# Patient Record
Sex: Male | Born: 1960 | Race: White | Hispanic: No | Marital: Married | Smoking: Never smoker
Health system: Southern US, Community
[De-identification: ages and names within clinical notes are randomized; demographics above are authoritative.]

## PROBLEM LIST (undated history)

## (undated) DIAGNOSIS — A630 Anogenital (venereal) warts: Secondary | ICD-10-CM

## (undated) DIAGNOSIS — G4733 Obstructive sleep apnea (adult) (pediatric): Secondary | ICD-10-CM

## (undated) DIAGNOSIS — R519 Headache, unspecified: Secondary | ICD-10-CM

## (undated) DIAGNOSIS — B019 Varicella without complication: Secondary | ICD-10-CM

## (undated) DIAGNOSIS — T7840XA Allergy, unspecified, initial encounter: Secondary | ICD-10-CM

## (undated) DIAGNOSIS — E291 Testicular hypofunction: Secondary | ICD-10-CM

## (undated) DIAGNOSIS — D126 Benign neoplasm of colon, unspecified: Secondary | ICD-10-CM

## (undated) DIAGNOSIS — K921 Melena: Secondary | ICD-10-CM

## (undated) DIAGNOSIS — J45909 Unspecified asthma, uncomplicated: Secondary | ICD-10-CM

## (undated) DIAGNOSIS — G4700 Insomnia, unspecified: Secondary | ICD-10-CM

## (undated) DIAGNOSIS — K635 Polyp of colon: Secondary | ICD-10-CM

## (undated) DIAGNOSIS — Z9989 Dependence on other enabling machines and devices: Secondary | ICD-10-CM

## (undated) DIAGNOSIS — I2699 Other pulmonary embolism without acute cor pulmonale: Secondary | ICD-10-CM

## (undated) DIAGNOSIS — E785 Hyperlipidemia, unspecified: Secondary | ICD-10-CM

## (undated) DIAGNOSIS — E119 Type 2 diabetes mellitus without complications: Secondary | ICD-10-CM

## (undated) DIAGNOSIS — K5790 Diverticulosis of intestine, part unspecified, without perforation or abscess without bleeding: Secondary | ICD-10-CM

## (undated) DIAGNOSIS — K649 Unspecified hemorrhoids: Secondary | ICD-10-CM

## (undated) DIAGNOSIS — K219 Gastro-esophageal reflux disease without esophagitis: Secondary | ICD-10-CM

## (undated) DIAGNOSIS — I10 Essential (primary) hypertension: Secondary | ICD-10-CM

## (undated) DIAGNOSIS — R51 Headache: Secondary | ICD-10-CM

## (undated) DIAGNOSIS — E079 Disorder of thyroid, unspecified: Secondary | ICD-10-CM

## (undated) HISTORY — DX: Varicella without complication: B01.9

## (undated) HISTORY — DX: Dependence on other enabling machines and devices: Z99.89

## (undated) HISTORY — DX: Allergy, unspecified, initial encounter: T78.40XA

## (undated) HISTORY — DX: Disorder of thyroid, unspecified: E07.9

## (undated) HISTORY — DX: Polyp of colon: K63.5

## (undated) HISTORY — DX: Gastro-esophageal reflux disease without esophagitis: K21.9

## (undated) HISTORY — PX: ANKLE SURGERY: SHX546

## (undated) HISTORY — DX: Other pulmonary embolism without acute cor pulmonale: I26.99

## (undated) HISTORY — DX: Benign neoplasm of colon, unspecified: D12.6

## (undated) HISTORY — DX: Type 2 diabetes mellitus without complications: E11.9

## (undated) HISTORY — PX: ESOPHAGOGASTRODUODENOSCOPY: SHX1529

## (undated) HISTORY — DX: Hyperlipidemia, unspecified: E78.5

## (undated) HISTORY — DX: Obstructive sleep apnea (adult) (pediatric): G47.33

## (undated) HISTORY — DX: Melena: K92.1

## (undated) HISTORY — DX: Insomnia, unspecified: G47.00

## (undated) HISTORY — DX: Diverticulosis of intestine, part unspecified, without perforation or abscess without bleeding: K57.90

## (undated) HISTORY — DX: Testicular hypofunction: E29.1

## (undated) HISTORY — DX: Anogenital (venereal) warts: A63.0

## (undated) HISTORY — DX: Headache, unspecified: R51.9

## (undated) HISTORY — DX: Headache: R51

## (undated) HISTORY — DX: Unspecified hemorrhoids: K64.9

---

## 2000-03-12 ENCOUNTER — Ambulatory Visit (HOSPITAL_BASED_OUTPATIENT_CLINIC_OR_DEPARTMENT_OTHER): Admission: RE | Admit: 2000-03-12 | Discharge: 2000-03-12 | Payer: Self-pay | Admitting: Orthopaedic Surgery

## 2004-03-14 ENCOUNTER — Ambulatory Visit (HOSPITAL_COMMUNITY): Admission: RE | Admit: 2004-03-14 | Discharge: 2004-03-14 | Payer: Self-pay | Admitting: Gastroenterology

## 2012-08-21 ENCOUNTER — Encounter (HOSPITAL_BASED_OUTPATIENT_CLINIC_OR_DEPARTMENT_OTHER): Payer: Self-pay | Admitting: Emergency Medicine

## 2012-08-21 ENCOUNTER — Emergency Department (HOSPITAL_BASED_OUTPATIENT_CLINIC_OR_DEPARTMENT_OTHER)
Admission: EM | Admit: 2012-08-21 | Discharge: 2012-08-22 | Disposition: A | Discharge status: Home / Self Care | Payer: BC Managed Care – PPO | Attending: Emergency Medicine | Admitting: Emergency Medicine

## 2012-08-21 DIAGNOSIS — K5732 Diverticulitis of large intestine without perforation or abscess without bleeding: Secondary | ICD-10-CM | POA: Insufficient documentation

## 2012-08-21 DIAGNOSIS — K5792 Diverticulitis of intestine, part unspecified, without perforation or abscess without bleeding: Secondary | ICD-10-CM

## 2012-08-21 DIAGNOSIS — I10 Essential (primary) hypertension: Secondary | ICD-10-CM | POA: Insufficient documentation

## 2012-08-21 DIAGNOSIS — Z79899 Other long term (current) drug therapy: Secondary | ICD-10-CM | POA: Insufficient documentation

## 2012-08-21 HISTORY — DX: Essential (primary) hypertension: I10

## 2012-08-21 MED ORDER — SODIUM CHLORIDE 0.9 % IV SOLN
INTRAVENOUS | Status: DC
  Administered 2012-08-22: via INTRAVENOUS

## 2012-08-21 NOTE — ED Notes (Signed)
Pt c/o LLQ abd pain x 5-6 hours with nausea. Denies vomiting and diarrhea today. Pt states he did have diarrhea the two previous days.

## 2012-08-21 NOTE — ED Notes (Signed)
Pt provided urinal to obtain urine specimen.  Pt requesting water but informed patient needed to wait until testing completed and ok'd by MD.

## 2012-08-21 NOTE — ED Provider Notes (Addendum)
History     CSN: [id number]  Arrival date & time 08/21/12  2341   First MD Initiated Contact with Patient 08/21/12 2353      Chief Complaint  Patient presents with  . Abdominal Pain    (Consider location/radiation/quality/duration/timing/severity/associated sxs/prior treatment) HPI This is a 52 year old male with a 5 or 6 hour history of left lower quadrant pain. He describes the pain is sharp and of fairly rapid onset. It is worse with movement or palpation. There is no associated chills but no fever. He denies dysuria or hematuria. He has some nausea but no vomiting. He did have diarrhea yesterday. The pain is about a 5/10 at this time.  Past Medical History  Diagnosis Date  . Hypertension     Past Surgical History  Procedure Date  . Ankle surgery     History reviewed. No pertinent family history.  History  Substance Use Topics  . Smoking status: Never Smoker   . Smokeless tobacco: Not on file  . Alcohol Use: Yes      Review of Systems  All other systems reviewed and are negative.    Allergies  Review of patient's allergies indicates no known allergies.  Home Medications   Current Outpatient Rx  Name  Route  Sig  Dispense  Refill  . BISMUTH SUBSALICYLATE 262 MG/15ML PO SUSP   Oral   Take 15 mLs by mouth as needed.         Marland Kitchen DM-GUAIFENESIN ER 30-600 MG PO TB12   Oral   Take 1 tablet by mouth as needed.         . IBUPROFEN 200 MG PO TABS   Oral   Take 200 mg by mouth once.         Marland Kitchen LISINOPRIL-HYDROCHLOROTHIAZIDE 20-12.5 MG PO TABS   Oral   Take 1 tablet by mouth daily.           BP 144/95  Pulse 90  Temp 98.5 F (36.9 C) (Oral)  Resp 18  Ht 6\' 1"  (1.854 m)  Wt 320 lb (145.151 kg)  BMI 42.22 kg/m2  SpO2 95%  Physical Exam General: Well-developed, well-nourished male in no acute distress; appearance consistent with age of record HENT: normocephalic, atraumatic Eyes: pupils equal round and reactive to light; extraocular muscles  intact Neck: supple Heart: regular rate and rhythm Lungs: clear to auscultation bilaterally Abdomen: soft; obese; left lower quadrant tenderness; bowel sounds present Extremities: No deformity; full range of motion Neurologic: Awake, alert and oriented; motor function intact in all extremities and symmetric; no facial droop Skin: Warm and dry Psychiatric: Normal mood and affect    ED Course  Procedures (including critical care time)    MDM   Nursing notes and vitals signs, including pulse oximetry, reviewed.  Summary of this visit's results, reviewed by myself:  Labs:  Results for orders placed during the hospital encounter of 08/21/12 (from the past 24 hour(s))  URINALYSIS, ROUTINE W REFLEX MICROSCOPIC     Status: Abnormal   Collection Time   08/22/12 12:02 AM      Component Value Range   Color, Urine YELLOW  YELLOW   APPearance CLEAR  CLEAR   Specific Gravity, Urine 1.029  1.005 - 1.030   pH 5.5  5.0 - 8.0   Glucose, UA NEGATIVE  NEGATIVE mg/dL   Hgb urine dipstick TRACE (*) NEGATIVE   Bilirubin Urine NEGATIVE  NEGATIVE   Ketones, ur NEGATIVE  NEGATIVE mg/dL   Protein, ur NEGATIVE  NEGATIVE mg/dL   Urobilinogen, UA 0.2  0.0 - 1.0 mg/dL   Nitrite NEGATIVE  NEGATIVE   Leukocytes, UA NEGATIVE  NEGATIVE  CBC WITH DIFFERENTIAL     Status: Abnormal   Collection Time   08/22/12 12:02 AM      Component Value Range   WBC 15.2 (*) 4.0 - 10.5 K/uL   RBC 5.07  4.22 - 5.81 MIL/uL   Hemoglobin 14.8  13.0 - 17.0 g/dL   HCT 16.1  09.6 - 04.5 %   MCV 87.4  78.0 - 100.0 fL   MCH 29.2  26.0 - 34.0 pg   MCHC 33.4  30.0 - 36.0 g/dL   RDW 40.9  81.1 - 91.4 %   Platelets 282  150 - 400 K/uL   Neutrophils Relative 75  43 - 77 %   Neutro Abs 11.4 (*) 1.7 - 7.7 K/uL   Lymphocytes Relative 13  12 - 46 %   Lymphs Abs 2.0  0.7 - 4.0 K/uL   Monocytes Relative 10  3 - 12 %   Monocytes Absolute 1.5 (*) 0.1 - 1.0 K/uL   Eosinophils Relative 2  0 - 5 %   Eosinophils Absolute 0.3  0.0 - 0.7  K/uL   Basophils Relative 0  0 - 1 %   Basophils Absolute 0.0  0.0 - 0.1 K/uL  BASIC METABOLIC PANEL     Status: Abnormal   Collection Time   08/22/12 12:02 AM      Component Value Range   Sodium 139  135 - 145 mEq/L   Potassium 4.3  3.5 - 5.1 mEq/L   Chloride 101  96 - 112 mEq/L   CO2 29  19 - 32 mEq/L   Glucose, Bld 144 (*) 70 - 99 mg/dL   BUN 24 (*) 6 - 23 mg/dL   Creatinine, Ser 7.82 (*) 0.50 - 1.35 mg/dL   Calcium 9.4  8.4 - 95.6 mg/dL   GFR calc non Af Amer 53 (*) >90 mL/min   GFR calc Af Amer 61 (*) >90 mL/min  URINE MICROSCOPIC-ADD ON     Status: Normal   Collection Time   08/22/12 12:02 AM      Component Value Range   Squamous Epithelial / LPF RARE  RARE   WBC, UA 3-6  <3 WBC/hpf   RBC / HPF 0-2  <3 RBC/hpf   Bacteria, UA RARE  RARE    Imaging Studies: Ct Abdomen Pelvis W Contrast  08/22/2012  *RADIOLOGY REPORT*  Clinical Data: Left lower quadrant pain.  CT ABDOMEN AND PELVIS WITH CONTRAST  Technique:  Multidetector CT imaging of the abdomen and pelvis was performed following the standard protocol during bolus administration of intravenous contrast.  Contrast: OMNIPAQUE IOHEXOL 300 MG/ML  SOLN, 50mL OMNIPAQUE IOHEXOL 300 MG/ML  SOLN  Comparison: None.  Findings: Linear opacity in the lower lobes, favor scarring or atelectasis. Coronary artery calcification.  Low attenuation of the liver suggests fatty infiltration. Unremarkable spleen, pancreas, biliary system, adrenal glands, kidneys.  No hydronephrosis or hydroureter.  Pericolonic fat stranding involving the descending/sigmoid colon junction.  Underlying diverticulosis.  Normal appendix.  Small bowel loops are normal course and caliber.  There is stranding of the small bowel mesentery.  No associated lymphadenopathy.  There is scattered atherosclerotic calcification of the aorta and its branches. No aneurysmal dilatation.  Partially decompressed bladder.  Bilateral fat containing inguinal hernias.  Multilevel degenerative  changes of the imaged spine. No acute or aggressive appearing  osseous lesion.  IMPRESSION: Descending/sigmoid colon junction diverticulitis.  No complicating features such as abscess or perforation. Recommend age appropriate colonoscopy follow-up if not yet performed to exclude an underlying lesion.  Stranding of the small bowel mesentery fat.  This is nonspecific, though often reflects sequelae of enteritis or sclerosing mesenteritis.  Lymphoma is also on the differential however this typically would be associated with lymphadenopathy.  Recommend comparison with prior imaging if available and attention with 12- month follow-up.  Hepatic steatosis.   Original Report Authenticated By: Jearld Lesch, M.D.    1:54 AM Advised of CT findings and need for followup. He is seen at the [facility] clinic in [place].          Hanley Seamen, MD 08/22/12 0150  Hanley Seamen, MD 08/22/12 1610

## 2012-08-21 NOTE — ED Notes (Signed)
MD at bedside. 

## 2012-08-22 ENCOUNTER — Emergency Department (HOSPITAL_BASED_OUTPATIENT_CLINIC_OR_DEPARTMENT_OTHER): Payer: BC Managed Care – PPO

## 2012-08-22 ENCOUNTER — Encounter (HOSPITAL_BASED_OUTPATIENT_CLINIC_OR_DEPARTMENT_OTHER): Payer: Self-pay | Admitting: Emergency Medicine

## 2012-08-22 LAB — CBC WITH DIFFERENTIAL/PLATELET
Basophils Absolute: 0 10*3/uL (ref 0.0–0.1)
Basophils Relative: 0 % (ref 0–1)
Eosinophils Absolute: 0.3 10*3/uL (ref 0.0–0.7)
Eosinophils Relative: 2 % (ref 0–5)
HCT: 44.3 % (ref 39.0–52.0)
Hemoglobin: 14.8 g/dL (ref 13.0–17.0)
Lymphocytes Relative: 13 % (ref 12–46)
Lymphs Abs: 2 10*3/uL (ref 0.7–4.0)
MCH: 29.2 pg (ref 26.0–34.0)
MCHC: 33.4 g/dL (ref 30.0–36.0)
MCV: 87.4 fL (ref 78.0–100.0)
Monocytes Absolute: 1.5 10*3/uL — ABNORMAL HIGH (ref 0.1–1.0)
Monocytes Relative: 10 % (ref 3–12)
Neutro Abs: 11.4 10*3/uL — ABNORMAL HIGH (ref 1.7–7.7)
Neutrophils Relative %: 75 % (ref 43–77)
Platelets: 282 10*3/uL (ref 150–400)
RBC: 5.07 MIL/uL (ref 4.22–5.81)
RDW: 12.9 % (ref 11.5–15.5)
WBC: 15.2 10*3/uL — ABNORMAL HIGH (ref 4.0–10.5)

## 2012-08-22 LAB — URINALYSIS, ROUTINE W REFLEX MICROSCOPIC
Bilirubin Urine: NEGATIVE
Glucose, UA: NEGATIVE mg/dL
Ketones, ur: NEGATIVE mg/dL
Leukocytes, UA: NEGATIVE
Nitrite: NEGATIVE
Protein, ur: NEGATIVE mg/dL
Specific Gravity, Urine: 1.029 (ref 1.005–1.030)
Urobilinogen, UA: 0.2 mg/dL (ref 0.0–1.0)
pH: 5.5 (ref 5.0–8.0)

## 2012-08-22 LAB — BASIC METABOLIC PANEL
BUN: 24 mg/dL — ABNORMAL HIGH (ref 6–23)
CO2: 29 mEq/L (ref 19–32)
Calcium: 9.4 mg/dL (ref 8.4–10.5)
Chloride: 101 mEq/L (ref 96–112)
Creatinine, Ser: 1.5 mg/dL — ABNORMAL HIGH (ref 0.50–1.35)
GFR calc Af Amer: 61 mL/min — ABNORMAL LOW (ref >90)
GFR calc non Af Amer: 53 mL/min — ABNORMAL LOW (ref >90)
Glucose, Bld: 144 mg/dL — ABNORMAL HIGH (ref 70–99)
Potassium: 4.3 mEq/L (ref 3.5–5.1)
Sodium: 139 mEq/L (ref 135–145)

## 2012-08-22 MED ORDER — IOHEXOL 300 MG/ML  SOLN
50.0000 mL | Freq: Once | INTRAMUSCULAR | Status: AC | PRN
  Administered 2012-08-22: 50 mL via ORAL

## 2012-08-22 MED ORDER — CIPROFLOXACIN HCL 500 MG PO TABS: 500.0000 mg | ORAL_TABLET | Freq: Two times a day (BID) | ORAL | Status: DC

## 2012-08-22 MED ORDER — METRONIDAZOLE IN NACL 5-0.79 MG/ML-% IV SOLN
500.0000 mg | Freq: Once | INTRAVENOUS | Status: AC
  Administered 2012-08-22: 500 mg via INTRAVENOUS
  Filled 2012-08-22: qty 100

## 2012-08-22 MED ORDER — HYDROCODONE-ACETAMINOPHEN 5-325 MG PO TABS: 1.0000 | ORAL_TABLET | Freq: Four times a day (QID) | ORAL | Status: DC | PRN

## 2012-08-22 MED ORDER — IOHEXOL 300 MG/ML  SOLN
100.0000 mL | Freq: Once | INTRAMUSCULAR | Status: AC | PRN
  Administered 2012-08-22: 100 mL via INTRAVENOUS

## 2012-08-22 MED ORDER — CIPROFLOXACIN HCL 500 MG PO TABS
500.0000 mg | ORAL_TABLET | Freq: Once | ORAL | Status: AC
  Administered 2012-08-22: 500 mg via ORAL
  Filled 2012-08-22: qty 1

## 2012-08-22 MED ORDER — METRONIDAZOLE 500 MG PO TABS: 500.0000 mg | ORAL_TABLET | Freq: Three times a day (TID) | ORAL | Status: DC

## 2012-08-22 NOTE — ED Notes (Signed)
Patient transported to CT 

## 2012-08-22 NOTE — ED Notes (Signed)
Patient returned from CT via stretcher.

## 2014-07-23 HISTORY — PX: COLONOSCOPY: SHX174

## 2014-11-19 ENCOUNTER — Other Ambulatory Visit: Payer: Self-pay | Admitting: Gastroenterology

## 2014-11-23 LAB — HM COLONOSCOPY

## 2015-10-15 ENCOUNTER — Emergency Department (HOSPITAL_BASED_OUTPATIENT_CLINIC_OR_DEPARTMENT_OTHER)
Admission: EM | Admit: 2015-10-15 | Discharge: 2015-10-16 | Disposition: A | Discharge status: Home / Self Care | Payer: Federal, State, Local not specified - PPO | Attending: Emergency Medicine | Admitting: Emergency Medicine

## 2015-10-15 ENCOUNTER — Emergency Department (HOSPITAL_BASED_OUTPATIENT_CLINIC_OR_DEPARTMENT_OTHER): Payer: Federal, State, Local not specified - PPO

## 2015-10-15 ENCOUNTER — Encounter (HOSPITAL_BASED_OUTPATIENT_CLINIC_OR_DEPARTMENT_OTHER): Payer: Self-pay | Admitting: Emergency Medicine

## 2015-10-15 DIAGNOSIS — S99912A Unspecified injury of left ankle, initial encounter: Secondary | ICD-10-CM | POA: Insufficient documentation

## 2015-10-15 DIAGNOSIS — Y9289 Other specified places as the place of occurrence of the external cause: Secondary | ICD-10-CM | POA: Insufficient documentation

## 2015-10-15 DIAGNOSIS — Y998 Other external cause status: Secondary | ICD-10-CM | POA: Insufficient documentation

## 2015-10-15 DIAGNOSIS — J45909 Unspecified asthma, uncomplicated: Secondary | ICD-10-CM | POA: Insufficient documentation

## 2015-10-15 DIAGNOSIS — W1839XA Other fall on same level, initial encounter: Secondary | ICD-10-CM | POA: Diagnosis not present

## 2015-10-15 DIAGNOSIS — Y9389 Activity, other specified: Secondary | ICD-10-CM | POA: Insufficient documentation

## 2015-10-15 DIAGNOSIS — I10 Essential (primary) hypertension: Secondary | ICD-10-CM | POA: Insufficient documentation

## 2015-10-15 HISTORY — DX: Unspecified asthma, uncomplicated: J45.909

## 2015-10-15 NOTE — ED Notes (Addendum)
Patient reports that he fell and hurt his left ankle. Swelling is noted.

## 2015-10-16 NOTE — ED Notes (Signed)
Patient left because of wait

## 2015-11-10 DIAGNOSIS — J4520 Mild intermittent asthma, uncomplicated: Secondary | ICD-10-CM | POA: Diagnosis not present

## 2015-11-10 DIAGNOSIS — E7800 Pure hypercholesterolemia, unspecified: Secondary | ICD-10-CM | POA: Diagnosis not present

## 2015-11-10 DIAGNOSIS — I10 Essential (primary) hypertension: Secondary | ICD-10-CM | POA: Diagnosis not present

## 2015-11-10 DIAGNOSIS — J301 Allergic rhinitis due to pollen: Secondary | ICD-10-CM | POA: Diagnosis not present

## 2015-11-14 DIAGNOSIS — R6882 Decreased libido: Secondary | ICD-10-CM | POA: Diagnosis not present

## 2015-11-14 DIAGNOSIS — R5383 Other fatigue: Secondary | ICD-10-CM | POA: Diagnosis not present

## 2015-11-14 DIAGNOSIS — E7800 Pure hypercholesterolemia, unspecified: Secondary | ICD-10-CM | POA: Diagnosis not present

## 2015-11-14 DIAGNOSIS — I10 Essential (primary) hypertension: Secondary | ICD-10-CM | POA: Diagnosis not present

## 2015-11-17 DIAGNOSIS — R946 Abnormal results of thyroid function studies: Secondary | ICD-10-CM | POA: Diagnosis not present

## 2015-11-17 DIAGNOSIS — E291 Testicular hypofunction: Secondary | ICD-10-CM | POA: Diagnosis not present

## 2015-11-17 LAB — LAB REPORT - SCANNED: Testosterone: 193.8

## 2015-12-15 DIAGNOSIS — H6982 Other specified disorders of Eustachian tube, left ear: Secondary | ICD-10-CM | POA: Diagnosis not present

## 2016-01-04 DIAGNOSIS — B356 Tinea cruris: Secondary | ICD-10-CM | POA: Diagnosis not present

## 2016-01-04 DIAGNOSIS — E038 Other specified hypothyroidism: Secondary | ICD-10-CM | POA: Diagnosis not present

## 2016-01-04 DIAGNOSIS — E291 Testicular hypofunction: Secondary | ICD-10-CM | POA: Diagnosis not present

## 2016-03-07 DIAGNOSIS — K080 Exfoliation of teeth due to systemic causes: Secondary | ICD-10-CM | POA: Diagnosis not present

## 2016-03-07 DIAGNOSIS — H2513 Age-related nuclear cataract, bilateral: Secondary | ICD-10-CM | POA: Diagnosis not present

## 2016-03-07 DIAGNOSIS — H43811 Vitreous degeneration, right eye: Secondary | ICD-10-CM | POA: Diagnosis not present

## 2016-05-17 DIAGNOSIS — Z23 Encounter for immunization: Secondary | ICD-10-CM | POA: Diagnosis not present

## 2016-08-21 DIAGNOSIS — I10 Essential (primary) hypertension: Secondary | ICD-10-CM | POA: Diagnosis not present

## 2016-08-21 DIAGNOSIS — E7800 Pure hypercholesterolemia, unspecified: Secondary | ICD-10-CM | POA: Diagnosis not present

## 2016-08-21 DIAGNOSIS — E038 Other specified hypothyroidism: Secondary | ICD-10-CM | POA: Diagnosis not present

## 2016-08-22 DIAGNOSIS — I10 Essential (primary) hypertension: Secondary | ICD-10-CM | POA: Diagnosis not present

## 2016-08-22 DIAGNOSIS — R7303 Prediabetes: Secondary | ICD-10-CM | POA: Diagnosis not present

## 2016-08-22 DIAGNOSIS — E038 Other specified hypothyroidism: Secondary | ICD-10-CM | POA: Diagnosis not present

## 2016-08-22 DIAGNOSIS — E7800 Pure hypercholesterolemia, unspecified: Secondary | ICD-10-CM | POA: Diagnosis not present

## 2016-08-22 LAB — BASIC METABOLIC PANEL
BUN: 23 — AB (ref 4–21)
Creatinine: 1 (ref 0.6–1.3)
Potassium: 4.5 (ref 3.4–5.3)
SODIUM: 139 (ref 137–147)

## 2016-08-22 LAB — TSH: TSH: 4.32 (ref <=5.90)

## 2016-09-18 DIAGNOSIS — Z9989 Dependence on other enabling machines and devices: Secondary | ICD-10-CM

## 2016-09-18 DIAGNOSIS — G4733 Obstructive sleep apnea (adult) (pediatric): Secondary | ICD-10-CM

## 2016-09-18 HISTORY — DX: Dependence on other enabling machines and devices: Z99.89

## 2016-09-18 HISTORY — DX: Obstructive sleep apnea (adult) (pediatric): G47.33

## 2016-09-20 DIAGNOSIS — G4733 Obstructive sleep apnea (adult) (pediatric): Secondary | ICD-10-CM | POA: Diagnosis not present

## 2016-10-02 DIAGNOSIS — G4733 Obstructive sleep apnea (adult) (pediatric): Secondary | ICD-10-CM | POA: Diagnosis not present

## 2016-10-04 DIAGNOSIS — K080 Exfoliation of teeth due to systemic causes: Secondary | ICD-10-CM | POA: Diagnosis not present

## 2016-10-08 DIAGNOSIS — E7800 Pure hypercholesterolemia, unspecified: Secondary | ICD-10-CM | POA: Diagnosis not present

## 2016-10-08 DIAGNOSIS — E119 Type 2 diabetes mellitus without complications: Secondary | ICD-10-CM | POA: Diagnosis not present

## 2016-10-18 DIAGNOSIS — H9209 Otalgia, unspecified ear: Secondary | ICD-10-CM | POA: Diagnosis not present

## 2016-11-02 DIAGNOSIS — G4733 Obstructive sleep apnea (adult) (pediatric): Secondary | ICD-10-CM | POA: Diagnosis not present

## 2016-11-09 DIAGNOSIS — G4733 Obstructive sleep apnea (adult) (pediatric): Secondary | ICD-10-CM | POA: Diagnosis not present

## 2016-12-02 DIAGNOSIS — G4733 Obstructive sleep apnea (adult) (pediatric): Secondary | ICD-10-CM | POA: Diagnosis not present

## 2016-12-31 DIAGNOSIS — G4733 Obstructive sleep apnea (adult) (pediatric): Secondary | ICD-10-CM | POA: Diagnosis not present

## 2017-01-02 DIAGNOSIS — G4733 Obstructive sleep apnea (adult) (pediatric): Secondary | ICD-10-CM | POA: Diagnosis not present

## 2017-01-20 DIAGNOSIS — I2699 Other pulmonary embolism without acute cor pulmonale: Secondary | ICD-10-CM

## 2017-01-20 DIAGNOSIS — E119 Type 2 diabetes mellitus without complications: Secondary | ICD-10-CM

## 2017-01-20 HISTORY — DX: Other pulmonary embolism without acute cor pulmonale: I26.99

## 2017-01-20 HISTORY — DX: Type 2 diabetes mellitus without complications: E11.9

## 2017-01-31 ENCOUNTER — Encounter (HOSPITAL_BASED_OUTPATIENT_CLINIC_OR_DEPARTMENT_OTHER): Payer: Self-pay | Admitting: Emergency Medicine

## 2017-01-31 ENCOUNTER — Inpatient Hospital Stay (HOSPITAL_BASED_OUTPATIENT_CLINIC_OR_DEPARTMENT_OTHER)
Admission: EM | Admit: 2017-01-31 | Discharge: 2017-02-03 | DRG: 175 | Disposition: A | Discharge status: Home / Self Care | Payer: Federal, State, Local not specified - PPO | Attending: Internal Medicine | Admitting: Internal Medicine

## 2017-01-31 ENCOUNTER — Emergency Department (HOSPITAL_BASED_OUTPATIENT_CLINIC_OR_DEPARTMENT_OTHER): Payer: Federal, State, Local not specified - PPO

## 2017-01-31 DIAGNOSIS — N183 Chronic kidney disease, stage 3 unspecified: Secondary | ICD-10-CM | POA: Diagnosis present

## 2017-01-31 DIAGNOSIS — E1165 Type 2 diabetes mellitus with hyperglycemia: Secondary | ICD-10-CM | POA: Diagnosis not present

## 2017-01-31 DIAGNOSIS — Z9989 Dependence on other enabling machines and devices: Secondary | ICD-10-CM | POA: Diagnosis not present

## 2017-01-31 DIAGNOSIS — E669 Obesity, unspecified: Secondary | ICD-10-CM | POA: Diagnosis not present

## 2017-01-31 DIAGNOSIS — I2729 Other secondary pulmonary hypertension: Secondary | ICD-10-CM | POA: Diagnosis present

## 2017-01-31 DIAGNOSIS — J9601 Acute respiratory failure with hypoxia: Secondary | ICD-10-CM | POA: Diagnosis not present

## 2017-01-31 DIAGNOSIS — G4733 Obstructive sleep apnea (adult) (pediatric): Secondary | ICD-10-CM | POA: Diagnosis not present

## 2017-01-31 DIAGNOSIS — E1169 Type 2 diabetes mellitus with other specified complication: Secondary | ICD-10-CM

## 2017-01-31 DIAGNOSIS — E1122 Type 2 diabetes mellitus with diabetic chronic kidney disease: Secondary | ICD-10-CM | POA: Diagnosis not present

## 2017-01-31 DIAGNOSIS — I129 Hypertensive chronic kidney disease with stage 1 through stage 4 chronic kidney disease, or unspecified chronic kidney disease: Secondary | ICD-10-CM | POA: Diagnosis not present

## 2017-01-31 DIAGNOSIS — I10 Essential (primary) hypertension: Secondary | ICD-10-CM | POA: Diagnosis present

## 2017-01-31 DIAGNOSIS — Z7951 Long term (current) use of inhaled steroids: Secondary | ICD-10-CM | POA: Diagnosis not present

## 2017-01-31 DIAGNOSIS — R197 Diarrhea, unspecified: Secondary | ICD-10-CM | POA: Diagnosis not present

## 2017-01-31 DIAGNOSIS — J45909 Unspecified asthma, uncomplicated: Secondary | ICD-10-CM | POA: Diagnosis not present

## 2017-01-31 DIAGNOSIS — Z6841 Body Mass Index (BMI) 40.0 and over, adult: Secondary | ICD-10-CM | POA: Diagnosis not present

## 2017-01-31 DIAGNOSIS — E119 Type 2 diabetes mellitus without complications: Secondary | ICD-10-CM

## 2017-01-31 DIAGNOSIS — R0902 Hypoxemia: Secondary | ICD-10-CM | POA: Diagnosis not present

## 2017-01-31 DIAGNOSIS — J9811 Atelectasis: Secondary | ICD-10-CM | POA: Diagnosis not present

## 2017-01-31 DIAGNOSIS — I2699 Other pulmonary embolism without acute cor pulmonale: Secondary | ICD-10-CM | POA: Diagnosis not present

## 2017-01-31 DIAGNOSIS — I272 Pulmonary hypertension, unspecified: Secondary | ICD-10-CM

## 2017-01-31 DIAGNOSIS — I361 Nonrheumatic tricuspid (valve) insufficiency: Secondary | ICD-10-CM | POA: Diagnosis not present

## 2017-01-31 DIAGNOSIS — Z7982 Long term (current) use of aspirin: Secondary | ICD-10-CM | POA: Diagnosis not present

## 2017-01-31 LAB — CBC WITH DIFFERENTIAL/PLATELET
Basophils Absolute: 0 10*3/uL (ref 0.0–0.1)
Basophils Relative: 0 %
EOS ABS: 0.1 10*3/uL (ref 0.0–0.7)
Eosinophils Relative: 1 %
HCT: 43.8 % (ref 39.0–52.0)
HEMOGLOBIN: 14.9 g/dL (ref 13.0–17.0)
LYMPHS PCT: 16 %
Lymphs Abs: 2 10*3/uL (ref 0.7–4.0)
MCH: 29.2 pg (ref 26.0–34.0)
MCHC: 34 g/dL (ref 30.0–36.0)
MCV: 85.9 fL (ref 78.0–100.0)
MONOS PCT: 7 %
Monocytes Absolute: 0.9 10*3/uL (ref 0.1–1.0)
NEUTROS PCT: 76 %
Neutro Abs: 9.7 10*3/uL — ABNORMAL HIGH (ref 1.7–7.7)
Platelets: 225 10*3/uL (ref 150–400)
RBC: 5.1 MIL/uL (ref 4.22–5.81)
RDW: 13.8 % (ref 11.5–15.5)
WBC: 12.8 10*3/uL — AB (ref 4.0–10.5)

## 2017-01-31 LAB — COMPREHENSIVE METABOLIC PANEL
ALK PHOS: 80 U/L (ref 38–126)
ALT: 40 U/L (ref 17–63)
ANION GAP: 13 (ref 5–15)
AST: 38 U/L (ref 15–41)
Albumin: 3.9 g/dL (ref 3.5–5.0)
BILIRUBIN TOTAL: 0.6 mg/dL (ref 0.3–1.2)
BUN: 19 mg/dL (ref 6–20)
CALCIUM: 8.9 mg/dL (ref 8.9–10.3)
CO2: 22 mmol/L (ref 22–32)
CREATININE: 1.42 mg/dL — AB (ref 0.61–1.24)
Chloride: 102 mmol/L (ref 101–111)
GFR calc non Af Amer: 54 mL/min — ABNORMAL LOW (ref >60)
Glucose, Bld: 142 mg/dL — ABNORMAL HIGH (ref 65–99)
Potassium: 3.8 mmol/L (ref 3.5–5.1)
SODIUM: 137 mmol/L (ref 135–145)
TOTAL PROTEIN: 7.9 g/dL (ref 6.5–8.1)

## 2017-01-31 LAB — TROPONIN I

## 2017-01-31 LAB — BRAIN NATRIURETIC PEPTIDE: B NATRIURETIC PEPTIDE 5: 46.3 pg/mL (ref 0.0–100.0)

## 2017-01-31 LAB — D-DIMER, QUANTITATIVE (NOT AT ARMC): D DIMER QUANT: 7.24 ug{FEU}/mL — AB (ref 0.00–0.50)

## 2017-01-31 MED ORDER — SODIUM CHLORIDE 0.9 % IV BOLUS (SEPSIS)
1000.0000 mL | Freq: Once | INTRAVENOUS | Status: AC
  Administered 2017-01-31: 1000 mL via INTRAVENOUS

## 2017-01-31 MED ORDER — IOPAMIDOL (ISOVUE-370) INJECTION 76%
150.0000 mL | Freq: Once | INTRAVENOUS | Status: AC | PRN
  Administered 2017-01-31: 150 mL via INTRAVENOUS

## 2017-01-31 MED ORDER — HEPARIN BOLUS VIA INFUSION
6000.0000 [IU] | Freq: Once | INTRAVENOUS | Status: AC
Start: 2017-01-31 — End: 2017-01-31
  Administered 2017-01-31: 6000 [IU] via INTRAVENOUS

## 2017-01-31 MED ORDER — HEPARIN (PORCINE) IN NACL 100-0.45 UNIT/ML-% IJ SOLN
1800.0000 [IU]/h | INTRAMUSCULAR | Status: DC
  Administered 2017-01-31: 1800 [IU]/h via INTRAVENOUS
  Filled 2017-01-31: qty 250

## 2017-01-31 MED ORDER — MORPHINE SULFATE (PF) 4 MG/ML IV SOLN
4.0000 mg | Freq: Once | INTRAVENOUS | Status: DC
  Filled 2017-01-31: qty 1

## 2017-01-31 NOTE — Progress Notes (Signed)
This is a no charge note  Transfer from [facility] per Dr. Oleta Mouse  56 year old male with a past medical history asthma, hypertension, CKD-3, OSA, who presents with right flank pain and shortness of breath.   Pt recently drove to and from [place] for vacation one week ago. Pt reports that his symptoms began while on his trip, worsened significantly tonight. He has intermittent upper back and right flank pain, which is pleuritic, and exacerbated by deep inspiration. Pt's wife also notes right lower leg swelling, but pt is unsure of any leg swelling.  Patient was found to have positive d-dimer 7.24, WBC 12.8, negative troponin, BNP 46.3, stable renal function, temperature normal, tachycardia, tachypnea, oxygen saturation 89% on room air. Chest x-ray showed vascular congestion without infiltration. CT angiogram showed bilateral PE and evidence of right heart strain. Patient is admitted to telemetry bed as inpatient. IV heparin is started.   Please call manager or [facility] at [phone] when pt arrives to floor   Ivor Costa, MD  [facility] Pager [phone]  If 7PM-7AM, please contact night-coverage [email or web address] Password [email or web address] 01/31/2017, 11:01 PM

## 2017-01-31 NOTE — ED Notes (Signed)
ED Provider at bedside. 

## 2017-01-31 NOTE — Progress Notes (Signed)
ANTICOAGULATION CONSULT NOTE - Initial Consult  Pharmacy Consult for heparin  Indication: PE  No Known Allergies  Patient Measurements: Height: 6' (182.9 cm) Weight: (!) 351 lb (159.2 kg) IBW/kg (Calculated) : 77.6 Heparin Dosing Weight: 115kg  Vital Signs: Temp: 98.1 F (36.7 C) (07/12 2032) Temp Source: Oral (07/12 2032) BP: 136/87 (07/12 2211) Pulse Rate: 95 (07/12 2211)  Labs:  Recent Labs  01/31/17 2036  HGB 14.9  HCT 43.8  PLT 225  CREATININE 1.42*  TROPONINI <0.03    Estimated Creatinine Clearance: 91.6 mL/min (A) (by C-G formula based on SCr of 1.42 mg/dL (H)).   Medical History: Past Medical History:  Diagnosis Date  . Asthma   . Hypertension     Assessment: 56 yo male with bilateral PE to begin heparin per pharmacy. No anticoagulants noted PTA. CBC is WNL.    Goal of Therapy:  Heparin level 0.3-0.7 units/ml Monitor platelets by anticoagulation protocol: Yes   Plan:  -Heparin bolus 6000 units IV followed by 1800 units/hr (~16  units/kg/hr) -Heparin level in 6 hours and daily wth CBC daily  Hildred Laser, Pharm D 01/31/2017 10:33 PM

## 2017-01-31 NOTE — ED Provider Notes (Signed)
[place] DEPT [facility] Provider Note   CSN: [id number] Arrival date & time: 01/31/17  2025 By signing my name below, I, Dyke Brackett, attest that this documentation has been prepared under the direction and in the presence of Estelene Carmack, Eugenia Mcalpine, MD . Electronically Signed: Dyke Brackett, Scribe. 01/31/2017. 8:50 PM.   History   Chief Complaint Chief Complaint  Patient presents with  . Shortness of Breath   HPI DOV DILL is a 56 y.o. male with a history of sleep apnea, asthma and HTN who presents to the Emergency Department complaining of progressively worsening, severe shortness of breath which began one week ago and worsened significantly tonight. He reports associated aching, intermittent upper back pain. Pt's wife also notes right lower leg swelling, but pt is unsure of any leg swelling. Pt recently drove to and from [place], and reports that his symptoms began while on his trip.Shortness of breath is exacerbated by exertion; back pain is exacerbated by deep inspiration. Pt expresses that is unable to take deep breaths or perform daily activities, such as washing dishes.  No personal or known family history of PE, DVT or cardiac disease.  Pt denies any syncope, chest pain, cough, fever, or chills.  The history is provided by the patient. No language interpreter was used.    Past Medical History:  Diagnosis Date  . Asthma   . Hypertension     Patient Active Problem List   Diagnosis Date Noted  . PE (pulmonary thromboembolism) ([place]) 01/31/2017  . CKD (chronic kidney disease), stage III 01/31/2017  . Acute respiratory failure with hypoxia ([place]) 01/31/2017  . Hypertension   . Asthma     Past Surgical History:  Procedure Laterality Date  . ANKLE SURGERY         Home Medications    Prior to Admission medications   Medication Sig Start Date End Date Taking? Authorizing Provider  aspirin EC 81 MG tablet Take 81 mg by mouth daily.   Yes [provider]    losartan-hydrochlorothiazide (HYZAAR) 100-25 MG tablet Take 1 tablet by mouth daily.   Yes [provider]  mometasone (ASMANEX) 220 MCG/INH inhaler Inhale 2 puffs into the lungs daily.   Yes [provider]  albuterol (PROVENTIL HFA;VENTOLIN HFA) 108 (90 Base) MCG/ACT inhaler Inhale into the lungs every 6 (six) hours as needed for wheezing or shortness of breath.    [provider]  bismuth subsalicylate (PEPTO BISMOL) 262 MG/15ML suspension Take 15 mLs by mouth as needed.    [provider]  ciprofloxacin (CIPRO) 500 MG tablet Take 1 tablet (500 mg total) by mouth every 12 (twelve) hours. 08/22/12   Molpus, John, MD  dextromethorphan-guaiFENesin [facility] DM) 30-600 MG per 12 hr tablet Take 1 tablet by mouth as needed.    [provider]  fluticasone (FLONASE) 50 MCG/ACT nasal spray Place 1 spray into both nostrils daily.    [provider]  HYDROcodone-acetaminophen (NORCO/VICODIN) 5-325 MG per tablet Take 1-2 tablets by mouth every 6 (six) hours as needed for pain. 08/22/12   Molpus, John, MD  ibuprofen (ADVIL,MOTRIN) 200 MG tablet Take 200 mg by mouth once.    [provider]  lisinopril-hydrochlorothiazide (PRINZIDE,ZESTORETIC) 20-12.5 MG per tablet Take 1 tablet by mouth daily.    [provider]  metroNIDAZOLE (FLAGYL) 500 MG tablet Take 1 tablet (500 mg total) by mouth 3 (three) times daily. 08/22/12   Molpus, Jenny Reichmann, MD    Family History History reviewed. No pertinent family history.  Social History Social History  Substance Use Topics  . Smoking status: Never Smoker  . Smokeless tobacco: Never Used  . Alcohol use Yes     Allergies   Patient has no known allergies.   Review of Systems Review of Systems  Constitutional: Negative for chills and fever.  Respiratory: Positive for shortness of breath. Negative for cough.   Cardiovascular: Positive for leg swelling. Negative for chest pain.  Musculoskeletal:  Positive for back pain.  Neurological: Negative for syncope.  All other systems reviewed and are negative.  Physical Exam Updated Vital Signs BP 136/68 (BP Location: Right Arm)   Pulse 89   Temp 98.1 F (36.7 C) (Oral)   Resp (!) 26   Ht 6' (1.829 m)   Wt (!) 159.2 kg (351 lb)   SpO2 92%   BMI 47.60 kg/m   Physical Exam Physical Exam  Nursing note and vitals reviewed. Constitutional: Well developed, well nourished, non-toxic, and in no acute distress Head: Normocephalic and atraumatic.  Mouth/Throat: Oropharynx is clear and moist.  Neck: Normal range of motion. Neck supple.  Cardiovascular: Near tachycardic rate and regular rhythm.   Pulmonary/Chest: Mild tachypnea and breath sounds normal.  Abdominal: Soft. There is no tenderness. There is no rebound and no guarding.  Musculoskeletal: Normal range of motion. Trace bilateral pedal edema.  Neurological: Alert, no facial droop, fluent speech, moves all extremities symmetrically Skin: Skin is warm and dry.  Psychiatric: Cooperative   ED Treatments / Results  DIAGNOSTIC STUDIES:  Oxygen Saturation is 90% on RA, low by my interpretation.    COORDINATION OF CARE:  8:49 PM Will order DG chest, CMP, CBC, D-dimer, Troponin and brain natriuretic peptide. Discussed treatment plan with pt at bedside and pt agreed to plan.   Labs (all labs ordered are listed, but only abnormal results are displayed) Labs Reviewed  CBC WITH DIFFERENTIAL/PLATELET - Abnormal; Notable for the following:       Result Value   WBC 12.8 (*)    Neutro Abs 9.7 (*)    All other components within normal limits  COMPREHENSIVE METABOLIC PANEL - Abnormal; Notable for the following:    Glucose, Bld 142 (*)    Creatinine, Ser 1.42 (*)    GFR calc non Af Amer 54 (*)    All other components within normal limits  D-DIMER, QUANTITATIVE (NOT AT [facility]) - Abnormal; Notable for the following:    D-Dimer, Quant 7.24 (*)    All other components within normal limits    TROPONIN I  BRAIN NATRIURETIC PEPTIDE  HEPARIN LEVEL (UNFRACTIONATED)  CBC  PROTIME-INR    EKG  EKG Interpretation None       Radiology Dg Chest 2 View  Result Date: 01/31/2017 CLINICAL DATA:  Back and flank pain, shortness of breath. EXAM: CHEST  2 VIEW COMPARISON:  None. FINDINGS: Cardiac silhouette is upper limits of normal in size, mediastinal silhouette is nonsuspicious, mildly calcified aortic knob. Pulmonary vascular congestion, mildly elevated RIGHT hemidiaphragm with bibasilar strandy densities. No pleural effusion or focal consolidation. No pneumothorax. Mild degenerative change of the thoracic spine. IMPRESSION: Borderline cardiomegaly and pulmonary vascular congestion. Bibasilar atelectasis. Electronically Signed   By: Elon Alas M.D.   On: 01/31/2017 21:44   Ct Angio Chest Pe W And/or Wo Contrast  Result Date: 01/31/2017 CLINICAL DATA:  Pleuritic chest pain, RIGHT upper back and flank pain after recent travel. Hypoxia. History of asthma. EXAM: CT ANGIOGRAPHY CHEST WITH CONTRAST TECHNIQUE: Multidetector CT imaging of the chest was  performed using the standard protocol during bolus administration of intravenous contrast. Multiplanar CT image reconstructions and MIPs were obtained to evaluate the vascular anatomy. CONTRAST:  75 cc Isovue 370 COMPARISON:  Chest radiograph January 31, 2017 at 2035 hours FINDINGS: CARDIOVASCULAR: Adequate contrast opacification of the pulmonary artery's. Main pulmonary artery is mildly enlarged at 3.4 cm. Central filling defects LEFT upper lobe segmental to subsegmental branches, lingula segmental branches and LEFT lower lobe lobar artery extending to subsegmental branches with occlusion. Occlusive RIGHT upper lobar pulmonary infarct artery embolism, nonocclusive emboli RIGHT lower lobar artery calf dating to segmental branches. Occlusive RIGHT middle lobar pulmonary embolus. Heart size is upper limits of normal, RIGHT heart strain (RV/LV -= 1.1).  No pericardial effusion. Thoracic aorta is normal course and caliber, trace calcific atherosclerosis. MEDIASTINUM/NODES: No lymphadenopathy by CT size criteria. LUNGS/PLEURA: Tracheobronchial tree is patent, no pneumothorax. Bibasilar atelectasis. No focal consolidation/infarcts. No pleural effusion. UPPER ABDOMEN: Included view of the abdomen is nonacute. Elevated RIGHT hemidiaphragm. Mild hepatomegaly suspected, incompletely assessed. MUSCULOSKELETAL: Visualized soft tissues and included osseous structures are nonacute. Moderate degenerative change of the thoracic spine. Review of the MIP images confirms the above findings. IMPRESSION: 1. Acute bilateral pulmonary emboli, occlusive in RIGHT middle and RIGHT upper lobar artery's. CT evidence of right heart strain (RV/LV Ratio = 1.1) consistent with at least submassive (intermediate risk) PE. The presence of right heart strain has been associated with an increased risk of morbidity and mortality. Please activate Code PE by paging [phone]. 2. Atelectasis without pulmonary infarct. 3. Critical Value/emergent results were called by telephone at the time of interpretation on 01/31/2017 at 10:19 pm to Dr. Brantley Stage , who verbally acknowledged these results. Aortic Atherosclerosis (ICD10-I70.0). Electronically Signed   By: Elon Alas M.D.   On: 01/31/2017 22:24    Procedures Procedures (including critical care time) CRITICAL CARE Performed by: Forde Dandy   Total critical care time: 35 minutes  Critical care time was exclusive of separately billable procedures and treating other patients.  Critical care was necessary to treat or prevent imminent or life-threatening deterioration.  Critical care was time spent personally by me on the following activities: development of treatment plan with patient and/or surrogate as well as nursing, discussions with consultants, evaluation of patient's response to treatment, examination of patient, obtaining  history from patient or surrogate, ordering and performing treatments and interventions, ordering and review of laboratory studies, ordering and review of radiographic studies, pulse oximetry and re-evaluation of patient's condition.  Medications Ordered in ED Medications  morphine 4 MG/ML injection 4 mg (4 mg Intravenous Refused 01/31/17 2130)  heparin ADULT infusion 100 units/mL (25000 units/250mL sodium chloride 0.45%) (1,800 Units/hr Intravenous New Bag/Given 01/31/17 2254)  sodium chloride 0.9 % bolus 1,000 mL (0 mLs Intravenous Stopped 01/31/17 2254)  iopamidol (ISOVUE-370) 76 % injection 150 mL (150 mLs Intravenous Contrast Given 01/31/17 2152)  heparin bolus via infusion 6,000 Units (6,000 Units Intravenous Bolus from Bag 01/31/17 2253)     Initial Impression / Assessment and Plan / ED Course  I have reviewed the triage vital signs and the nursing notes.  Pertinent labs & imaging results that were available during my care of the patient were reviewed by me and considered in my medical decision making (see chart for details).    56 year old male who presents with shortness of breath. Presentation is hypoxic on room air to 89%. Placed on 2 L nasal cannula, with improvement in breathing and oxygen saturation. He has mild tachycardia but  normotensive.  Chest x-ray visualized and shows no evidence of acute cardiopulmonary processes. Concern for PE given her recent traveling, and d-dimer is significantly elevated. CT angiogram of the chest was obtained, visualized, reviewed with radiology. Evidence of bilateral pulmonary emboli, with occlusive thrombus in the right upper and right middle lobes. There is evidence of right heart strain. He does have normal troponin and BNP. EKG does show some anterior T-wave changes.   He is started on heparin drip. Discussed with Dr. Blaine Hamper who will admit to [facility].  Final Clinical Impressions(s) / ED Diagnoses   Final diagnoses:  Bilateral  pulmonary embolism ([place])  Hypoxia    New Prescriptions New Prescriptions   No medications on file      Forde Dandy, MD 01/31/17 2343

## 2017-01-31 NOTE — ED Triage Notes (Signed)
Patient states that he went to [facility] and back, a 12 hour drive. While in [place] the patient started to have some SOB - The patient comes to triage with slightly blue nail beds and RA sats of 87-89 %   Reports that he is having right side upper back to flank pain with deep breath. Patient brought straight to room 6 for evaluation. - 02 at 2 liters started.

## 2017-02-01 ENCOUNTER — Encounter (HOSPITAL_COMMUNITY): Payer: Self-pay | Admitting: Internal Medicine

## 2017-02-01 ENCOUNTER — Inpatient Hospital Stay (HOSPITAL_COMMUNITY): Payer: Federal, State, Local not specified - PPO

## 2017-02-01 DIAGNOSIS — J45909 Unspecified asthma, uncomplicated: Secondary | ICD-10-CM

## 2017-02-01 DIAGNOSIS — N183 Chronic kidney disease, stage 3 (moderate): Secondary | ICD-10-CM

## 2017-02-01 DIAGNOSIS — G4733 Obstructive sleep apnea (adult) (pediatric): Secondary | ICD-10-CM

## 2017-02-01 DIAGNOSIS — J9601 Acute respiratory failure with hypoxia: Secondary | ICD-10-CM

## 2017-02-01 DIAGNOSIS — I10 Essential (primary) hypertension: Secondary | ICD-10-CM

## 2017-02-01 DIAGNOSIS — I361 Nonrheumatic tricuspid (valve) insufficiency: Secondary | ICD-10-CM

## 2017-02-01 DIAGNOSIS — I2699 Other pulmonary embolism without acute cor pulmonale: Principal | ICD-10-CM

## 2017-02-01 DIAGNOSIS — Z9989 Dependence on other enabling machines and devices: Secondary | ICD-10-CM

## 2017-02-01 DIAGNOSIS — R197 Diarrhea, unspecified: Secondary | ICD-10-CM | POA: Diagnosis present

## 2017-02-01 LAB — BASIC METABOLIC PANEL
Anion gap: 9 (ref 5–15)
BUN: 18 mg/dL (ref 6–20)
CHLORIDE: 107 mmol/L (ref 101–111)
CO2: 23 mmol/L (ref 22–32)
CREATININE: 1.07 mg/dL (ref 0.61–1.24)
Calcium: 8.7 mg/dL — ABNORMAL LOW (ref 8.9–10.3)
GFR calc Af Amer: >60 mL/min (ref >60)
GFR calc non Af Amer: >60 mL/min (ref >60)
GLUCOSE: 133 mg/dL — AB (ref 65–99)
POTASSIUM: 3.6 mmol/L (ref 3.5–5.1)
SODIUM: 139 mmol/L (ref 135–145)

## 2017-02-01 LAB — PROTIME-INR
INR: 1.12
PROTHROMBIN TIME: 14.4 s (ref 11.4–15.2)

## 2017-02-01 LAB — TROPONIN I

## 2017-02-01 LAB — HEPARIN LEVEL (UNFRACTIONATED): HEPARIN UNFRACTIONATED: 0.34 [IU]/mL (ref 0.30–0.70)

## 2017-02-01 LAB — ECHOCARDIOGRAM COMPLETE
Height: 73 in
WEIGHTICAEL: 5616 [oz_av]

## 2017-02-01 LAB — ANTITHROMBIN III: ANTITHROMB III FUNC: 86 % (ref 75–120)

## 2017-02-01 LAB — GLUCOSE, CAPILLARY: GLUCOSE-CAPILLARY: 143 mg/dL — AB (ref 65–99)

## 2017-02-01 MED ORDER — ASPIRIN EC 81 MG PO TBEC
81.0000 mg | DELAYED_RELEASE_TABLET | Freq: Every day | ORAL | Status: DC
  Administered 2017-02-01 – 2017-02-03 (×3): 81 mg via ORAL
  Filled 2017-02-01 (×3): qty 1

## 2017-02-01 MED ORDER — BUDESONIDE 0.25 MG/2ML IN SUSP
0.2500 mg | Freq: Two times a day (BID) | RESPIRATORY_TRACT | Status: DC
  Administered 2017-02-01 – 2017-02-03 (×5): 0.25 mg via RESPIRATORY_TRACT
  Filled 2017-02-01 (×5): qty 2

## 2017-02-01 MED ORDER — ACETAMINOPHEN 650 MG RE SUPP: 650.0000 mg | Freq: Four times a day (QID) | RECTAL | Status: DC | PRN

## 2017-02-01 MED ORDER — OXYCODONE-ACETAMINOPHEN 5-325 MG PO TABS
1.0000 | ORAL_TABLET | ORAL | Status: DC | PRN
  Administered 2017-02-01 (×2): 1 via ORAL
  Filled 2017-02-01 (×2): qty 1

## 2017-02-01 MED ORDER — ORAL CARE MOUTH RINSE
15.0000 mL | Freq: Two times a day (BID) | OROMUCOSAL | Status: DC
  Administered 2017-02-02 – 2017-02-03 (×2): 15 mL via OROMUCOSAL

## 2017-02-01 MED ORDER — DM-GUAIFENESIN ER 30-600 MG PO TB12: 1.0000 | ORAL_TABLET | Freq: Two times a day (BID) | ORAL | Status: DC | PRN

## 2017-02-01 MED ORDER — BISMUTH SUBSALICYLATE 262 MG/15ML PO SUSP
15.0000 mL | ORAL | Status: DC | PRN
  Administered 2017-02-01 (×3): 15 mL via ORAL
  Filled 2017-02-01: qty 236

## 2017-02-01 MED ORDER — PERFLUTREN LIPID MICROSPHERE
1.0000 mL | INTRAVENOUS | Status: AC | PRN
  Filled 2017-02-01: qty 10

## 2017-02-01 MED ORDER — RIVAROXABAN 15 MG PO TABS
15.0000 mg | ORAL_TABLET | Freq: Two times a day (BID) | ORAL | Status: DC
  Administered 2017-02-01 – 2017-02-03 (×5): 15 mg via ORAL
  Filled 2017-02-01 (×5): qty 1

## 2017-02-01 MED ORDER — PNEUMOCOCCAL VAC POLYVALENT 25 MCG/0.5ML IJ INJ
0.5000 mL | INJECTION | INTRAMUSCULAR | Status: AC
  Administered 2017-02-02: 0.5 mL via INTRAMUSCULAR
  Filled 2017-02-01: qty 0.5

## 2017-02-01 MED ORDER — FLUTICASONE PROPIONATE 50 MCG/ACT NA SUSP
1.0000 | Freq: Every day | NASAL | Status: DC
  Administered 2017-02-01 – 2017-02-02 (×2): 1 via NASAL
  Filled 2017-02-01: qty 16

## 2017-02-01 MED ORDER — HEPARIN (PORCINE) IN NACL 100-0.45 UNIT/ML-% IJ SOLN
2000.0000 [IU]/h | INTRAMUSCULAR | Status: DC
  Administered 2017-02-01: 2000 [IU]/h via INTRAVENOUS
  Filled 2017-02-01: qty 250

## 2017-02-01 MED ORDER — HYDROCHLOROTHIAZIDE 25 MG PO TABS
25.0000 mg | ORAL_TABLET | Freq: Every day | ORAL | Status: DC
  Administered 2017-02-01 – 2017-02-02 (×2): 25 mg via ORAL
  Filled 2017-02-01 (×2): qty 1

## 2017-02-01 MED ORDER — SODIUM CHLORIDE 0.9 % IV SOLN
INTRAVENOUS | Status: DC
  Administered 2017-02-01: 03:00:00 via INTRAVENOUS

## 2017-02-01 MED ORDER — SODIUM CHLORIDE 0.9% FLUSH
3.0000 mL | Freq: Two times a day (BID) | INTRAVENOUS | Status: DC
  Administered 2017-02-01 – 2017-02-02 (×3): 3 mL via INTRAVENOUS

## 2017-02-01 MED ORDER — HYDRALAZINE HCL 20 MG/ML IJ SOLN: 5.0000 mg | INTRAMUSCULAR | Status: DC | PRN

## 2017-02-01 MED ORDER — VITAMINS A & D EX OINT
TOPICAL_OINTMENT | CUTANEOUS | Status: AC
  Administered 2017-02-01: 02:00:00
  Filled 2017-02-01: qty 5

## 2017-02-01 MED ORDER — ZOLPIDEM TARTRATE 5 MG PO TABS: 5.0000 mg | ORAL_TABLET | Freq: Every evening | ORAL | Status: DC | PRN

## 2017-02-01 MED ORDER — ONDANSETRON HCL 4 MG/2ML IJ SOLN: 4.0000 mg | Freq: Three times a day (TID) | INTRAMUSCULAR | Status: DC | PRN

## 2017-02-01 MED ORDER — ACETAMINOPHEN 325 MG PO TABS
650.0000 mg | ORAL_TABLET | Freq: Four times a day (QID) | ORAL | Status: DC | PRN
  Administered 2017-02-02: 650 mg via ORAL
  Filled 2017-02-01: qty 2

## 2017-02-01 MED ORDER — LOSARTAN POTASSIUM-HCTZ 100-25 MG PO TABS: 1.0000 | ORAL_TABLET | Freq: Every day | ORAL | Status: DC

## 2017-02-01 MED ORDER — LOSARTAN POTASSIUM 50 MG PO TABS
100.0000 mg | ORAL_TABLET | Freq: Every day | ORAL | Status: DC
  Administered 2017-02-01: 100 mg via ORAL
  Filled 2017-02-01: qty 2

## 2017-02-01 MED ORDER — PERFLUTREN LIPID MICROSPHERE
1.0000 mL | INTRAVENOUS | Status: DC | PRN
  Filled 2017-02-01: qty 10

## 2017-02-01 MED ORDER — ALBUTEROL SULFATE (2.5 MG/3ML) 0.083% IN NEBU: 2.5000 mg | INHALATION_SOLUTION | RESPIRATORY_TRACT | Status: DC | PRN

## 2017-02-01 NOTE — Progress Notes (Signed)
  Echocardiogram 2D Echocardiogram with definity has been performed.  Darlina Sicilian M 02/01/2017, 2:18 PM

## 2017-02-01 NOTE — H&P (Signed)
History and Physical    Adrian [name] KNL:[id number] DOB: 12-24-1960 DOA: 01/31/2017  Referring MD/NP/PA:   PCP: Orpah Melter, MD   Patient coming from:  The patient is coming from home.  At baseline, pt is independent for most of ADL.   Chief Complaint: Shortness of breath and  right flank pain  HPI: Adrian [name] is a 56 y.o. male with medical history significant of hypertension, asthma, CKD-3, OSA on CPAP, who presents with shortness of breath in the right flank pain.  Pt recently drove to [place] 19 days ago and drove back from [place] one week ago for vacation.  Pt states that his SOB started soon after he arrived at home. It has worsened significantly tonight. He has intermittent right flank pain, which is sharp, mild, 1 or 2 out 10 in severity. It is pleuritic and exacerbated by deep inspiration. Patient does not have chest pain, fever or chills. No cough. He speaks in full sentence. Pt's wife also notes right lower leg swelling, but pt is unsure of any leg swelling. Patient reports mild diarrhea, which has been going on for almost a week. He used to have 3 bowel movements with loose stool, but today he only had 1 bowel movement. He is taking Pepto-Bismol. Patient doesn't have nausea, abdominal pain or vomiting. No symptoms of UTI or unilateral weakness.  ED Course: pt was found to have oxygen desaturation to 89% on room air, positive d-dimer 7.24, WBC 12.8, negative troponin, BNP 46.3, stable renal function, temperature normal, tachycardia, tachypnea, oxygen saturation 89% on room air. Chest x-ray showed vascular congestion without infiltration. CT angiogram showed bilateral PE and evidence of right heart strain. Patient is admitted to telemetry bed as inpatient. IV heparin is started  Review of Systems:   General: no fevers, chills, no changes in body weight, has fatigue HEENT: no blurry vision, hearing changes or sore throat Respiratory: has dyspnea, no coughing, wheezing CV:  no chest pain, no palpitations GI: no nausea, vomiting, abdominal pain, has diarrhea, no constipation GU: no dysuria, burning on urination, increased urinary frequency, hematuria  Ext: no leg edema Neuro: no unilateral weakness, numbness, or tingling, no vision change or hearing loss Skin: no rash, no skin tear. MSK: has right flank pain Heme: No easy bruising.  Travel history: No recent long distant travel.  Allergy:  Allergies  Allergen Reactions  . Statins     Past Medical History:  Diagnosis Date  . Asthma   . Hypertension     Past Surgical History:  Procedure Laterality Date  . ANKLE SURGERY      Social History:  reports that he has never smoked. He has never used smokeless tobacco. He reports that he drinks alcohol. He reports that he does not use drugs.  Family History:  Family History  Problem Relation Age of Onset  . Asthma Mother   . Allergies Brother      Prior to Admission medications   Medication Sig Start Date End Date Taking? Authorizing Provider  aspirin EC 81 MG tablet Take 81 mg by mouth daily.   Yes [provider]  losartan-hydrochlorothiazide (HYZAAR) 100-25 MG tablet Take 1 tablet by mouth daily.   Yes [provider]  mometasone (ASMANEX) 220 MCG/INH inhaler Inhale 2 puffs into the lungs daily.   Yes [provider]  albuterol (PROVENTIL HFA;VENTOLIN HFA) 108 (90 Base) MCG/ACT inhaler Inhale into the lungs every 6 (six) hours as needed for wheezing or shortness of breath.  [provider]  bismuth subsalicylate (PEPTO BISMOL) 262 MG/15ML suspension Take 15 mLs by mouth as needed.    [provider]  ciprofloxacin (CIPRO) 500 MG tablet Take 1 tablet (500 mg total) by mouth every 12 (twelve) hours. 08/22/12   Molpus, John, MD  dextromethorphan-guaiFENesin [facility] DM) 30-600 MG per 12 hr tablet Take 1 tablet by mouth as needed.    [provider]  fluticasone (FLONASE) 50 MCG/ACT nasal spray Place  1 spray into both nostrils daily.    [provider]  HYDROcodone-acetaminophen (NORCO/VICODIN) 5-325 MG per tablet Take 1-2 tablets by mouth every 6 (six) hours as needed for pain. 08/22/12   Molpus, John, MD  ibuprofen (ADVIL,MOTRIN) 200 MG tablet Take 200 mg by mouth once.    [provider]  lisinopril-hydrochlorothiazide (PRINZIDE,ZESTORETIC) 20-12.5 MG per tablet Take 1 tablet by mouth daily.    [provider]  metroNIDAZOLE (FLAGYL) 500 MG tablet Take 1 tablet (500 mg total) by mouth 3 (three) times daily. 08/22/12   Molpus, Jenny Reichmann, MD    Physical Exam: Vitals:   01/31/17 2300 01/31/17 2331 02/01/17 0118 02/01/17 0122  BP: 138/83 136/68  (!) 155/89  Pulse: 92 89  88  Resp: (!) 26     Temp:    99.7 F (37.6 C)  TempSrc:    Oral  SpO2: 92% 92%  98%  Weight:   (!) 159.2 kg (351 lb)   Height:   6\' 1"  (1.854 m)    General: Not in acute distress HEENT:       Eyes: PERRL, EOMI, no scleral icterus.       ENT: No discharge from the ears and nose, no pharynx injection, no tonsillar enlargement.        Neck: No JVD, no bruit, no mass felt. Heme: No neck lymph node enlargement. Cardiac: S1/S2, RRR, No murmurs, No gallops or rubs. Respiratory:  No rales, wheezing, rhonchi or rubs. GI: Soft, nondistended, nontender, no rebound pain, no organomegaly, BS present. GU: No hematuria Ext: No pitting leg edema bilaterally. 2+DP/PT pulse bilaterally. Musculoskeletal: No joint deformities, No joint redness or warmth, no limitation of ROM in spin. Skin: No rashes.  Neuro: Alert, oriented X3, cranial nerves II-XII grossly intact, moves all extremities normally Psych: Patient is not psychotic, no suicidal or hemocidal ideation.  Labs on Admission: I have personally reviewed following labs and imaging studies  CBC:  Recent Labs Lab 01/31/17 2036  WBC 12.8*  NEUTROABS 9.7*  HGB 14.9  HCT 43.8  MCV 85.9  PLT 974   Basic Metabolic Panel:  Recent Labs Lab  01/31/17 2036  NA 137  K 3.8  CL 102  CO2 22  GLUCOSE 142*  BUN 19  CREATININE 1.42*  CALCIUM 8.9   GFR: Estimated Creatinine Clearance: 92.8 mL/min (A) (by C-G formula based on SCr of 1.42 mg/dL (H)). Liver Function Tests:  Recent Labs Lab 01/31/17 2036  AST 38  ALT 40  ALKPHOS 80  BILITOT 0.6  PROT 7.9  ALBUMIN 3.9   No results for input(s): LIPASE, AMYLASE in the last 168 hours. No results for input(s): AMMONIA in the last 168 hours. Coagulation Profile: No results for input(s): INR, PROTIME in the last 168 hours. Cardiac Enzymes:  Recent Labs Lab 01/31/17 2036  TROPONINI <0.03   BNP (last 3 results) No results for input(s): PROBNP in the last 8760 hours. HbA1C: No results for input(s): HGBA1C in the last 72 hours. CBG: No results for input(s): GLUCAP in  the last 168 hours. Lipid Profile: No results for input(s): CHOL, HDL, LDLCALC, TRIG, CHOLHDL, LDLDIRECT in the last 72 hours. Thyroid Function Tests: No results for input(s): TSH, T4TOTAL, FREET4, T3FREE, THYROIDAB in the last 72 hours. Anemia Panel: No results for input(s): VITAMINB12, FOLATE, FERRITIN, TIBC, IRON, RETICCTPCT in the last 72 hours. Urine analysis:    Component Value Date/Time   COLORURINE YELLOW 08/22/2012 0002   APPEARANCEUR CLEAR 08/22/2012 0002   LABSPEC 1.029 08/22/2012 0002   PHURINE 5.5 08/22/2012 0002   GLUCOSEU NEGATIVE 08/22/2012 0002   HGBUR TRACE (A) 08/22/2012 0002   BILIRUBINUR NEGATIVE 08/22/2012 0002   KETONESUR NEGATIVE 08/22/2012 0002   PROTEINUR NEGATIVE 08/22/2012 0002   UROBILINOGEN 0.2 08/22/2012 0002   NITRITE NEGATIVE 08/22/2012 0002   LEUKOCYTESUR NEGATIVE 08/22/2012 0002   Sepsis Labs: @LABRCNTIP (procalcitonin:4,lacticidven:4) )No results found for this or any previous visit (from the past 240 hour(s)).   Radiological Exams on Admission: Dg Chest 2 View  Result Date: 01/31/2017 CLINICAL DATA:  Back and flank pain, shortness of breath. EXAM: CHEST  2  VIEW COMPARISON:  None. FINDINGS: Cardiac silhouette is upper limits of normal in size, mediastinal silhouette is nonsuspicious, mildly calcified aortic knob. Pulmonary vascular congestion, mildly elevated RIGHT hemidiaphragm with bibasilar strandy densities. No pleural effusion or focal consolidation. No pneumothorax. Mild degenerative change of the thoracic spine. IMPRESSION: Borderline cardiomegaly and pulmonary vascular congestion. Bibasilar atelectasis. Electronically Signed   By: Elon Alas M.D.   On: 01/31/2017 21:44   Ct Angio Chest Pe W And/or Wo Contrast  Result Date: 01/31/2017 CLINICAL DATA:  Pleuritic chest pain, RIGHT upper back and flank pain after recent travel. Hypoxia. History of asthma. EXAM: CT ANGIOGRAPHY CHEST WITH CONTRAST TECHNIQUE: Multidetector CT imaging of the chest was performed using the standard protocol during bolus administration of intravenous contrast. Multiplanar CT image reconstructions and MIPs were obtained to evaluate the vascular anatomy. CONTRAST:  75 cc Isovue 370 COMPARISON:  Chest radiograph January 31, 2017 at 2035 hours FINDINGS: CARDIOVASCULAR: Adequate contrast opacification of the pulmonary artery's. Main pulmonary artery is mildly enlarged at 3.4 cm. Central filling defects LEFT upper lobe segmental to subsegmental branches, lingula segmental branches and LEFT lower lobe lobar artery extending to subsegmental branches with occlusion. Occlusive RIGHT upper lobar pulmonary infarct artery embolism, nonocclusive emboli RIGHT lower lobar artery calf dating to segmental branches. Occlusive RIGHT middle lobar pulmonary embolus. Heart size is upper limits of normal, RIGHT heart strain (RV/LV -= 1.1). No pericardial effusion. Thoracic aorta is normal course and caliber, trace calcific atherosclerosis. MEDIASTINUM/NODES: No lymphadenopathy by CT size criteria. LUNGS/PLEURA: Tracheobronchial tree is patent, no pneumothorax. Bibasilar atelectasis. No focal  consolidation/infarcts. No pleural effusion. UPPER ABDOMEN: Included view of the abdomen is nonacute. Elevated RIGHT hemidiaphragm. Mild hepatomegaly suspected, incompletely assessed. MUSCULOSKELETAL: Visualized soft tissues and included osseous structures are nonacute. Moderate degenerative change of the thoracic spine. Review of the MIP images confirms the above findings. IMPRESSION: 1. Acute bilateral pulmonary emboli, occlusive in RIGHT middle and RIGHT upper lobar artery's. CT evidence of right heart strain (RV/LV Ratio = 1.1) consistent with at least submassive (intermediate risk) PE. The presence of right heart strain has been associated with an increased risk of morbidity and mortality. Please activate Code PE by paging [phone]. 2. Atelectasis without pulmonary infarct. 3. Critical Value/emergent results were called by telephone at the time of interpretation on 01/31/2017 at 10:19 pm to Dr. Brantley Stage , who verbally acknowledged these results. Aortic Atherosclerosis (ICD10-I70.0). Electronically Signed   By:  Elon Alas M.D.   On: 01/31/2017 22:24     EKG: Independently reviewed.  Sinus rhythm, QTC 476, LAD, T-wave inversion in V1-V5, poor R-wave progression.   Assessment/Plan Principal Problem:   PE (pulmonary thromboembolism) (HCC) Active Problems:   Hypertension   Asthma   CKD (chronic kidney disease), stage III   Acute respiratory failure with hypoxia (HCC)   OSA on CPAP   Diarrhea    Acute respiratory failure with hypoxia due to PE (pulmonary thromboembolism) ([place]): CT angiogram showed bilateral PE and evidence of right heart strain. Currently hemodynamically stable.  -admit to tele bed as inpt -heparin drip initiated -2D echocardiogram ordered -LE dopplers ordered to evaluate for DVT -trop x 3 -Hypercoag panel -pain control: When necessary Percocet  HTN: -Continue Hyzaar -IV hydralazine when necessary  Asthma: stable. No wheezing -Pulmicort nebulizer -When  necessary albuterol nebulizer  CKD (chronic kidney disease), stage III: stable. He has baseline creatinine 1.5 on 08/22/12, his creatinine is 1.42 on admission. -Follow-up renal function by BMP  OSA -on CPAP  Diarrhea: Has been improving. Had one bowel movement today. No nausea, or vomiting or abdominal pain. -Continue Pepto-Bismol -If symptoms worsening, wi;; check C. difficile PCR -IVF: NS 75 cc/h   DVT ppx: on IV Heparin    Code Status: Full code Family Communication: yes, patient's wife and daughter  at bed side Disposition Plan:  Anticipate discharge back to previous home environment Consults called:  none Admission status:  Inpatient/tele     Date of Service 02/01/2017    Ivor Costa [facility] Pager [phone]  If 7PM-7AM, please contact night-coverage [email or web address] Password [email or web address] 02/01/2017, 1:41 AM

## 2017-02-01 NOTE — Progress Notes (Signed)
ANTICOAGULATION CONSULT NOTE -   Pharmacy Consult for rivaroxaban Indication: PE  Allergies  Allergen Reactions  . Lisinopril-Hydrochlorothiazide Cough  . Statins Other (See Comments)    Causes pain/ foot trouble    Patient Measurements: Height: 6\' 1"  (185.4 cm) Weight: (!) 351 lb (159.2 kg) IBW/kg (Calculated) : 79.9 Heparin Dosing Weight: 115kg  Vital Signs: Temp: 98.5 F (36.9 C) (07/13 0608) Temp Source: Oral (07/13 0608) BP: 132/53 (07/13 0608) Pulse Rate: 75 (07/13 0713)  Labs:  Recent Labs  01/31/17 2036 02/01/17 0159 02/01/17 0435  HGB 14.9  --   --   HCT 43.8  --   --   PLT 225  --   --   LABPROT  --   --  14.4  INR  --   --  1.12  HEPARINUNFRC  --   --  0.34  CREATININE 1.42*  --  1.07  TROPONINI <0.03 <0.03 <0.03    Estimated Creatinine Clearance: 123.1 mL/min (by C-G formula based on SCr of 1.07 mg/dL).   Medical History: Past Medical History:  Diagnosis Date  . Asthma   . Hypertension     Assessment: 56 yo male with bilateral PE to begin heparin per pharmacy. No anticoagulants noted PTA. CBC is WNL.  Today, 7/13 0435 HL=0.34 at low end of therapeutic >drawn at 5.5 hrs vs 6 hours, no bleeding or infusion issues per RN.  7/13: Pt is being transitioned from IV heparin to Rivaroxaban  Goal of Therapy:  Heparin level 0.3-0.7 units/ml Monitor platelets by anticoagulation protocol: Yes   Plan:   Rivaroxaban 15 mg PO BID for 21 days, then rivaroxaban 20 mg PO daily   Monitor for signs and symptoms of bleeding  Patient education and information pack provided   Royetta Asal, PharmD, BCPS Pager [phone] 02/01/2017 10:21 AM

## 2017-02-01 NOTE — Progress Notes (Signed)
Xarelto and Eliquis for 30 days co pay is $50.00. MD and pt made aware.

## 2017-02-01 NOTE — Progress Notes (Addendum)
PROGRESS NOTE    Adrian [name]  BZJ:[id number] DOB: Nov 27, 1960 DOA: 01/31/2017 PCP: Orpah Melter, MD  Brief Narrative:Adrian [name] is a 56 y.o. male with medical history significant of hypertension, asthma, CKD-3, OSA on CPAP, who presented with shortness of breath in the right flank pain. Pt recently drove to [place] 19 days ago and drove back from [place] one week ago for vacation.  Pt states that his SOB started soon after he arrived at home In ED, ED Course: pt was found to be hypoxic, positive d-dimer 7.24, CT angiogram showed bilateral PE and evidence of right heart strain  Assessment & Plan:     Acute hypoxic resp failure -due to PE, also has underlying OSA and asthma -hemodynamically stable, stop IV heparin -transition to Xarelto today, discussed DOACs and warfarin with pt today  ACute Bilateral PE -as above -FU ECHO and Dopplers -risk factor: recent travel and sedentary lifestyle    Hypertension -continue HCTZ, hold ARB    Asthma -stable, nebs PRN    CKD (chronic kidney disease), stage 2 -creatinine in normal range now was 1.4 on admission -check bmet in am   OSA on CPAP  -continue CPAP    Hyperglycemia -check Hba1c  DVT prophylaxis:on anticoagulation Code Status: Full code Family Communication: wife at bedside Disposition Plan: Home in 1-2days if stable  Consultants:     Subjective: Breathing better  Objective: Vitals:   02/01/17 0118 02/01/17 0122 02/01/17 0608 02/01/17 0713  BP:  (!) 155/89 (!) 132/53   Pulse:  88 76 75  Resp:   (!) 22 (!) 21  Temp:  99.7 F (37.6 C) 98.5 F (36.9 C)   TempSrc:  Oral Oral   SpO2:  98% 95% 96%  Weight: (!) 159.2 kg (351 lb)     Height: 6\' 1"  (1.854 m)       Intake/Output Summary (Last 24 hours) at 02/01/17 1244 Last data filed at 02/01/17 0600  Gross per 24 hour  Intake           374.92 ml  Output                0 ml  Net           374.92 ml   Filed Weights   01/31/17 2030 02/01/17 0118    Weight: (!) 159.2 kg (351 lb) (!) 159.2 kg (351 lb)    Examination:  General exam: Appears calm and comfortable, no distress, obese Respiratory system: Clear to auscultation. Respiratory effort normal. Cardiovascular system: S1 & S2 heard, RRR. No JVD Gastrointestinal system: Abdomen is nondistended, soft and nontender. normal bowel sounds heard. Central nervous system: Alert and oriented. No focal neurological deficits. Extremities: Symmetric 5 x 5 power. Skin: No rashes, lesions or ulcers Psychiatry: Judgement and insight appear normal. Mood & affect appropriate.     Data Reviewed:   CBC:  Recent Labs Lab 01/31/17 2036  WBC 12.8*  NEUTROABS 9.7*  HGB 14.9  HCT 43.8  MCV 85.9  PLT 017   Basic Metabolic Panel:  Recent Labs Lab 01/31/17 2036 02/01/17 0435  NA 137 139  K 3.8 3.6  CL 102 107  CO2 22 23  GLUCOSE 142* 133*  BUN 19 18  CREATININE 1.42* 1.07  CALCIUM 8.9 8.7*   GFR: Estimated Creatinine Clearance: 123.1 mL/min (by C-G formula based on SCr of 1.07 mg/dL). Liver Function Tests:  Recent Labs Lab 01/31/17 2036  AST 38  ALT 40  ALKPHOS 80  BILITOT  0.6  PROT 7.9  ALBUMIN 3.9   No results for input(s): LIPASE, AMYLASE in the last 168 hours. No results for input(s): AMMONIA in the last 168 hours. Coagulation Profile:  Recent Labs Lab 02/01/17 0435  INR 1.12   Cardiac Enzymes:  Recent Labs Lab 01/31/17 2036 02/01/17 0159 02/01/17 0435  TROPONINI <0.03 <0.03 <0.03   BNP (last 3 results) No results for input(s): PROBNP in the last 8760 hours. HbA1C: No results for input(s): HGBA1C in the last 72 hours. CBG:  Recent Labs Lab 02/01/17 0736  GLUCAP 143*   Lipid Profile: No results for input(s): CHOL, HDL, LDLCALC, TRIG, CHOLHDL, LDLDIRECT in the last 72 hours. Thyroid Function Tests: No results for input(s): TSH, T4TOTAL, FREET4, T3FREE, THYROIDAB in the last 72 hours. Anemia Panel: No results for input(s): VITAMINB12, FOLATE,  FERRITIN, TIBC, IRON, RETICCTPCT in the last 72 hours. Urine analysis:    Component Value Date/Time   COLORURINE YELLOW 08/22/2012 0002   APPEARANCEUR CLEAR 08/22/2012 0002   LABSPEC 1.029 08/22/2012 0002   PHURINE 5.5 08/22/2012 0002   GLUCOSEU NEGATIVE 08/22/2012 0002   HGBUR TRACE (A) 08/22/2012 0002   BILIRUBINUR NEGATIVE 08/22/2012 0002   KETONESUR NEGATIVE 08/22/2012 0002   PROTEINUR NEGATIVE 08/22/2012 0002   UROBILINOGEN 0.2 08/22/2012 0002   NITRITE NEGATIVE 08/22/2012 0002   LEUKOCYTESUR NEGATIVE 08/22/2012 0002   Sepsis Labs: @LABRCNTIP (procalcitonin:4,lacticidven:4)  )No results found for this or any previous visit (from the past 240 hour(s)).       Radiology Studies: Dg Chest 2 View  Result Date: 01/31/2017 CLINICAL DATA:  Back and flank pain, shortness of breath. EXAM: CHEST  2 VIEW COMPARISON:  None. FINDINGS: Cardiac silhouette is upper limits of normal in size, mediastinal silhouette is nonsuspicious, mildly calcified aortic knob. Pulmonary vascular congestion, mildly elevated RIGHT hemidiaphragm with bibasilar strandy densities. No pleural effusion or focal consolidation. No pneumothorax. Mild degenerative change of the thoracic spine. IMPRESSION: Borderline cardiomegaly and pulmonary vascular congestion. Bibasilar atelectasis. Electronically Signed   By: Elon Alas M.D.   On: 01/31/2017 21:44   Ct Angio Chest Pe W And/or Wo Contrast  Result Date: 01/31/2017 CLINICAL DATA:  Pleuritic chest pain, RIGHT upper back and flank pain after recent travel. Hypoxia. History of asthma. EXAM: CT ANGIOGRAPHY CHEST WITH CONTRAST TECHNIQUE: Multidetector CT imaging of the chest was performed using the standard protocol during bolus administration of intravenous contrast. Multiplanar CT image reconstructions and MIPs were obtained to evaluate the vascular anatomy. CONTRAST:  75 cc Isovue 370 COMPARISON:  Chest radiograph January 31, 2017 at 2035 hours FINDINGS: CARDIOVASCULAR:  Adequate contrast opacification of the pulmonary artery's. Main pulmonary artery is mildly enlarged at 3.4 cm. Central filling defects LEFT upper lobe segmental to subsegmental branches, lingula segmental branches and LEFT lower lobe lobar artery extending to subsegmental branches with occlusion. Occlusive RIGHT upper lobar pulmonary infarct artery embolism, nonocclusive emboli RIGHT lower lobar artery calf dating to segmental branches. Occlusive RIGHT middle lobar pulmonary embolus. Heart size is upper limits of normal, RIGHT heart strain (RV/LV -= 1.1). No pericardial effusion. Thoracic aorta is normal course and caliber, trace calcific atherosclerosis. MEDIASTINUM/NODES: No lymphadenopathy by CT size criteria. LUNGS/PLEURA: Tracheobronchial tree is patent, no pneumothorax. Bibasilar atelectasis. No focal consolidation/infarcts. No pleural effusion. UPPER ABDOMEN: Included view of the abdomen is nonacute. Elevated RIGHT hemidiaphragm. Mild hepatomegaly suspected, incompletely assessed. MUSCULOSKELETAL: Visualized soft tissues and included osseous structures are nonacute. Moderate degenerative change of the thoracic spine. Review of the MIP images confirms the above findings.  IMPRESSION: 1. Acute bilateral pulmonary emboli, occlusive in RIGHT middle and RIGHT upper lobar artery's. CT evidence of right heart strain (RV/LV Ratio = 1.1) consistent with at least submassive (intermediate risk) PE. The presence of right heart strain has been associated with an increased risk of morbidity and mortality. Please activate Code PE by paging [phone]. 2. Atelectasis without pulmonary infarct. 3. Critical Value/emergent results were called by telephone at the time of interpretation on 01/31/2017 at 10:19 pm to Dr. Brantley Stage , who verbally acknowledged these results. Aortic Atherosclerosis (ICD10-I70.0). Electronically Signed   By: Elon Alas M.D.   On: 01/31/2017 22:24        Scheduled Meds: . aspirin EC  81 mg  Oral Daily  . budesonide  0.25 mg Nebulization BID  . fluticasone  1 spray Each Nare Daily  . hydrochlorothiazide  25 mg Oral Daily  . mouth rinse  15 mL Mouth Rinse BID  . [START ON 02/02/2017] pneumococcal 23 valent vaccine  0.5 mL Intramuscular Tomorrow-1000  . rivaroxaban  15 mg Oral BID WC  . sodium chloride flush  3 mL Intravenous Q12H   Continuous Infusions: . sodium chloride 10 mL/hr at 02/01/17 1213     LOS: 1 day    Time spent: 47min    Domenic Polite, MD [facility] Pager [phone]  If 7PM-7AM, please contact night-coverage [email or web address] Password [facility] 02/01/2017, 12:44 PM

## 2017-02-01 NOTE — ED Notes (Signed)
Attempted to call report to [place] .... She will call back per Network engineer.

## 2017-02-01 NOTE — Progress Notes (Signed)
ANTICOAGULATION CONSULT NOTE - f/u Consult  Pharmacy Consult for heparin  Indication: PE  Allergies  Allergen Reactions  . Lisinopril-Hydrochlorothiazide Cough  . Statins Other (See Comments)    Causes pain/ foot trouble    Patient Measurements: Height: 6\' 1"  (185.4 cm) Weight: (!) 351 lb (159.2 kg) IBW/kg (Calculated) : 79.9 Heparin Dosing Weight: 115kg  Vital Signs: Temp: 99.7 F (37.6 C) (07/13 0122) Temp Source: Oral (07/13 0122) BP: 155/89 (07/13 0122) Pulse Rate: 88 (07/13 0122)  Labs:  Recent Labs  01/31/17 2036 02/01/17 0159 02/01/17 0435  HGB 14.9  --   --   HCT 43.8  --   --   PLT 225  --   --   LABPROT  --   --  14.4  INR  --   --  1.12  HEPARINUNFRC  --   --  0.34  CREATININE 1.42*  --  1.07  TROPONINI <0.03 <0.03  --     Estimated Creatinine Clearance: 123.1 mL/min (by C-G formula based on SCr of 1.07 mg/dL).   Medical History: Past Medical History:  Diagnosis Date  . Asthma   . Hypertension     Assessment: 56 yo male with bilateral PE to begin heparin per pharmacy. No anticoagulants noted PTA. CBC is WNL.  Today, 7/13 0435 HL=0.34 at low end of therapeutic >drawn at 5.5 hrs vs 6 hours, no bleeding or infusion issues per RN.  Goal of Therapy:  Heparin level 0.3-0.7 units/ml Monitor platelets by anticoagulation protocol: Yes   Plan:  -Increase heparin drip to 2000 units/hr to ensure level does not fall below goal  (pt received large bolus that may be contributing to HL) -Heparin level in 6 hours and daily wth CBC daily  Dorrene German 02/01/2017 5:15 AM

## 2017-02-02 ENCOUNTER — Inpatient Hospital Stay (HOSPITAL_COMMUNITY): Payer: Federal, State, Local not specified - PPO

## 2017-02-02 DIAGNOSIS — E109 Type 1 diabetes mellitus without complications: Secondary | ICD-10-CM | POA: Insufficient documentation

## 2017-02-02 DIAGNOSIS — I272 Pulmonary hypertension, unspecified: Secondary | ICD-10-CM | POA: Diagnosis present

## 2017-02-02 DIAGNOSIS — I2699 Other pulmonary embolism without acute cor pulmonale: Secondary | ICD-10-CM

## 2017-02-02 DIAGNOSIS — E119 Type 2 diabetes mellitus without complications: Secondary | ICD-10-CM

## 2017-02-02 DIAGNOSIS — E1169 Type 2 diabetes mellitus with other specified complication: Secondary | ICD-10-CM

## 2017-02-02 LAB — CBC
HCT: 41.3 % (ref 39.0–52.0)
HEMOGLOBIN: 13.9 g/dL (ref 13.0–17.0)
MCH: 28.5 pg (ref 26.0–34.0)
MCHC: 33.7 g/dL (ref 30.0–36.0)
MCV: 84.8 fL (ref 78.0–100.0)
Platelets: 218 10*3/uL (ref 150–400)
RBC: 4.87 MIL/uL (ref 4.22–5.81)
RDW: 13.6 % (ref 11.5–15.5)
WBC: 9.7 10*3/uL (ref 4.0–10.5)

## 2017-02-02 LAB — BASIC METABOLIC PANEL
Anion gap: 7 (ref 5–15)
BUN: 14 mg/dL (ref 6–20)
CHLORIDE: 104 mmol/L (ref 101–111)
CO2: 25 mmol/L (ref 22–32)
Calcium: 8.8 mg/dL — ABNORMAL LOW (ref 8.9–10.3)
Creatinine, Ser: 0.91 mg/dL (ref 0.61–1.24)
GFR calc Af Amer: >60 mL/min (ref >60)
GFR calc non Af Amer: >60 mL/min (ref >60)
GLUCOSE: 124 mg/dL — AB (ref 65–99)
POTASSIUM: 3.8 mmol/L (ref 3.5–5.1)
Sodium: 136 mmol/L (ref 135–145)

## 2017-02-02 LAB — GLUCOSE, CAPILLARY: Glucose-Capillary: 140 mg/dL — ABNORMAL HIGH (ref 65–99)

## 2017-02-02 LAB — HEMOGLOBIN A1C
Hgb A1c MFr Bld: 6.5 % — ABNORMAL HIGH (ref 4.8–5.6)
Mean Plasma Glucose: 140 mg/dL

## 2017-02-02 LAB — HIV ANTIBODY (ROUTINE TESTING W REFLEX): HIV SCREEN 4TH GENERATION: NONREACTIVE

## 2017-02-02 NOTE — Progress Notes (Signed)
PROGRESS NOTE    Adrian [name]  NGE:[id number] DOB: Jun 12, 1961 DOA: 01/31/2017 PCP: Orpah Melter, MD  Brief Narrative:Adrian [name] is a 56 y.o. male with medical history significant of hypertension, asthma, CKD-3, OSA on CPAP, who presented with shortness of breath in the right flank pain. Pt recently drove to [place] 19 days ago and drove back from [place] one week ago for vacation.  Pt states that his SOB started soon after he arrived at home In ED: pt was found to be hypoxic, positive d-dimer 7.24, CT angiogram showed bilateral PE and evidence of right heart strain  Assessment & Plan:     Acute hypoxic resp failure -due to Acute PE, also has underlying OSA and asthma -hemodynamically stable, stopped IV heparin -transitioned to Xarelto, discussed DOACs and warfarin with pt  -ECHO notes mod to severe Pulm HTN and RV dysfunction, suspect significant component of his Pulm HTN is related to his severe OSA/OHS -needs repeat ECHO in 84months, needs Pulm FU -Ambulate   Acute Bilateral PE -as above -risk factor: recent travel and sedentary lifestyle    Hypertension -continue HCTZ, hold ARB    Asthma -stable, nebs PRN    CKD (chronic kidney disease), stage 2 -creatinine in normal range now was 1.4 on admission -stable now    Severe OSA   -continue CPAP    Hyperglycemia/New DM -Hba1c is 6.5  DVT prophylaxis:on anticoagulation Code Status: Full code Family Communication: wife at bedside Disposition Plan: Home in 1-2days if stable  Consultants:     Subjective: Feels ok, breathing better, no chest pain  Objective: Vitals:   02/01/17 2011 02/01/17 2042 02/01/17 2144 02/02/17 0813  BP:  (!) 148/90    Pulse:  79 81   Resp:  (!) 22 20   Temp:  99.1 F (37.3 C)    TempSrc:  Oral    SpO2: 93% 92% 97% 95%  Weight:      Height:        Intake/Output Summary (Last 24 hours) at 02/02/17 1106 Last data filed at 02/02/17 0800  Gross per 24 hour  Intake               240 ml  Output                0 ml  Net              240 ml   Filed Weights   01/31/17 2030 02/01/17 0118  Weight: (!) 159.2 kg (351 lb) (!) 159.2 kg (351 lb)    Examination:  Gen: Awake, Alert, Oriented X 2, no distress, Obese HEENT: PERRLA, Neck supple, no JVD Lungs: Good air movement bilaterally, decreased bs at bases CVS: RRR,No Gallops,Rubs or new Murmurs Abd: soft, Non tender, non distended, BS present Extremities: No Cyanosis, Clubbing or edema, trace edema Skin: no new rashes   Data Reviewed:   CBC:  Recent Labs Lab 01/31/17 2036 02/02/17 0444  WBC 12.8* 9.7  NEUTROABS 9.7*  --   HGB 14.9 13.9  HCT 43.8 41.3  MCV 85.9 84.8  PLT 225 401   Basic Metabolic Panel:  Recent Labs Lab 01/31/17 2036 02/01/17 0435 02/02/17 0444  NA 137 139 136  K 3.8 3.6 3.8  CL 102 107 104  CO2 22 23 25   GLUCOSE 142* 133* 124*  BUN 19 18 14   CREATININE 1.42* 1.07 0.91  CALCIUM 8.9 8.7* 8.8*   GFR: Estimated Creatinine Clearance: 144.8 mL/min (by C-G formula based on  SCr of 0.91 mg/dL). Liver Function Tests:  Recent Labs Lab 01/31/17 2036  AST 38  ALT 40  ALKPHOS 80  BILITOT 0.6  PROT 7.9  ALBUMIN 3.9   No results for input(s): LIPASE, AMYLASE in the last 168 hours. No results for input(s): AMMONIA in the last 168 hours. Coagulation Profile:  Recent Labs Lab 02/01/17 0435  INR 1.12   Cardiac Enzymes:  Recent Labs Lab 01/31/17 2036 02/01/17 0159 02/01/17 0435  TROPONINI <0.03 <0.03 <0.03   BNP (last 3 results) No results for input(s): PROBNP in the last 8760 hours. HbA1C:  Recent Labs  02/01/17 1029  HGBA1C 6.5*   CBG:  Recent Labs Lab 02/01/17 0736 02/02/17 0753  GLUCAP 143* 140*   Lipid Profile: No results for input(s): CHOL, HDL, LDLCALC, TRIG, CHOLHDL, LDLDIRECT in the last 72 hours. Thyroid Function Tests: No results for input(s): TSH, T4TOTAL, FREET4, T3FREE, THYROIDAB in the last 72 hours. Anemia Panel: No results for  input(s): VITAMINB12, FOLATE, FERRITIN, TIBC, IRON, RETICCTPCT in the last 72 hours. Urine analysis:    Component Value Date/Time   COLORURINE YELLOW 08/22/2012 0002   APPEARANCEUR CLEAR 08/22/2012 0002   LABSPEC 1.029 08/22/2012 0002   PHURINE 5.5 08/22/2012 0002   GLUCOSEU NEGATIVE 08/22/2012 0002   HGBUR TRACE (A) 08/22/2012 0002   BILIRUBINUR NEGATIVE 08/22/2012 0002   KETONESUR NEGATIVE 08/22/2012 0002   PROTEINUR NEGATIVE 08/22/2012 0002   UROBILINOGEN 0.2 08/22/2012 0002   NITRITE NEGATIVE 08/22/2012 0002   LEUKOCYTESUR NEGATIVE 08/22/2012 0002   Sepsis Labs: @LABRCNTIP (procalcitonin:4,lacticidven:4)  )No results found for this or any previous visit (from the past 240 hour(s)).       Radiology Studies: Dg Chest 2 View  Result Date: 01/31/2017 CLINICAL DATA:  Back and flank pain, shortness of breath. EXAM: CHEST  2 VIEW COMPARISON:  None. FINDINGS: Cardiac silhouette is upper limits of normal in size, mediastinal silhouette is nonsuspicious, mildly calcified aortic knob. Pulmonary vascular congestion, mildly elevated RIGHT hemidiaphragm with bibasilar strandy densities. No pleural effusion or focal consolidation. No pneumothorax. Mild degenerative change of the thoracic spine. IMPRESSION: Borderline cardiomegaly and pulmonary vascular congestion. Bibasilar atelectasis. Electronically Signed   By: Elon Alas M.D.   On: 01/31/2017 21:44   Ct Angio Chest Pe W And/or Wo Contrast  Result Date: 01/31/2017 CLINICAL DATA:  Pleuritic chest pain, RIGHT upper back and flank pain after recent travel. Hypoxia. History of asthma. EXAM: CT ANGIOGRAPHY CHEST WITH CONTRAST TECHNIQUE: Multidetector CT imaging of the chest was performed using the standard protocol during bolus administration of intravenous contrast. Multiplanar CT image reconstructions and MIPs were obtained to evaluate the vascular anatomy. CONTRAST:  75 cc Isovue 370 COMPARISON:  Chest radiograph January 31, 2017 at 2035  hours FINDINGS: CARDIOVASCULAR: Adequate contrast opacification of the pulmonary artery's. Main pulmonary artery is mildly enlarged at 3.4 cm. Central filling defects LEFT upper lobe segmental to subsegmental branches, lingula segmental branches and LEFT lower lobe lobar artery extending to subsegmental branches with occlusion. Occlusive RIGHT upper lobar pulmonary infarct artery embolism, nonocclusive emboli RIGHT lower lobar artery calf dating to segmental branches. Occlusive RIGHT middle lobar pulmonary embolus. Heart size is upper limits of normal, RIGHT heart strain (RV/LV -= 1.1). No pericardial effusion. Thoracic aorta is normal course and caliber, trace calcific atherosclerosis. MEDIASTINUM/NODES: No lymphadenopathy by CT size criteria. LUNGS/PLEURA: Tracheobronchial tree is patent, no pneumothorax. Bibasilar atelectasis. No focal consolidation/infarcts. No pleural effusion. UPPER ABDOMEN: Included view of the abdomen is nonacute. Elevated RIGHT hemidiaphragm. Mild hepatomegaly suspected,  incompletely assessed. MUSCULOSKELETAL: Visualized soft tissues and included osseous structures are nonacute. Moderate degenerative change of the thoracic spine. Review of the MIP images confirms the above findings. IMPRESSION: 1. Acute bilateral pulmonary emboli, occlusive in RIGHT middle and RIGHT upper lobar artery's. CT evidence of right heart strain (RV/LV Ratio = 1.1) consistent with at least submassive (intermediate risk) PE. The presence of right heart strain has been associated with an increased risk of morbidity and mortality. Please activate Code PE by paging [phone]. 2. Atelectasis without pulmonary infarct. 3. Critical Value/emergent results were called by telephone at the time of interpretation on 01/31/2017 at 10:19 pm to Dr. Brantley Stage , who verbally acknowledged these results. Aortic Atherosclerosis (ICD10-I70.0). Electronically Signed   By: Elon Alas M.D.   On: 01/31/2017 22:24         Scheduled Meds: . aspirin EC  81 mg Oral Daily  . budesonide  0.25 mg Nebulization BID  . fluticasone  1 spray Each Nare Daily  . hydrochlorothiazide  25 mg Oral Daily  . mouth rinse  15 mL Mouth Rinse BID  . pneumococcal 23 valent vaccine  0.5 mL Intramuscular Tomorrow-1000  . rivaroxaban  15 mg Oral BID WC  . sodium chloride flush  3 mL Intravenous Q12H   Continuous Infusions:    LOS: 2 days    Time spent: 51min    Domenic Polite, MD [facility] Pager [phone]  If 7PM-7AM, please contact night-coverage [email or web address] Password [email or web address] 02/02/2017, 11:06 AM

## 2017-02-02 NOTE — Progress Notes (Signed)
RT NOTE:  Pt has home CPAP @ bedside. Pt manages CPAP. Pt understands to call if RT needed.

## 2017-02-02 NOTE — Progress Notes (Addendum)
**  Preliminary report by tech**  Bilateral lower extremity venous duplex complete. There is evidence of acute deep vein thrombosis involving the posterior tibial veins of the left lower extremity. There is no evidence of superficial vein thrombosis involving the left lower extremity. There is no evidence of deep or superficial vein thrombosis involving the right lower extremity. There is no evidence of a Baker's cyst bilaterally. Results were given to the patient's nurse, Sallie.  02/02/17 10:32 AM Adrian [name] RVT

## 2017-02-03 LAB — CARDIOLIPIN ANTIBODIES, IGG, IGM, IGA: Anticardiolipin IgG: <9 GPL U/mL (ref 0–14)

## 2017-02-03 LAB — CBC
HCT: 41.1 % (ref 39.0–52.0)
HEMOGLOBIN: 13.8 g/dL (ref 13.0–17.0)
MCH: 28.8 pg (ref 26.0–34.0)
MCHC: 33.6 g/dL (ref 30.0–36.0)
MCV: 85.8 fL (ref 78.0–100.0)
Platelets: 250 10*3/uL (ref 150–400)
RBC: 4.79 MIL/uL (ref 4.22–5.81)
RDW: 13.6 % (ref 11.5–15.5)
WBC: 11.8 10*3/uL — AB (ref 4.0–10.5)

## 2017-02-03 LAB — GLUCOSE, CAPILLARY: GLUCOSE-CAPILLARY: 118 mg/dL — AB (ref 65–99)

## 2017-02-03 MED ORDER — HYDROCHLOROTHIAZIDE 25 MG PO TABS: 25.0000 mg | ORAL_TABLET | Freq: Every day | ORAL | 0 refills | Status: DC

## 2017-02-03 MED ORDER — RIVAROXABAN (XARELTO) VTE STARTER PACK (15 & 20 MG): ORAL_TABLET | ORAL | 0 refills | Status: DC

## 2017-02-03 NOTE — Discharge Summary (Signed)
Physician Discharge Summary  Adrian [name]:[id number] DOB: [birth year] DOA: 01/31/2017  PCP: Orpah Melter, MD  Admit date: 01/31/2017 Discharge date: 02/03/2017  Time spent: 35 minutes  Recommendations for Outpatient Follow-up:  1. PCP Dr.Meyers in 1 week, for VTE and New DM 2. [facility] for Secondary Pulm HTN in 1 month, needs repeat ECHO in 2-33months   Discharge Diagnoses:  Principal Problem:   Acute PE (pulmonary thromboembolism) ([facility])   Secondary pulmonary hypertension   Obesity   Hypertension   Asthma   CKD (chronic kidney disease), stage III   Acute respiratory failure with hypoxia ([facility])   OSA on CPAP   Diarrhea   Type 2 diabetes mellitus without complication ([facility])   Pulmonary hypertension ([facility])   Discharge Condition: stable  Diet recommendation:DM/heart healthy  Filed Weights   01/31/17 2030 02/01/17 0118  Weight: (!) 159.2 kg (351 lb) (!) 159.2 kg (351 lb)    History of present illness:  Adrian [name] a 56 y.o.malewith medical history significant of hypertension, asthma, CKD-3, OSA on CPAP, who presented with shortness of breath in the right flank pain. Pt recently drove to [place] 19 days ago and drove back from [place] one week ago for vacation. Pt states that his SOB started soon after he arrived at home In ED:pt was found to be hypoxic, positive d-dimer 7.24, CT angiogram showed bilateral PE and evidence of right heart strain  Hospital Course:    Acute hypoxic resp failure -due to Acute PE, also has underlying OSA and asthma -hemodynamically stable, normotensive without hypoxia or significant dyspnea -started on IV heparin on admission and then transitioned to Xarelto, discussed DOACs and warfarin with pt  -ECHO notes moderate to severe Pulm HTN and RV dysfunction, suspect significant component of his Pulm HTN is related to his severe OSA/OHS and rest to his acute PE which part should get better with current anticoagulation -needs repeat  ECHO in 60months, needs Pulm FU, both these recommendations given to patient and wife -Ambulated in halls without much symptoms -clinically felt to be stable for discharge home on Xarelto at this time   Acute Bilateral PE -as above -risk factor: recent travel and sedentary lifestyle    Hypertension -Bp soft but stable  -resumed HCTZ and stopped ARB    Asthma -stable, nebs PRN    CKD (chronic kidney disease), stage 2 -creatinine in normal range now, was 1.4 on admission -improved and 0.9 at discharge    Severe OSA   -continue CPAP    Hyperglycemia/New DM -Hba1c is 6.5 -diet/weight loss and life style modification recommended -will need Rx down the road, consider Metformin first  Discharge Exam: Vitals:   02/02/17 2043 02/03/17 0553  BP:  122/76  Pulse: 90 77  Resp: 20 20  Temp:  98 F (36.7 C)    General: AAOx3 Cardiovascular: S1S2/RRR Respiratory: CTAB  Discharge Instructions   Discharge Instructions    Diet - low sodium heart healthy    Complete by:  As directed    Diet Carb Modified    Complete by:  As directed    Increase activity slowly    Complete by:  As directed      Discharge Medication List as of 02/03/2017  9:21 AM    START taking these medications   Details  hydrochlorothiazide (HYDRODIURIL) 25 MG tablet Take 1 tablet (25 mg total) by mouth daily., Starting Sun 02/03/2017, Print    Rivaroxaban 15 & 20 MG TBPK Take as directed on package:  Start with one 15mg  tablet by mouth twice a day with food. On Day 22, switch to one 20mg  tablet once a day with food., Print      CONTINUE these medications which have NOT CHANGED   Details  albuterol (PROVENTIL HFA;VENTOLIN HFA) 108 (90 Base) MCG/ACT inhaler Inhale into the lungs every 6 (six) hours as needed for wheezing or shortness of breath., Historical Med    fluticasone (FLONASE) 50 MCG/ACT nasal spray Place 1 spray into both nostrils daily., Historical Med    mometasone (ASMANEX) 220 MCG/INH  inhaler Inhale 2 puffs into the lungs daily., Historical Med    montelukast (SINGULAIR) 10 MG tablet Take 10 mg by mouth at bedtime., Historical Med      STOP taking these medications     aspirin EC 81 MG tablet      bismuth subsalicylate (PEPTO BISMOL) 262 MG/15ML suspension      dextromethorphan-guaiFENesin (MUCINEX DM) 30-600 MG per 12 hr tablet        Allergies  Allergen Reactions  . Lisinopril-Hydrochlorothiazide Cough  . Statins Other (See Comments)    Causes pain/ foot trouble   Follow-up Information    Orpah Melter, MD. Schedule an appointment as soon as possible for a visit in 1 week(s).   Specialty:  Family Medicine Contact information: [address] [place] [address] [phone]        [facility]. Schedule an appointment as soon as possible for a visit in 1 month(s).   Specialty:  Pulmonology Contact information: [place] [phone]           The results of significant diagnostics from this hospitalization (including imaging, microbiology, ancillary and laboratory) are listed below for reference.    Significant Diagnostic Studies: Dg Chest 2 View  Result Date: 01/31/2017 CLINICAL DATA:  Back and flank pain, shortness of breath. EXAM: CHEST  2 VIEW COMPARISON:  None. FINDINGS: Cardiac silhouette is upper limits of normal in size, mediastinal silhouette is nonsuspicious, mildly calcified aortic knob. Pulmonary vascular congestion, mildly elevated RIGHT hemidiaphragm with bibasilar strandy densities. No pleural effusion or focal consolidation. No pneumothorax. Mild degenerative change of the thoracic spine. IMPRESSION: Borderline cardiomegaly and pulmonary vascular congestion. Bibasilar atelectasis. Electronically Signed   By: Elon Alas M.D.   On: 01/31/2017 21:44   Ct Angio Chest Pe W And/or Wo Contrast  Result Date: 01/31/2017 CLINICAL DATA:  Pleuritic chest pain, RIGHT  upper back and flank pain after recent travel. Hypoxia. History of asthma. EXAM: CT ANGIOGRAPHY CHEST WITH CONTRAST TECHNIQUE: Multidetector CT imaging of the chest was performed using the standard protocol during bolus administration of intravenous contrast. Multiplanar CT image reconstructions and MIPs were obtained to evaluate the vascular anatomy. CONTRAST:  75 cc Isovue 370 COMPARISON:  Chest radiograph January 31, 2017 at 2035 hours FINDINGS: CARDIOVASCULAR: Adequate contrast opacification of the pulmonary artery's. Main pulmonary artery is mildly enlarged at 3.4 cm. Central filling defects LEFT upper lobe segmental to subsegmental branches, lingula segmental branches and LEFT lower lobe lobar artery extending to subsegmental branches with occlusion. Occlusive RIGHT upper lobar pulmonary infarct artery embolism, nonocclusive emboli RIGHT lower lobar artery calf dating to segmental branches. Occlusive RIGHT middle lobar pulmonary embolus. Heart size is upper limits of normal, RIGHT heart strain (RV/LV -= 1.1). No pericardial effusion. Thoracic aorta is normal course and caliber, trace calcific atherosclerosis. MEDIASTINUM/NODES: No lymphadenopathy by CT size criteria. LUNGS/PLEURA: Tracheobronchial tree is patent, no pneumothorax. Bibasilar atelectasis.  No focal consolidation/infarcts. No pleural effusion. UPPER ABDOMEN: Included view of the abdomen is nonacute. Elevated RIGHT hemidiaphragm. Mild hepatomegaly suspected, incompletely assessed. MUSCULOSKELETAL: Visualized soft tissues and included osseous structures are nonacute. Moderate degenerative change of the thoracic spine. Review of the MIP images confirms the above findings. IMPRESSION: 1. Acute bilateral pulmonary emboli, occlusive in RIGHT middle and RIGHT upper lobar artery's. CT evidence of right heart strain (RV/LV Ratio = 1.1) consistent with at least submassive (intermediate risk) PE. The presence of right heart strain has been associated with an  increased risk of morbidity and mortality. Please activate Code PE by paging [phone]. 2. Atelectasis without pulmonary infarct. 3. Critical Value/emergent results were called by telephone at the time of interpretation on 01/31/2017 at 10:19 pm to Dr. Brantley Stage , who verbally acknowledged these results. Aortic Atherosclerosis (ICD10-I70.0). Electronically Signed   By: Elon Alas M.D.   On: 01/31/2017 22:24    Microbiology: No results found for this or any previous visit (from the past 240 hour(s)).   Labs: Basic Metabolic Panel:  Recent Labs Lab 01/31/17 2036 02/01/17 0435 02/02/17 0444  NA 137 139 136  K 3.8 3.6 3.8  CL 102 107 104  CO2 22 23 25   GLUCOSE 142* 133* 124*  BUN 19 18 14   CREATININE 1.42* 1.07 0.91  CALCIUM 8.9 8.7* 8.8*   Liver Function Tests:  Recent Labs Lab 01/31/17 2036  AST 38  ALT 40  ALKPHOS 80  BILITOT 0.6  PROT 7.9  ALBUMIN 3.9   No results for input(s): LIPASE, AMYLASE in the last 168 hours. No results for input(s): AMMONIA in the last 168 hours. CBC:  Recent Labs Lab 01/31/17 2036 02/02/17 0444 02/03/17 0428  WBC 12.8* 9.7 11.8*  NEUTROABS 9.7*  --   --   HGB 14.9 13.9 13.8  HCT 43.8 41.3 41.1  MCV 85.9 84.8 85.8  PLT 225 218 250   Cardiac Enzymes:  Recent Labs Lab 01/31/17 2036 02/01/17 0159 02/01/17 0435  TROPONINI <0.03 <0.03 <0.03   BNP: BNP (last 3 results)  Recent Labs  01/31/17 2044  BNP 46.3    ProBNP (last 3 results) No results for input(s): PROBNP in the last 8760 hours.  CBG:  Recent Labs Lab 02/01/17 0736 02/02/17 0753 02/03/17 0748  GLUCAP 143* 140* 118*       SignedDomenic Polite MD.  [facility] 02/03/2017, 1:34 PM

## 2017-02-05 LAB — PROTEIN C, TOTAL: Protein C, Total: 76 % (ref 60–150)

## 2017-02-05 LAB — HOMOCYSTEINE: HOMOCYSTEINE-NORM: 7.8 umol/L (ref 0.0–15.0)

## 2017-02-06 LAB — BETA-2-GLYCOPROTEIN I ABS, IGG/M/A: Beta-2 Glyco I IgG: <9 GPI IgG units (ref 0–20)

## 2017-02-06 LAB — PROTEIN S ACTIVITY: Protein S Activity: 78 % (ref 63–140)

## 2017-02-06 LAB — PROTEIN C ACTIVITY: Protein C Activity: 84 % (ref 73–180)

## 2017-02-06 LAB — PROTEIN S, TOTAL: Protein S Ag, Total: 92 % (ref 60–150)

## 2017-02-08 LAB — PROTHROMBIN GENE MUTATION

## 2017-02-08 LAB — LUPUS ANTICOAGULANT PANEL
DRVVT: 47.5 s — ABNORMAL HIGH (ref 0.0–47.0)
PTT Lupus Anticoagulant: 59 s — ABNORMAL HIGH (ref 0.0–51.9)

## 2017-02-08 LAB — DRVVT MIX: dRVVT Mix: 40.2 s (ref 0.0–47.0)

## 2017-02-08 LAB — FACTOR 5 LEIDEN

## 2017-02-08 LAB — PTT-LA MIX: PTT-LA Mix: 36.7 s (ref 0.0–48.9)

## 2017-02-08 LAB — PTT-LA INCUB MIX: PTT-LA Incub Mix: 50.2 s — ABNORMAL HIGH (ref 0.0–48.9)

## 2017-02-08 LAB — HEXAGONAL PHASE PHOSPHOLIPID: Hexagonal Phase Phospholipid: 11 s (ref 0–11)

## 2017-02-11 DIAGNOSIS — E119 Type 2 diabetes mellitus without complications: Secondary | ICD-10-CM | POA: Diagnosis not present

## 2017-02-11 DIAGNOSIS — I2699 Other pulmonary embolism without acute cor pulmonale: Secondary | ICD-10-CM | POA: Diagnosis not present

## 2017-02-11 DIAGNOSIS — R490 Dysphonia: Secondary | ICD-10-CM | POA: Diagnosis not present

## 2017-02-11 DIAGNOSIS — I2721 Secondary pulmonary arterial hypertension: Secondary | ICD-10-CM | POA: Diagnosis not present

## 2017-03-04 DIAGNOSIS — G4733 Obstructive sleep apnea (adult) (pediatric): Secondary | ICD-10-CM | POA: Diagnosis not present

## 2017-03-13 ENCOUNTER — Ambulatory Visit (INDEPENDENT_AMBULATORY_CARE_PROVIDER_SITE_OTHER): Payer: Federal, State, Local not specified - PPO | Admitting: Internal Medicine

## 2017-03-13 ENCOUNTER — Encounter: Payer: Self-pay | Admitting: Internal Medicine

## 2017-03-13 VITALS — BP 122/82 | HR 69 | Ht 73.0 in | Wt 347.4 lb

## 2017-03-13 DIAGNOSIS — I2699 Other pulmonary embolism without acute cor pulmonale: Secondary | ICD-10-CM | POA: Diagnosis not present

## 2017-03-13 DIAGNOSIS — I272 Pulmonary hypertension, unspecified: Secondary | ICD-10-CM | POA: Diagnosis not present

## 2017-03-13 DIAGNOSIS — J4530 Mild persistent asthma, uncomplicated: Secondary | ICD-10-CM

## 2017-03-13 DIAGNOSIS — G4733 Obstructive sleep apnea (adult) (pediatric): Secondary | ICD-10-CM | POA: Diagnosis not present

## 2017-03-13 NOTE — Progress Notes (Signed)
Subjective:     Patient ID: Adrian [name], male   DOB: Dec 06, 1960,   MRN: [id number]  HPI   56 yowm morbidly  obese complicated by osa/ cpap started April 2018 with tendency to leg cramps and gradual doe and then much worse p arrived back from car trip to [place] with dx of L dvt/ PE:   Admit date: 01/31/2017 Discharge date: 02/03/2017   Discharge Diagnoses:  Principal Problem:   Acute PE (pulmonary thromboembolism) ([place])   Secondary pulmonary hypertension   Obesity   Hypertension   Asthma   CKD (chronic kidney disease), stage III   Acute respiratory failure with hypoxia (HCC)   OSA on CPAP   Diarrhea   Type 2 diabetes mellitus without complication ([place])   Pulmonary hypertension ([place])         Filed Weights   01/31/17 2030 02/01/17 0118  Weight: (!) 159.2 kg (351 lb) (!) 159.2 kg (351 lb)    History of present illness:  Adrian [name] a 56 y.o.malewith medical history significant of hypertension, asthma, CKD-3, OSA on CPAP, who presented with shortness of breath in the right flank pain. Pt recently drove to [facility] 19 days pta and drove back from [place] one week pta for vacation. Pt states that his SOB started soon after he arrived at home In ED:pt was found to be hypoxic, positive d-dimer 7.24, CT angiogram showed bilateral PE and evidence of right heart strain  Hospital Course:  Acute hypoxic resp failure -due to Acute PE, also has underlying OSA and asthma -hemodynamically stable, normotensive without hypoxia or significant dyspnea -started onIV heparin on admission and then transitionedto Xarelto,discussed DOACs and warfarin with pt  -ECHO notes moderate to severe Pulm HTN and RV dysfunction, suspect significant component of his Pulm HTN is related to his severe OSA/OHS and rest to his acute PE which part should get better with current anticoagulation -  needs Pulm FU, both these recommendations given to patient and wife -Ambulated in halls  without much symptoms -clinically felt to be stable for discharge home on Xarelto at this time  Acute Bilateral PE -as above -risk factor: recent travel and sedentary lifestyle - venous dopplers 02/02/17 >  acute deep vein thrombosis involving the posterior tibial veins of the left lower extremity.  Hypertension -Bp soft but stable  -resumed HCTZ and stopped ARB  Asthma -stable, nebs PRN  CKD (chronic kidney disease), stage 2 -creatinine in normal range now, was 1.4 on admission -improved and 0.9 at discharge  Severe OSA  -continue CPAP  Hyperglycemia/New DM -Hba1c is 6.5 -diet/weight loss and life style modification recommended -will need Rx down the road, consider Metformin first     03/13/2017 1st [facility] office visit/ Wert   Chief Complaint  Patient presents with  . Advice Only    Referred by Dr. Elesa Hacker due to hx of pulmonary htn. Pt had a bilateral pulmonary embolism and was in hospital 01/31/17-02/03/17. Pt states that he is now breathing better than he had been.  no symptoms localized to L leg pre or post rx with xarelto but rather noct cramping in both  Breathing better than it was even in the last year pta H/o allergy/asthma since childhood maint on asthmex and singulair but not cough / noct wheeze or need for saba    No obvious day to day or daytime variability or assoc excess/ purulent sputum or mucus plugs or hemoptysis or cp or chest tightness, subjective wheeze or overt sinus  or hb symptoms. No unusual exp hx or h/o childhood pna/ asthma or knowledge of premature birth.  Sleeping ok on cpap  without nocturnal  or early am exacerbation  of respiratory  c/o's or need for noct saba. Also denies any obvious fluctuation of symptoms with weather or environmental changes or other aggravating or alleviating factors except as outlined above   Current Medications, Allergies, Complete Past Medical History, Past Surgical History, Family  History, and Social History were reviewed in [employer] record.  ROS  The following are not active complaints unless bolded sore throat, dysphagia, dental problems, itching, sneezing,  nasal congestion or excess/ purulent secretions, ear ache,   fever, chills, sweats, unintended wt loss, classically pleuritic or exertional cp,  orthopnea pnd or leg swelling, presyncope, palpitations, abdominal pain, anorexia, nausea, vomiting, diarrhea  or change in bowel or bladder habits, change in stools or urine, dysuria,hematuria,  rash, arthralgias, visual complaints, headache, numbness, weakness or ataxia or problems with walking or coordination,  change in mood/affect or memory.         Review of Systems     Objective:   Physical Exam    Pleasant amb obese wm  Wt Readings from Last 3 Encounters:  03/13/17 (!) 347 lb 6.4 oz (157.6 kg)  02/01/17 (!) 351 lb (159.2 kg)  10/15/15 (!) 350 lb (158.8 kg)    Vital signs reviewed  - Note on arrival 02 sats  93 % on RA    HEENT: nl dentition, turbinates bilaterally, and oropharynx. Nl external ear canals without cough reflex   NECK :  without JVD/Nodes/TM/ nl carotid upstrokes bilaterally   LUNGS: no acc muscle use,  Nl contour chest which is clear to A and P bilaterally without cough on insp or exp maneuvers   CV:  RRR  no s3 or murmur or def  increase in P2, and no edema   ABD:  soft and nontender with nl inspiratory excursion in the supine position. No bruits or organomegaly appreciated, bowel sounds nl  MS:  Nl gait/ ext warm without deformities, calf tenderness, cyanosis or clubbing No obvious joint restrictions   SKIN: warm and dry without lesions    NEURO:  alert, approp, nl sensorium with  no motor or cerebellar deficits apparent.       I personally reviewed images and agree with radiology impression as follows:   Chest CT a  01/31/17 1. Acute bilateral pulmonary emboli, occlusive in RIGHT middle and RIGHT  upper lobar artery's. CT evidence of right heart strain (RV/LV Ratio = 1.1) consistent with at least submassive (intermediate risk) PE. The presence of right heart strain has been associated with an increased risk of morbidity and mortality. Please activate Code PE by paging [phone]. 2. Atelectasis without pulmonary infarct.     Assessment:

## 2017-03-13 NOTE — Patient Instructions (Signed)
No change in xarelto or asthma medications except for albuterol as follows    Only use your albuterol as a rescue medication to be used if you can't catch your breath by resting or doing a relaxed purse lip breathing pattern.  - The less you use it, the better it will work when you need it. - Ok to use up to 2 puffs  every 4 hours if you must but call for immediate appointment if use goes up over your usual need - Don't leave home without it !!  (think of it like the spare tire for your car)   Wear the elastic stockings as much as you can   After the first of the year you will need a repeat echo and venous doppler - we will call you to set this up    Please schedule a follow up visit in 5  months but call sooner if needed

## 2017-03-14 DIAGNOSIS — J4530 Mild persistent asthma, uncomplicated: Secondary | ICD-10-CM | POA: Insufficient documentation

## 2017-03-14 NOTE — Assessment & Plan Note (Signed)
Dx 01/31/17 with assoc acute L DVT and neg hypercoagulable w/u  - Echo 02/01/17 Left ventricle: The cavity size was mildly dilated. There was   moderate hypertrophy of the septum with otherwise mild concentric   hypertrophy. Systolic function was normal. Wall motion was   normal; there were no regional wall motion abnormalities. Doppler   parameters are consistent with abnormal left ventricular   relaxation (grade 1 diastolic dysfunction). Doppler parameters   are consistent with indeterminate ventricular filling pressure. - Aortic valve: Transvalvular velocity was within the normal range.   There was no stenosis. There was no regurgitation. - Mitral valve: Transvalvular velocity was within the normal range.   There was no evidence for stenosis. There was no regurgitation. - Right ventricle: The cavity size was severely dilated. Wall   thickness was normal. Systolic function was severely reduced. - Tricuspid valve: There was mild regurgitation. - Pulmonary arteries: Systolic pressure was severely increased. PA   peak pressure: 61 mm Hg (S).   He may well prove to have underlying PH from OSA and that's why his pressure acutely was so high but he didn't require thrombolytics (acute on chronic tolerated better than same amount of increase with acute vs nl baseline PAS)  In any case he's feeling great on xarelto and cpap and no indication to repeat the echo until 6 months out when he returns for venous dopplers as well  Discussed in detail all the  indications, usual  risks and alternatives  relative to the benefits with patient who agrees to proceed with rx  as outlined with options for longterm rx to be re-visited in 5 months after above studies and wt loss (see separate a/p)    Total time devoted to counseling  > 50 % of initial 60 min office visit:  review case with pt/wife discussion of options/alternatives/ personally creating written customized instructions  in presence of pt  then going  over those specific  Instructions directly with the pt including how to use all of the meds but in particular covering each new medication in detail and the difference between the maintenance= "automatic" meds and the prns using an action plan format for the latter (If this problem/symptom => do that organization reading Left to right).  Please see AVS from this visit for a full list of these instructions which I personally wrote for this pt and  are unique to this visit.

## 2017-03-14 NOTE — Assessment & Plan Note (Addendum)
complicated by hbp and dvt/pe 01/2017   Body mass index is 45.83 kg/m.  -  trending down No results found for: TSH   Contributing also to gerd risk/ doe/reviewed the need and the process to achieve and maintain neg calorie balance > defer f/u primary care including intermittently monitoring thyroid status

## 2017-03-14 NOTE — Assessment & Plan Note (Signed)
Well controlled 03/13/2017 on asmanex/ singulair / prn saba   All goals of chronic asthma control met including optimal function and elimination of symptoms with minimal need for rescue therapy.  Contingencies discussed in full including contacting this office immediately if not controlling the symptoms using the rule of two's.     Reviewed:  If your breathing worsens or you need to use your rescue inhaler more than twice weekly or wake up more than twice a month with any respiratory symptoms or require more than two rescue inhalers per year, we need to see you right away because this means we're not controlling the underlying problem (inflammation) adequately.  Rescue inhalers do not control inflammation and overuse can lead to unnecessary and costly consequences.  They can make you feel better temporarily but eventually they will quit working effectively much as sleep aids lead to more insomnia if used regularly.

## 2017-03-14 NOTE — Assessment & Plan Note (Signed)
See comments re PE > recheck echo p 6 m of xarelto since he's doing so well symptomatically  p only one month of rx

## 2017-04-04 DIAGNOSIS — G4733 Obstructive sleep apnea (adult) (pediatric): Secondary | ICD-10-CM | POA: Diagnosis not present

## 2017-04-08 DIAGNOSIS — K080 Exfoliation of teeth due to systemic causes: Secondary | ICD-10-CM | POA: Diagnosis not present

## 2017-04-24 DIAGNOSIS — I10 Essential (primary) hypertension: Secondary | ICD-10-CM | POA: Diagnosis not present

## 2017-04-24 DIAGNOSIS — J4520 Mild intermittent asthma, uncomplicated: Secondary | ICD-10-CM | POA: Diagnosis not present

## 2017-04-24 DIAGNOSIS — E7800 Pure hypercholesterolemia, unspecified: Secondary | ICD-10-CM | POA: Diagnosis not present

## 2017-04-24 DIAGNOSIS — Z23 Encounter for immunization: Secondary | ICD-10-CM | POA: Diagnosis not present

## 2017-04-24 DIAGNOSIS — E119 Type 2 diabetes mellitus without complications: Secondary | ICD-10-CM | POA: Diagnosis not present

## 2017-04-24 LAB — LIPID PANEL
Cholesterol: 217 — AB (ref 0–200)
HDL: 35 (ref 35–70)
LDL Cholesterol: 158
TRIGLYCERIDES: 120 (ref 40–160)

## 2017-04-24 LAB — HEMOGLOBIN A1C: Hemoglobin A1C: 6.2

## 2017-04-24 LAB — BASIC METABOLIC PANEL: GLUCOSE: 89

## 2017-06-17 DIAGNOSIS — G4733 Obstructive sleep apnea (adult) (pediatric): Secondary | ICD-10-CM | POA: Diagnosis not present

## 2017-06-27 ENCOUNTER — Other Ambulatory Visit: Payer: Self-pay | Admitting: Internal Medicine

## 2017-06-27 DIAGNOSIS — I2699 Other pulmonary embolism without acute cor pulmonale: Secondary | ICD-10-CM

## 2017-07-24 ENCOUNTER — Ambulatory Visit (HOSPITAL_COMMUNITY)
Admission: RE | Admit: 2017-07-24 | Discharge: 2017-07-24 | Disposition: A | Discharge status: Home / Self Care | Payer: Federal, State, Local not specified - PPO | Source: Ambulatory Visit | Attending: Internal Medicine | Admitting: Internal Medicine

## 2017-07-24 ENCOUNTER — Ambulatory Visit (HOSPITAL_BASED_OUTPATIENT_CLINIC_OR_DEPARTMENT_OTHER)
Admission: RE | Admit: 2017-07-24 | Discharge: 2017-07-24 | Disposition: A | Discharge status: Home / Self Care | Payer: Federal, State, Local not specified - PPO | Source: Ambulatory Visit | Attending: Internal Medicine | Admitting: Internal Medicine

## 2017-07-24 ENCOUNTER — Encounter (INDEPENDENT_AMBULATORY_CARE_PROVIDER_SITE_OTHER): Payer: Self-pay

## 2017-07-24 DIAGNOSIS — I2699 Other pulmonary embolism without acute cor pulmonale: Secondary | ICD-10-CM | POA: Diagnosis not present

## 2017-07-24 DIAGNOSIS — I10 Essential (primary) hypertension: Secondary | ICD-10-CM | POA: Diagnosis not present

## 2017-07-24 DIAGNOSIS — I517 Cardiomegaly: Secondary | ICD-10-CM | POA: Diagnosis not present

## 2017-07-24 MED ORDER — PERFLUTREN LIPID MICROSPHERE
1.0000 mL | INTRAVENOUS | Status: AC | PRN
  Administered 2017-07-24: 2 mL via INTRAVENOUS
  Filled 2017-07-24: qty 10

## 2017-07-24 NOTE — Progress Notes (Signed)
LE venous duplex prelim: no evidence of DVT. Landry Mellow, RDMS, RVT

## 2017-07-24 NOTE — Progress Notes (Signed)
  Echocardiogram 2D Echocardiogram has been performed.  Merrie Roof F 07/24/2017, 12:00 PM

## 2017-08-13 ENCOUNTER — Encounter: Payer: Self-pay | Admitting: Internal Medicine

## 2017-08-13 ENCOUNTER — Ambulatory Visit: Payer: Federal, State, Local not specified - PPO | Admitting: Internal Medicine

## 2017-08-13 VITALS — BP 124/78 | HR 63 | Ht 73.0 in | Wt 356.4 lb

## 2017-08-13 DIAGNOSIS — I2699 Other pulmonary embolism without acute cor pulmonale: Secondary | ICD-10-CM

## 2017-08-13 MED ORDER — RIVAROXABAN 20 MG PO TABS: 20.0000 mg | ORAL_TABLET | Freq: Every day | ORAL | 5 refills | Status: DC

## 2017-08-13 NOTE — Patient Instructions (Signed)
Continue xarelto 20 mg daily   Weight control is simply a matter of calorie balance which needs to be tilted in your favor by eating less and exercising more.  To get the most out of exercise, you need to be continuously aware that you are short of breath, but never out of breath, for 30 minutes daily. As you improve, it will actually be easier for you to do the same amount of exercise  in  30 minutes so always push to the level where you are short of breath.  If this does not result in gradual weight reduction then I strongly recommend you see a nutritionist with a food diary x 2 weeks so that we can work out a negative calorie balance which is universally effective in steady weight loss programs.  Think of your calorie balance like you do your bank account where in this case you want the balance to go down so you must take in less calories than you burn up.  It's just that simple:  Hard to do, but easy to understand.  Good luck!   Please schedule a follow up visit in 6  months but call sooner if needed

## 2017-08-13 NOTE — Progress Notes (Signed)
Subjective:     Patient ID: Adrian [name], male   DOB: Jun 05, 1961,   MRN: [id number]     Brief patient profile:  55 yowm morbidly  obese complicated by osa/ cpap started April 2018 with tendency to leg cramps and gradual doe and then much worse p arrived back from car trip to [place] with dx of L dvt/ PE 01/2017 with neg hypercoagulable w/u on initial eval    Admit date: 01/31/2017 Discharge date: 02/03/2017   Discharge Diagnoses:  Principal Problem:   Acute PE (pulmonary thromboembolism) ([place])   Secondary pulmonary hypertension   Obesity   Hypertension   Asthma   CKD (chronic kidney disease), stage III   Acute respiratory failure with hypoxia (HCC)   OSA on CPAP   Diarrhea   Type 2 diabetes mellitus without complication ([place])   Pulmonary hypertension ([place])         Filed Weights   01/31/17 2030 02/01/17 0118  Weight: (!) 159.2 kg (351 lb) (!) 159.2 kg (351 lb)    History of present illness:  Adrian [name] a 57 y.o.malewith medical history significant of hypertension, asthma, CKD-3, OSA on CPAP, who presented with shortness of breath in the right flank pain. Pt recently drove to [facility] 19 days pta and drove back from [place] one week pta for vacation. Pt states that his SOB started soon after he arrived at home In ED:pt was found to be hypoxic, positive d-dimer 7.24, CT angiogram showed bilateral PE and evidence of right heart strain  Hospital Course:  Acute hypoxic resp failure -due to Acute PE, also has underlying OSA and asthma -hemodynamically stable, normotensive without hypoxia or significant dyspnea -started onIV heparin on admission and then transitionedto Xarelto,discussed DOACs and warfarin with pt  -ECHO notes moderate to severe Pulm HTN and RV dysfunction, suspect significant component of his Pulm HTN is related to his severe OSA/OHS and rest to his acute PE which part should get better with current anticoagulation -  needs Pulm FU, both  these recommendations given to patient and wife -Ambulated in halls without much symptoms -clinically felt to be stable for discharge home on Xarelto at this time  Acute Bilateral PE -as above -risk factor: recent travel and sedentary lifestyle - venous dopplers 02/02/17 >  acute deep vein thrombosis involving the posterior tibial veins of the left lower extremity.  Hypertension -Bp soft but stable  -resumed HCTZ and stopped ARB  Asthma -stable, nebs PRN  CKD (chronic kidney disease), stage 2 -creatinine in normal range now, was 1.4 on admission -improved and 0.9 at discharge  Severe OSA  -continue CPAP  Hyperglycemia/New DM -Hba1c is 6.5 -diet/weight loss and life style modification recommended -will need Rx down the road, consider Metformin first     03/13/2017 1st [facility] office visit/ Adrian [name]   Chief Complaint  Patient presents with  . Advice Only    Referred by Dr. Elesa Hacker due to hx of pulmonary htn. Pt had a bilateral pulmonary embolism and was in hospital 01/31/17-02/03/17. Pt states that he is now breathing better than he had been.  no symptoms localized to L leg pre or post rx with xarelto but rather noct cramping in both  Breathing better than it was even in the last year pta H/o allergy/asthma since childhood maint on asthmex and singulair but not cough / noct wheeze or need for saba  rec No change in xarelto or asthma medications except for albuterol as follows  Only use your  albuterol as a rescue medication to be used if you can't catch your breath by resting or doing a relaxed purse lip breathing pattern.  - The less you use it, the better it will work when you need it. - Ok to use up to 2 puffs  every 4 hours if you must but call for immediate appointment if use goes up over your usual need - Don't leave home without it !!  (think of it like the spare tire for your car)   Wear the elastic stockings as much as you can   After the  first of the year you will need a repeat echo and venous doppler   Venous dopplers 07/24/17 >  Neg  ECHO 08/12/17> no PH    08/13/2017  f/u ov/Adrian [name] re: s/p PE rx since 01/2017 wotj xarelto Chief Complaint  Patient presents with  . Follow-up    Breathing is doing well. He does c/o some right side low back pain.    positional R sided mid/ lower back pain s pleuritic features or urinary/ gi symptoms Doe = MMRC1 = can walk nl pace, flat grade, can't hurry or go uphills or steps s sob    No obvious day to day or daytime variability or assoc excess/ purulent sputum or mucus plugs or hemoptysis or cp or chest tightness, subjective wheeze or overt sinus or hb symptoms. No unusual exposure hx or h/o childhood pna/ asthma or knowledge of premature birth.  Sleeping ok on cpap without nocturnal  or early am exacerbation  of respiratory  c/o's or need for noct saba. Also denies any obvious fluctuation of symptoms with weather or environmental changes or other aggravating or alleviating factors except as outlined above   Current Allergies, Complete Past Medical History, Past Surgical History, Family History, and Social History were reviewed in [employer] record.  ROS  The following are not active complaints unless bolded Hoarseness, sore throat, dysphagia, dental problems, itching, sneezing,  nasal congestion or discharge of excess mucus or purulent secretions, ear ache,   fever, chills, sweats, unintended wt loss or wt gain, classically pleuritic or exertional cp,  orthopnea pnd or leg swelling, presyncope, palpitations, abdominal pain, anorexia, nausea, vomiting, diarrhea  or change in bowel habits or change in bladder habits, change in stools or change in urine, dysuria, hematuria,  rash, arthralgias, visual complaints, headache, numbness, weakness or ataxia or problems with walking or coordination,  change in mood/affect or memory.        Current Meds  Medication Sig  .  hydrochlorothiazide (HYDRODIURIL) 25 MG tablet Take 1 tablet (25 mg total) by mouth daily.  . montelukast (SINGULAIR) 10 MG tablet Take 10 mg by mouth at bedtime.  . Oxymetazoline HCl (AFRIN 12 HOUR NA) Place into the nose as needed.  . Rivaroxaban 15 & 20 MG TBPK Take as directed on package: Start with one 15mg  tablet by mouth twice a day with food. On Day 22, switch to one 20mg  tablet once a day with food.  Marland Kitchen UNABLE TO FIND Med Name: CPAP                      Objective:   Physical Exam     pleasant amb massively obes  wm nad    08/13/2017           356   03/13/17 (!) 347 lb 6.4 oz (157.6 kg)  02/01/17 (!) 351 lb (159.2 kg)  10/15/15 (!) 350  lb (158.8 kg)     Vital signs reviewed - Note on arrival 02 sats  96% on RA     HEENT: nl dentition, turbinates bilaterally, and oropharynx. Nl external ear canals without cough reflex   NECK :  without JVD/Nodes/TM/ nl carotid upstrokes bilaterally   LUNGS: no acc muscle use,  Nl contour chest which is clear to A and P bilaterally without cough on insp or exp maneuvers   CV:  RRR  no s3 or murmur or increase in P2, and no edema   ABD:  soft and nontender with nl inspiratory excursion in the supine position. No bruits or organomegaly appreciated, bowel sounds nl  MS:  Nl gait/ ext warm without deformities, calf tenderness, cyanosis or clubbing No obvious joint restrictions / minimal tenderness R paralumbar /no rash or sts/ neg slr  SKIN: warm and dry without lesions    NEURO:  alert, approp, nl sensorium with  no motor or cerebellar deficits apparent.           Assessment:

## 2017-08-14 ENCOUNTER — Encounter: Payer: Self-pay | Admitting: Internal Medicine

## 2017-08-14 NOTE — Assessment & Plan Note (Addendum)
Dx 01/31/17 with assoc acute L DVT and neg hypercoagulable w/u  - Echo 02/01/17 Left ventricle: The cavity size was mildly dilated. There was   moderate hypertrophy of the septum with otherwise mild concentric   hypertrophy. Systolic function was normal. Wall motion was   normal; there were no regional wall motion abnormalities. Doppler   parameters are consistent with abnormal left ventricular   relaxation (grade 1 diastolic dysfunction). Doppler parameters   are consistent with indeterminate ventricular filling pressure. - Aortic valve: Transvalvular velocity was within the normal range.   There was no stenosis. There was no regurgitation. - Mitral valve: Transvalvular velocity was within the normal range.   There was no evidence for stenosis. There was no regurgitation. - Right ventricle: The cavity size was severely dilated. Wall   thickness was normal. Systolic function was severely reduced. - Tricuspid valve: There was mild regurgitation. - Pulmonary arteries: Systolic pressure was severely increased. PA   peak pressure: 61 mm Hg (S). Venous dopplers 07/24/17 >  Neg  ECHO 08/12/17> no PH but Pos LAE  Only problem with echo is LAE likely from diastolic dysfunction related to obesity with complete recovery of RV so no indication for v/q or repeat CTa at this point and the main concern is his risk for future dvt/ pe due to obesity - after consideration of risk/ benefits/alternatives (second opinion by heme) pt/wife agree  rec 6 more months xarelto and work on wt  I had an extended discussion with the patient reviewing all relevant studies completed to date and  lasting 15 to 20 minutes of a 25 minute visit    Each maintenance medication was reviewed in detail including most importantly the difference between maintenance and prns and under what circumstances the prns are to be triggered using an action plan format that is not reflected in the computer generated alphabetically organized AVS.     Please see AVS for specific instructions unique to this visit that I personally wrote and verbalized to the the pt in detail and then reviewed with pt  by my nurse highlighting any  changes in therapy recommended at today's visit to their plan of care.

## 2017-08-14 NOTE — Assessment & Plan Note (Addendum)
Body mass index is 47.02 kg/m.  -  trending up  No results found for: TSH   Contributing to osa/diastolic dysfunction /PE risk / persistent mild  doe/reviewed the need and the process to achieve and maintain neg calorie balance > defer f/u primary care including intermittently monitoring thyroid status    see avs for instructions unique to this ov

## 2017-10-08 DIAGNOSIS — K080 Exfoliation of teeth due to systemic causes: Secondary | ICD-10-CM | POA: Diagnosis not present

## 2017-10-17 DIAGNOSIS — H2513 Age-related nuclear cataract, bilateral: Secondary | ICD-10-CM | POA: Diagnosis not present

## 2017-10-17 DIAGNOSIS — E119 Type 2 diabetes mellitus without complications: Secondary | ICD-10-CM | POA: Diagnosis not present

## 2017-10-17 LAB — HM DIABETES EYE EXAM

## 2017-12-20 DIAGNOSIS — T148XXA Other injury of unspecified body region, initial encounter: Secondary | ICD-10-CM | POA: Diagnosis not present

## 2017-12-20 DIAGNOSIS — S81802A Unspecified open wound, left lower leg, initial encounter: Secondary | ICD-10-CM | POA: Diagnosis not present

## 2017-12-26 DIAGNOSIS — G4733 Obstructive sleep apnea (adult) (pediatric): Secondary | ICD-10-CM | POA: Diagnosis not present

## 2017-12-30 DIAGNOSIS — G4733 Obstructive sleep apnea (adult) (pediatric): Secondary | ICD-10-CM | POA: Diagnosis not present

## 2018-01-02 DIAGNOSIS — S81802D Unspecified open wound, left lower leg, subsequent encounter: Secondary | ICD-10-CM | POA: Diagnosis not present

## 2018-01-14 ENCOUNTER — Ambulatory Visit: Payer: Federal, State, Local not specified - PPO | Admitting: Family Medicine

## 2018-02-06 ENCOUNTER — Encounter: Payer: Self-pay | Admitting: Family Medicine

## 2018-02-06 ENCOUNTER — Ambulatory Visit: Payer: Federal, State, Local not specified - PPO | Admitting: Family Medicine

## 2018-02-06 VITALS — BP 132/81 | HR 54 | Temp 98.5°F | Resp 16 | Ht 72.0 in | Wt 350.0 lb

## 2018-02-06 DIAGNOSIS — J4530 Mild persistent asthma, uncomplicated: Secondary | ICD-10-CM | POA: Diagnosis not present

## 2018-02-06 DIAGNOSIS — I2699 Other pulmonary embolism without acute cor pulmonale: Secondary | ICD-10-CM

## 2018-02-06 DIAGNOSIS — Z7689 Persons encountering health services in other specified circumstances: Secondary | ICD-10-CM

## 2018-02-06 DIAGNOSIS — G4733 Obstructive sleep apnea (adult) (pediatric): Secondary | ICD-10-CM

## 2018-02-06 DIAGNOSIS — I10 Essential (primary) hypertension: Secondary | ICD-10-CM

## 2018-02-06 DIAGNOSIS — E119 Type 2 diabetes mellitus without complications: Secondary | ICD-10-CM

## 2018-02-06 DIAGNOSIS — S81802A Unspecified open wound, left lower leg, initial encounter: Secondary | ICD-10-CM

## 2018-02-06 DIAGNOSIS — E559 Vitamin D deficiency, unspecified: Secondary | ICD-10-CM | POA: Insufficient documentation

## 2018-02-06 DIAGNOSIS — E782 Mixed hyperlipidemia: Secondary | ICD-10-CM

## 2018-02-06 DIAGNOSIS — E785 Hyperlipidemia, unspecified: Secondary | ICD-10-CM | POA: Insufficient documentation

## 2018-02-06 DIAGNOSIS — Z9989 Dependence on other enabling machines and devices: Secondary | ICD-10-CM

## 2018-02-06 LAB — CBC WITH DIFFERENTIAL/PLATELET
BASOS ABS: 0.1 10*3/uL (ref 0.0–0.1)
BASOS PCT: 1.1 % (ref 0.0–3.0)
Eosinophils Absolute: 0.2 10*3/uL (ref 0.0–0.7)
Eosinophils Relative: 2.1 % (ref 0.0–5.0)
HCT: 43.2 % (ref 39.0–52.0)
Hemoglobin: 14.4 g/dL (ref 13.0–17.0)
LYMPHS ABS: 1.6 10*3/uL (ref 0.7–4.0)
Lymphocytes Relative: 20.4 % (ref 12.0–46.0)
MCHC: 33.4 g/dL (ref 30.0–36.0)
MCV: 85.7 fl (ref 78.0–100.0)
Monocytes Absolute: 0.7 10*3/uL (ref 0.1–1.0)
Monocytes Relative: 8.4 % (ref 3.0–12.0)
NEUTROS ABS: 5.5 10*3/uL (ref 1.4–7.7)
NEUTROS PCT: 68 % (ref 43.0–77.0)
PLATELETS: 264 10*3/uL (ref 150.0–400.0)
RBC: 5.03 Mil/uL (ref 4.22–5.81)
RDW: 14.5 % (ref 11.5–15.5)
WBC: 8 10*3/uL (ref 4.0–10.5)

## 2018-02-06 LAB — COMPREHENSIVE METABOLIC PANEL
ALBUMIN: 4.1 g/dL (ref 3.5–5.2)
ALT: 34 U/L (ref 0–53)
AST: 22 U/L (ref 0–37)
Alkaline Phosphatase: 71 U/L (ref 39–117)
BUN: 17 mg/dL (ref 6–23)
CALCIUM: 9.7 mg/dL (ref 8.4–10.5)
CHLORIDE: 100 meq/L (ref 96–112)
CO2: 32 mEq/L (ref 19–32)
Creatinine, Ser: 1.05 mg/dL (ref 0.40–1.50)
GFR: 77.56 mL/min (ref >60.00)
Glucose, Bld: 132 mg/dL — ABNORMAL HIGH (ref 70–99)
POTASSIUM: 4.4 meq/L (ref 3.5–5.1)
Sodium: 138 mEq/L (ref 135–145)
Total Bilirubin: 0.4 mg/dL (ref 0.2–1.2)
Total Protein: 7.4 g/dL (ref 6.0–8.3)

## 2018-02-06 LAB — VITAMIN D 25 HYDROXY (VIT D DEFICIENCY, FRACTURES): VITD: 23.74 ng/mL — AB (ref 30.00–100.00)

## 2018-02-06 LAB — LIPID PANEL
CHOLESTEROL: 201 mg/dL — AB (ref 0–200)
HDL: 35.7 mg/dL — ABNORMAL LOW (ref >39.00)
LDL CALC: 140 mg/dL — AB (ref 0–99)
NonHDL: 165.76
TRIGLYCERIDES: 127 mg/dL (ref 0.0–149.0)
Total CHOL/HDL Ratio: 6
VLDL: 25.4 mg/dL (ref 0.0–40.0)

## 2018-02-06 LAB — TSH: TSH: 3.36 u[IU]/mL (ref 0.35–4.50)

## 2018-02-06 LAB — HEMOGLOBIN A1C: Hgb A1c MFr Bld: 6.6 % — ABNORMAL HIGH (ref 4.6–6.5)

## 2018-02-06 MED ORDER — MOMETASONE FUROATE 220 MCG/INH IN AEPB: 2.0000 | INHALATION_SPRAY | Freq: Every day | RESPIRATORY_TRACT | 11 refills | Status: DC

## 2018-02-06 MED ORDER — MONTELUKAST SODIUM 10 MG PO TABS: 10.0000 mg | ORAL_TABLET | Freq: Every day | ORAL | 3 refills | Status: DC

## 2018-02-06 MED ORDER — HYDROCHLOROTHIAZIDE 25 MG PO TABS: 25.0000 mg | ORAL_TABLET | Freq: Every day | ORAL | 1 refills | Status: DC

## 2018-02-06 NOTE — Patient Instructions (Signed)
1. We will collect your labs today and call you with results once we get them. Depending on A1c (diabetes screen) results is when we will need to see you next, we will let you know on the call back.  2. I have refilled your requested meds.  3. Refilled albuterol for you to fill if needed for emergency/shortness of breath.  4. Blood pressure is borderline, we will leave med the same for now and monitor closely--> try low sodium diet and increase exercise > 150 min a week.  5. If asthma/allergies flares, try adding either zyrtec or xyzal at night      Please help [place] help you:  We are honored you have chosen [facility] for your Primary Care home. Below you will find basic instructions that you may need to access in the future. Please help [place] help you by reading the instructions, which cover many of the frequent questions we experience.   Prescription refills and request:  -In order to allow more efficient response time, please call your pharmacy for all refills. They will forward the request electronically to [place]. This allows for the quickest possible response. Request left on a nurse line can take longer to refill, since these are checked as time allows between office patients and other phone calls.  - refill request can take up to 3-5 working days to complete.  - If request is sent electronically and request is appropiate, it is usually completed in 1-2 business days.  - all patients will need to be seen routinely for all chronic medical conditions requiring prescription medications (see follow-up below). If you are overdue for follow up on your condition, you will be asked to make an appointment and we will call in enough medication to cover you until your appointment (up to 30 days).  - all controlled substances will require a face to face visit to request/refill.  - if you desire your prescriptions to go through a new pharmacy, and have an active script at original pharmacy, you will need  to call your pharmacy and have scripts transferred to new pharmacy. This is completed between the pharmacy locations and not by your provider.    Results: If any images or labs were ordered, it can take up to 1 week to get results depending on the test ordered and the lab/facility running and resulting the test. - Normal or stable results, which do not need further discussion, may be released to your mychart immediately with attached note to you. A call may not be generated for normal results. Please make certain to sign up for mychart. If you have questions on how to activate your mychart you can call the front office.  - If your results need further discussion, our office will attempt to contact you via phone, and if unable to reach you after 2 attempts, we will release your abnormal result to your mychart with instructions.  - All results will be automatically released in mychart after 1 week.  - Your provider will provide you with explanation and instruction on all relevant material in your results. Please keep in mind, results and labs may appear confusing or abnormal to the untrained eye, but it does not mean they are actually abnormal for you personally. If you have any questions about your results that are not covered, or you desire more detailed explanation than what was provided, you should make an appointment with your provider to do so.   Our office handles many outgoing  and incoming calls daily. If we have not contacted you within 1 week about your results, please check your mychart to see if there is a message first and if not, then contact our office.  In helping with this matter, you help decrease call volume, and therefore allow [place] to be able to respond to patients needs more efficiently.   Acute office visits (sick visit):  An acute visit is intended for a new problem and are scheduled in shorter time slots to allow schedule openings for patients with new problems. This is the  appropriate visit to discuss a new problem. Problems will not be addressed by phone call or Echart message. Appointment is needed if requesting treatment. In order to provide you with excellent quality medical care with proper time for you to explain your problem, have an exam and receive treatment with instructions, these appointments should be limited to one new problem per visit. If you experience a new problem, in which you desire to be addressed, please make an acute office visit, we save openings on the schedule to accommodate you. Please do not save your new problem for any other type of visit, let us take care of it properly and quickly for you.   Follow up visits:  Depending on your condition(s) your provider will need to see you routinely in order to provide you with quality care and prescribe medication(s). Most chronic conditions (Example: hypertension, Diabetes, depression/anxiety... etc), require visits a couple times a year. Your provider will instruct you on proper follow up for your personal medical conditions and history. Please make certain to make follow up appointments for your condition as instructed. Failing to do so could result in lapse in your medication treatment/refills. If you request a refill, and are overdue to be seen on a condition, we will always provide you with a 30 day script (once) to allow you time to schedule.    Medicare wellness (well visit): - we have a wonderful Nurse Maudie Mercury), that will meet with you and provide you will yearly medicare wellness visits. These visits should occur yearly (can not be scheduled less than 1 calendar year apart) and cover preventive health, immunizations, advance directives and screenings you are entitled to yearly through your medicare benefits. Do not miss out on your entitled benefits, this is when medicare will pay for these benefits to be ordered for you.  These are strongly encouraged by your provider and is the appropriate type of  visit to make certain you are up to date with all preventive health benefits. If you have not had your medicare wellness exam in the last 12 months, please make certain to schedule one by calling the office and schedule your medicare wellness with Maudie Mercury as soon as possible.   Yearly physical (well visit):  - Adults are recommended to be seen yearly for physicals. Check with your insurance and date of your last physical, most insurances require one calendar year between physicals. Physicals include all preventive health topics, screenings, medical exam and labs that are appropriate for gender/age and history. You may have fasting labs needed at this visit. This is a well visit (not a sick visit), new problems should not be covered during this visit (see acute visit).  - Pediatric patients are seen more frequently when they are younger. Your provider will advise you on well child visit timing that is appropriate for your their age. - This is not a medicare wellness visit. Medicare wellness exams do not have an  exam portion to the visit. Some medicare companies allow for a physical, some do not allow a yearly physical. If your medicare allows a yearly physical you can schedule the medicare wellness with our nurse Maudie Mercury and have your physical with your provider after, on the same day. Please check with insurance for your full benefits.   Late Policy/No Shows:  - all new patients should arrive 15-30 minutes earlier than appointment to allow [place] time  to  obtain all personal demographics,  insurance information and for you to complete office paperwork. - All established patients should arrive 10-15 minutes earlier than appointment time to update all information and be checked in .  - In our best efforts to run on time, if you are late for your appointment you will be asked to either reschedule or if able, we will work you back into the schedule. There will be a wait time to work you back in the schedule,  depending  on availability.  - If you are unable to make it to your appointment as scheduled, please call 24 hours ahead of time to allow [place] to fill the time slot with someone else who needs to be seen. If you do not cancel your appointment ahead of time, you may be charged a no show fee.

## 2018-02-06 NOTE — Progress Notes (Signed)
Patient ID: Adrian [name], male  DOB: [birth year], 57 y.o.   MRN: [id number] Patient Care Team    Relationship Specialty Notifications Start End  Ma Hillock, DO PCP - General Family Medicine  02/06/18   Tanda Rockers, MD Consulting Physician Pulmonary Disease  02/07/18   Otelia Sergeant, OD Referring Physician   02/07/18     Chief Complaint  Patient presents with  . Establish Care    Subjective:  Adrian [name] is a 57 y.o.  male present for new patient establishment. All past medical history, surgical history, allergies, family history, immunizations, medications and social history were updated in the electronic medical record today. All recent labs, ED visits and hospitalizations within the last year were reviewed.  Essential hypertension/hyperlipidemia/morbid obesity Pt reports compliance with HCTZ 25 mg daily. Blood pressures ranges at home not routinely checked. Patient denies chest pain, shortness of breath or lower extremity edema. Pt is not taking a daily baby ASA. Pt is not prescribed statin 2/2 intolerance x2. BMP: Collected today. CBC: Collected today Lipid: Collected today TSH: Collected today Diet: low Salt Exercise: Routine exercise RF: Hypertension, hyperlipidemia, obesity, diabetes.   Mild asthma without complication, unspecified whether persistent/ OSA on CPAP Patient reports he has mild persistent asthma.  He uses the Flonase nasal spray as needed, Asmanex 2 puffs daily as needed and Singulair nightly.  Overall he feels his asthma is rather controlled.  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  He sustained a PE after traveling to [place] last summer.  He is on Xarelto.  He reports he meets with his pulmonologist within the next couple weeks and further discussion will be had on length of anticoagulation.  Type 2 diabetes mellitus without complication, without long-term current use of insulin  (HCC) Pt reports he was never told he was a diabetic.  Never been started on medications that he is aware of.  She saw it on his last PCPs check out sheet, had not been told directly.  Last A1c appreciated in the care everywhere tab was 6.5.  Denies numbness, tingling of extremities, hypo/hyperglycemic events or non-healing wounds.  PNA series: Pneumococcal 23 02/02/2017--> offer Prevnar next visit Flu shot: (recommneded yearly) BMP: Collected today Microalbumin: Collect next visit Foot exam: Complete next visit if diabetic Eye exam:10/17/2017, Dr. Oswaldo Conroy,  [facility] A1c: Collected today to confirm diagnosis   Vitamin D deficiency H/o.  Currently not taking the counter supplementation.   Depression screen [facility] 2/9 02/06/2018  Decreased Interest 0  Down, Depressed, Hopeless 0  PHQ - 2 Score 0   No flowsheet data found.    Current Exercise Habits: Structured exercise class, Type of exercise: Other - see comments(swimming), Time (Minutes): 60, Frequency (Times/Week): 3, Weekly Exercise (Minutes/Week): 180, Intensity: Moderate Exercise limited by: cardiac condition(s) Fall Risk  02/06/2018  Falls in the past year? No     Immunization History  Administered Date(s) Administered  . Influenza Whole 04/22/2017  . Influenza,inj,quad, With Preservative 05/20/2015  . Pneumococcal Polysaccharide-23 02/02/2017    No exam data present  Past Medical History:  Diagnosis Date  . Allergy   . Asthma   . Blood in stool   . Chicken pox   . Colon polyps   . Diabetes mellitus without complication ([place])   . Diverticulosis   . Frequent headaches   . Genital warts   . GERD (gastroesophageal reflux disease)   . Hyperlipidemia   .  Hypertension   . Thyroid disease    Allergies  Allergen Reactions  . Lisinopril Cough  . Statins Other (See Comments)    Causes pain/ foot trouble   Past Surgical History:  Procedure Laterality Date  . ANKLE SURGERY     Family History  Problem Relation  Age of Onset  . Asthma Mother   . Arthritis Mother   . Allergies Brother   . Alcohol abuse Maternal Grandmother   . Cancer Maternal Grandmother    Social History   Socioeconomic History  . Marital status: Married    Spouse name: Not on file  . Number of children: Not on file  . Years of education: Not on file  . Highest education level: Not on file  Occupational History  . Not on file  Social Needs  . Financial resource strain: Not on file  . Food insecurity:    Worry: Not on file    Inability: Not on file  . Transportation needs:    Medical: Not on file    Non-medical: Not on file  Tobacco Use  . Smoking status: Never Smoker  . Smokeless tobacco: Never Used  Substance and Sexual Activity  . Alcohol use: Yes    Alcohol/week: 1.8 oz    Types: 3 Glasses of wine per week  . Drug use: No  . Sexual activity: Yes    Partners: Female  Lifestyle  . Physical activity:    Days per week: Not on file    Minutes per session: Not on file  . Stress: Not on file  Relationships  . Social connections:    Talks on phone: Not on file    Gets together: Not on file    Attends religious service: Not on file    Active member of club or organization: Not on file    Attends meetings of clubs or organizations: Not on file    Relationship status: Not on file  . Intimate partner violence:    Fear of current or ex partner: Not on file    Emotionally abused: Not on file    Physically abused: Not on file    Forced sexual activity: Not on file  Other Topics Concern  . Not on file  Social History Narrative   Marital status/children/pets: Married.   Education/employment: Associates degree, employed as a Armed forces operational officer:      -Wears a bicycle helmet riding a bike: No     -smoke alarm in the home:Yes     - wears seatbelt: Yes     - Feels safe in their relationships: Yes   Allergies as of 02/06/2018      Reactions   Lisinopril Cough   Statins Other (See Comments)   Causes  pain/ foot trouble      Medication List        Accurate as of 02/06/18 11:59 PM. Always use your most recent med list.          hydrochlorothiazide 25 MG tablet Commonly known as:  HYDRODIURIL Take 1 tablet (25 mg total) by mouth daily.   KLS ALLER-FLO 50 MCG/ACT nasal spray Generic drug:  fluticasone Place into both nostrils daily.   mometasone 220 MCG/INH inhaler Commonly known as:  ASMANEX Inhale 2 puffs into the lungs daily.   montelukast 10 MG tablet Commonly known as:  SINGULAIR Take 1 tablet (10 mg total) by mouth at bedtime.   rivaroxaban 20 MG Tabs tablet Commonly known as:  XARELTO Take  1 tablet (20 mg total) by mouth daily with supper.   UNABLE TO FIND Med Name: CPAP       All past medical history, surgical history, allergies, family history, immunizations andmedications were updated in the EMR today and reviewed under the history and medication portions of their EMR.    Recent Results (from the past 2160 hour(s))  CBC w/Diff     Status: None   Collection Time: 02/06/18  9:29 AM  Result Value Ref Range   WBC 8.0 4.0 - 10.5 K/uL   RBC 5.03 4.22 - 5.81 Mil/uL   Hemoglobin 14.4 13.0 - 17.0 g/dL   HCT 43.2 39.0 - 52.0 %   MCV 85.7 78.0 - 100.0 fl   MCHC 33.4 30.0 - 36.0 g/dL   RDW 14.5 11.5 - 15.5 %   Platelets 264.0 150.0 - 400.0 K/uL   Neutrophils Relative % 68.0 43.0 - 77.0 %   Lymphocytes Relative 20.4 12.0 - 46.0 %   Monocytes Relative 8.4 3.0 - 12.0 %   Eosinophils Relative 2.1 0.0 - 5.0 %   Basophils Relative 1.1 0.0 - 3.0 %   Neutro Abs 5.5 1.4 - 7.7 K/uL   Lymphs Abs 1.6 0.7 - 4.0 K/uL   Monocytes Absolute 0.7 0.1 - 1.0 K/uL   Eosinophils Absolute 0.2 0.0 - 0.7 K/uL   Basophils Absolute 0.1 0.0 - 0.1 K/uL  TSH     Status: None   Collection Time: 02/06/18  9:29 AM  Result Value Ref Range   TSH 3.36 0.35 - 4.50 uIU/mL  Comp Met (CMET)     Status: Abnormal   Collection Time: 02/06/18  9:29 AM  Result Value Ref Range   Sodium 138 135 - 145  mEq/L   Potassium 4.4 3.5 - 5.1 mEq/L   Chloride 100 96 - 112 mEq/L   CO2 32 19 - 32 mEq/L   Glucose, Bld 132 (H) 70 - 99 mg/dL   BUN 17 6 - 23 mg/dL   Creatinine, Ser 1.05 0.40 - 1.50 mg/dL   Total Bilirubin 0.4 0.2 - 1.2 mg/dL   Alkaline Phosphatase 71 39 - 117 U/L   AST 22 0 - 37 U/L   ALT 34 0 - 53 U/L   Total Protein 7.4 6.0 - 8.3 g/dL   Albumin 4.1 3.5 - 5.2 g/dL   Calcium 9.7 8.4 - 10.5 mg/dL   GFR 77.56 >60.00 mL/min  Lipid panel     Status: Abnormal   Collection Time: 02/06/18  9:29 AM  Result Value Ref Range   Cholesterol 201 (H) 0 - 200 mg/dL    Comment: ATP III Classification       Desirable:  < 200 mg/dL               Borderline High:  200 - 239 mg/dL          High:  > = 240 mg/dL   Triglycerides 127.0 0.0 - 149.0 mg/dL    Comment: Normal:  <150 mg/dLBorderline High:  150 - 199 mg/dL   HDL 35.70 (L) >39.00 mg/dL   VLDL 25.4 0.0 - 40.0 mg/dL   LDL Cholesterol 140 (H) 0 - 99 mg/dL   Total CHOL/HDL Ratio 6     Comment:                Men          Women1/2 Average Risk     3.4  3.3Average Risk          5.0          4.42X Average Risk          9.6          7.13X Average Risk          15.0          11.0                       NonHDL 165.76     Comment: NOTE:  Non-HDL goal should be 30 mg/dL higher than patient's LDL goal (i.e. LDL goal of < 70 mg/dL, would have non-HDL goal of < 100 mg/dL)  HgB A1c     Status: Abnormal   Collection Time: 02/06/18  9:29 AM  Result Value Ref Range   Hgb A1c MFr Bld 6.6 (H) 4.6 - 6.5 %    Comment: Glycemic Control Guidelines for People with Diabetes:Non Diabetic:  <6%Goal of Therapy: <7%Additional Action Suggested:  >8%   Vitamin D (25 hydroxy)     Status: Abnormal   Collection Time: 02/06/18  9:29 AM  Result Value Ref Range   VITD 23.74 (L) 30.00 - 100.00 ng/mL    No results found.   ROS: 14 pt review of systems performed and negative (unless mentioned in an HPI)  Objective: BP 132/81 (BP Location: Left Arm, Patient Position:  Sitting, Cuff Size: Large)   Pulse (!) 54   Temp 98.5 F (36.9 C) (Oral)   Resp 16   Ht 6' (1.829 m)   Wt (!) 350 lb (158.8 kg)   SpO2 95%   BMI 47.47 kg/m  Gen: Afebrile. No acute distress. Nontoxic in appearance, well-developed, well-nourished, obese, pleasant, Caucasian male HENT: AT. [place].MMM, no cough on exam, no hoarseness on exam. Eyes:Pupils Equal Round Reactive to light, Extraocular movements intact,  Conjunctiva without redness, discharge or icterus. Neck/lymp/endocrine: Supple, no lymphadenopathy, no thyromegaly CV: RRR no murmur, no edema, +2/4 P posterior tibialis pulses.  No carotid bruits. No JVD. Chest: CTAB, no wheeze, rhonchi or crackles.  Normal respiratory effort.  Good air movement. Abd: Soft.  Obese. NTND. BS active.  No masses palpated. No hepatosplenomegaly. No rebound tenderness or guarding. Skin: no rashes, purpura or petechiae. Warm and well-perfused.  Left anterior shin with mild swelling, excoriated scab with serosanguineous drainage. Neuro/Msk:  Normal gait. PERLA. EOMi. Alert. Oriented x3.   Psych: Normal affect, dress and demeanor. Normal speech. Normal thought content and judgment.   Assessment/plan: Adrian [name] is a 57 y.o. male present for tablets care Essential hypertension/hyperlipidemia/morbid obesity -Borderline but stable.  Continue HCTZ 25 mg daily.  Advised him to increase his exercise greater than 150 minutes a week.  Follow a diabetic diet/heart healthy, cutting back on sugar, salt and carbohydrate consumption. -Developed a cough with lisinopril, however would benefit from an arb if he is a diabetic and needing further blood pressure management. -If cholesterol elevated consider Zetia or WelChol since he is statin intolerant. - CBC w/Diff - TSH - Comp Met (CMET) - TSH - Lipid panel -Follow-up 3 months with other chronic medical conditions, if blood pressure elevated again will add to his regimen.  PE (pulmonary thromboembolism)  (HCC)/obstructive sleep apnea on CPAP/asthma -Continue Xarelto.  Medication managed by pulmonology, Dr. Melvyn Novas.  Continue routine follow-ups with Dr. Melvyn Novas. -CPAP managed by [place] [facility] disorder clinic -Asthma well-controlled.  Refilled Singulair, Asmanex.  Type 2 diabetes mellitus without complication, without  long-term current use of insulin (HCC) PNA series: Pneumococcal 23 02/02/2017--> offer Prevnar next visit Flu shot: (recommneded yearly) BMP: Collected today Microalbumin: Collect next visit Foot exam: Complete next visit if diabetic Eye exam:10/17/2017, Dr. Barts,  [facility] A1c: Collected today to confirm diagnosis -A1c elevated will start metformin. - HgB A1c -Follow-up 3 months  Vitamin D deficiency - Vitamin D (25 hydroxy)  Wound of left lower extremity, initial encounter Area of injury approximately 3-4 months ago.  His reports sound like he had a hematoma at this location.  Still some residual swelling and open wound.  No signs of infection.  Continue to monitor closely.  If area becomes red, fevered, drainage becomes purulent or comes painful to follow-up immediately.  May need to consider wound care/Unna boot application.  He reports it is improving,  healing very slowly.   Return in about 3 months (around 05/09/2018) for Chronic medical conditions.   Note is dictated utilizing voice recognition software. Although note has been proof read prior to signing, occasional typographical errors still can be missed. If any questions arise, please do not hesitate to call for verification.  Electronically signed by: Renee Kuneff, DO [facility]  

## 2018-02-07 ENCOUNTER — Telehealth: Payer: Self-pay | Admitting: Family Medicine

## 2018-02-07 ENCOUNTER — Encounter: Payer: Self-pay | Admitting: Family Medicine

## 2018-02-07 DIAGNOSIS — S81802A Unspecified open wound, left lower leg, initial encounter: Secondary | ICD-10-CM | POA: Insufficient documentation

## 2018-02-07 MED ORDER — EZETIMIBE 10 MG PO TABS: 10.0000 mg | ORAL_TABLET | Freq: Every day | ORAL | 3 refills | Status: DC

## 2018-02-07 MED ORDER — BLOOD GLUCOSE MONITOR KIT: PACK | 0 refills | Status: DC

## 2018-02-07 MED ORDER — METFORMIN HCL 500 MG PO TABS: 500.0000 mg | ORAL_TABLET | Freq: Two times a day (BID) | ORAL | 1 refills | Status: DC

## 2018-02-07 NOTE — Telephone Encounter (Signed)
Please inform patient the following information: - His kidney function is good. - He is a diabetic, but just barely over the threshold of diabetes.  Increasing exercise greater than 150 minutes and eating a diabetic diet low in carbohydrates and sugar will help control his diabetes.  I would also recommend starting metformin.  I have called this in for him.  This medication does not work like insulin in which she has to worry about having low sugars.  In the beginning some people do have GI discomfort or diarrhea as tapering on dose but this typically resolves within a few weeks if he does experience it. -If he is interested I can also refer him to a nutritionist to discuss a diabetic diet. -We will send in a glucose monitoring kit (plesse send in), I would like him to test his blood sugar to random times throughout the day and record into a notebook, bring this to his next appointment with him. -Vitamin D remains low at 23.  I would recommend he take 1000 units of vitamin D daily, with meals.  This is over-the-counter. -His cholesterol is okay, his bad cholesterol is 140 and is best below 130 and closer to 100.  Have called in a medication called Zetia, this is not a statin but can help lower his cholesterol. -I would advise him to wait at least 2 weeks to start Zetia after metformin just in case he has a side effect is not confusing which new medication is causing. -Follow-up 3 months -Please order glucose monitoring kit and supplies

## 2018-02-07 NOTE — Telephone Encounter (Signed)
Left detailed message with results and instructions on patient voice mail per [facility] sent glucose monitor Rx to patient pharmacy. If patient calls back OK for PEC Triage to review instructions with patient.

## 2018-02-19 ENCOUNTER — Encounter: Payer: Self-pay | Admitting: Family Medicine

## 2018-02-20 ENCOUNTER — Encounter: Payer: Self-pay | Admitting: Family Medicine

## 2018-03-03 ENCOUNTER — Encounter: Payer: Self-pay | Admitting: Internal Medicine

## 2018-03-03 ENCOUNTER — Ambulatory Visit: Payer: Federal, State, Local not specified - PPO | Admitting: Internal Medicine

## 2018-03-03 VITALS — BP 134/80 | HR 60 | Ht 72.0 in | Wt 348.0 lb

## 2018-03-03 DIAGNOSIS — I2699 Other pulmonary embolism without acute cor pulmonale: Secondary | ICD-10-CM

## 2018-03-03 DIAGNOSIS — J4530 Mild persistent asthma, uncomplicated: Secondary | ICD-10-CM

## 2018-03-03 MED ORDER — RIVAROXABAN 10 MG PO TABS: 10.0000 mg | ORAL_TABLET | Freq: Every day | ORAL | 5 refills | Status: DC

## 2018-03-03 MED ORDER — MOMETASONE FUROATE 220 MCG/INH IN AEPB: 2.0000 | INHALATION_SPRAY | Freq: Every day | RESPIRATORY_TRACT | 0 refills | Status: DC

## 2018-03-03 NOTE — Patient Instructions (Signed)
Work on inhaler technique:  relax and gently blow all the way out then take a nice smooth deep breath back in, triggering the inhaler at same time you start breathing in.  Hold for up to 5 seconds if you can. Blow out thru nose. Rinse and gargle with water when done  If cough worse can try asmanex 200 up to 2 puffs every 12 hours   Reduce xarelto to 10 mg daily - hold if active bleeding from any source   Please schedule a follow up visit in 6 months but call sooner if needed

## 2018-03-03 NOTE — Progress Notes (Signed)
Subjective:     Patient ID: Adrian [name], male   DOB: Jun 05, 1961,   MRN: [id number]     Brief patient profile:  57 yowm morbidly  obese complicated by osa/ cpap started April 2018 with tendency to leg cramps and gradual doe and then much worse p arrived back from car trip to [place] with dx of L dvt/ PE 01/2017 with neg hypercoagulable w/u on initial eval    Admit date: 01/31/2017 Discharge date: 02/03/2017   Discharge Diagnoses:  Principal Problem:   Acute PE (pulmonary thromboembolism) ([place])   Secondary pulmonary hypertension   Obesity   Hypertension   Asthma   CKD (chronic kidney disease), stage III   Acute respiratory failure with hypoxia (HCC)   OSA on CPAP   Diarrhea   Type 2 diabetes mellitus without complication ([place])   Pulmonary hypertension ([place])         Filed Weights   01/31/17 2030 02/01/17 0118  Weight: (!) 159.2 kg (351 lb) (!) 159.2 kg (351 lb)    History of present illness:  REASON HELZER a 57 y.o.malewith medical history significant of hypertension, asthma, CKD-3, OSA on CPAP, who presented with shortness of breath in the right flank pain. Pt recently drove to [facility] 19 days pta and drove back from [place] one week pta for vacation. Pt states that his SOB started soon after he arrived at home In ED:pt was found to be hypoxic, positive d-dimer 7.24, CT angiogram showed bilateral PE and evidence of right heart strain  Hospital Course:  Acute hypoxic resp failure -due to Acute PE, also has underlying OSA and asthma -hemodynamically stable, normotensive without hypoxia or significant dyspnea -started onIV heparin on admission and then transitionedto Xarelto,discussed DOACs and warfarin with pt  -ECHO notes moderate to severe Pulm HTN and RV dysfunction, suspect significant component of his Pulm HTN is related to his severe OSA/OHS and rest to his acute PE which part should get better with current anticoagulation -  needs Pulm FU, both  these recommendations given to patient and wife -Ambulated in halls without much symptoms -clinically felt to be stable for discharge home on Xarelto at this time  Acute Bilateral PE -as above -risk factor: recent travel and sedentary lifestyle - venous dopplers 02/02/17 >  acute deep vein thrombosis involving the posterior tibial veins of the left lower extremity.  Hypertension -Bp soft but stable  -resumed HCTZ and stopped ARB  Asthma -stable, nebs PRN  CKD (chronic kidney disease), stage 2 -creatinine in normal range now, was 1.4 on admission -improved and 0.9 at discharge  Severe OSA  -continue CPAP  Hyperglycemia/New DM -Hba1c is 6.5 -diet/weight loss and life style modification recommended -will need Rx down the road, consider Metformin first     03/13/2017 1st [facility] office visit/ Nayelis Bonito   Chief Complaint  Patient presents with  . Advice Only    Referred by Dr. Elesa Hacker due to hx of pulmonary htn. Pt had a bilateral pulmonary embolism and was in hospital 01/31/17-02/03/17. Pt states that he is now breathing better than he had been.  no symptoms localized to L leg pre or post rx with xarelto but rather noct cramping in both  Breathing better than it was even in the last year pta H/o allergy/asthma since childhood maint on asthmex and singulair but not cough / noct wheeze or need for saba  rec No change in xarelto or asthma medications except for albuterol as follows  Only use your  albuterol as a rescue medication to be used if you can't catch your breath by resting or doing a relaxed purse lip breathing pattern.  - The less you use it, the better it will work when you need it. - Ok to use up to 2 puffs  every 4 hours if you must but call for immediate appointment if use goes up over your usual need - Don't leave home without it !!  (think of it like the spare tire for your car)   Wear the elastic stockings as much as you can   After the  first of the year you will need a repeat echo and venous doppler  Venous dopplers 07/24/17 >  Neg  ECHO 08/12/17> no PH    08/13/2017  f/u ov/Cadee Agro re: s/p PE rx since 01/2017  xarelto Chief Complaint  Patient presents with  . Follow-up    Breathing is doing well. He does c/o some right side low back pain.    positional R sided mid/ lower back pain s pleuritic features or urinary/ gi symptoms Doe = MMRC1 = can walk nl pace, flat grade, can't hurry or go uphills or steps s sob   rec Continue xarelto 20 mg daily  Weight control is simply a matter of calorie balance which needs to be tilted in your favor by eating less and exercising more.  To get the most out of exercise, you need to be continuously aware that you are short of breath, but never out of breath, for 30 minutes daily.    03/03/2018  f/u ov/Ivelisse Culverhouse re:  Remote pe  maint on xarelto 20 mg /day based on MO risk Chief Complaint  Patient presents with  . Follow-up    Breathing is doing well and no co's.    Dyspnea:  Best it's been in 10 years - still sob x 2 flights Cough: some in am/ always relates to sensation nasal drainage despite singulair /flonase and asmanex 200 2 each am Sleeping: on cpap/ flat SABA use: no 02: no    No obvious day to day or daytime variability or assoc excess/ purulent sputum or mucus plugs or hemoptysis or cp or chest tightness, subjective wheeze or overt sinus or hb symptoms.   Sleeping as above without nocturnal  or early am exacerbation  of respiratory  c/o's or need for noct saba. Also denies any obvious fluctuation of symptoms with weather or environmental changes or other aggravating or alleviating factors except as outlined above   No unusual exposure hx or h/o childhood pna/ asthma or knowledge of premature birth.  Current Allergies, Complete Past Medical History, Past Surgical History, Family History, and Social History were reviewed in [employer] record.  ROS  The  following are not active complaints unless bolded Hoarseness, sore throat, dysphagia, dental problems, itching, sneezing,  nasal congestion or discharge of excess mucus or purulent secretions, ear ache,   fever, chills, sweats, unintended wt loss or wt gain, classically pleuritic or exertional cp,  orthopnea pnd or arm/hand swelling  or leg swelling, presyncope, palpitations, abdominal pain, anorexia, nausea, vomiting, diarrhea  or change in bowel habits or change in bladder habits, change in stools or change in urine, dysuria, hematuria,  rash, arthralgias, visual complaints, headache, numbness, weakness or ataxia or problems with walking or coordination,  change in mood or  memory.        Current Meds  Medication Sig  . blood glucose meter kit and supplies KIT Dispense based  on patient and insurance preference. Use up to 2 times daily as directed.  . ezetimibe (ZETIA) 10 MG tablet Take 1 tablet (10 mg total) by mouth daily.  . fluticasone (KLS ALLER-FLO) 50 MCG/ACT nasal spray Place into both nostrils daily.  . hydrochlorothiazide (HYDRODIURIL) 25 MG tablet Take 1 tablet (25 mg total) by mouth daily.  . metFORMIN (GLUCOPHAGE) 500 MG tablet Take 1 tablet (500 mg total) by mouth 2 (two) times daily with a meal.  . montelukast (SINGULAIR) 10 MG tablet Take 1 tablet (10 mg total) by mouth at bedtime.  Marland Kitchen UNABLE TO FIND Med Name: CPAP  . asmanex 200  Inhale 2 puffs into the lungs daily.  . _0  rivaroxaban (XARELTO) 20 MG TABS tablet Take 1 tablet (20 mg total) by mouth daily with supper.                          Objective:   Physical Exam    Pleasant amb obese wm    03/03/2018          348   08/13/2017           356   03/13/17 (!) 347 lb 6.4 oz (157.6 kg)  02/01/17 (!) 351 lb (159.2 kg)  10/15/15 (!) 350 lb (158.8 kg)     Vital signs reviewed - Note on arrival 02 sats 94 % on RA     HEENT: nl dentition, turbinates bilaterally, and oropharynx. Nl external ear canals without cough  reflex   NECK :  without JVD/Nodes/TM/ nl carotid upstrokes bilaterally   LUNGS: no acc muscle use,  Nl contour chest which is clear to A and P bilaterally without cough on insp or exp maneuvers   CV:  RRR  no s3 or murmur or increase in P2, and no edema   ABD:  Quite obese nontender with nl inspiratory excursion in the supine position. No bruits or organomegaly appreciated, bowel sounds nl  MS:  Nl gait/ ext warm without deformities, calf tenderness, cyanosis or clubbing No obvious joint restrictions   SKIN: warm and dry without lesions    NEURO:  alert, approp, nl sensorium with  no motor or cerebellar deficits apparent.         .         Assessment:

## 2018-03-07 ENCOUNTER — Telehealth: Payer: Self-pay | Admitting: *Deleted

## 2018-03-07 ENCOUNTER — Encounter: Payer: Self-pay | Admitting: Internal Medicine

## 2018-03-07 DIAGNOSIS — E119 Type 2 diabetes mellitus without complications: Secondary | ICD-10-CM

## 2018-03-07 NOTE — Assessment & Plan Note (Signed)
Dx 01/31/17 with assoc acute L DVT and neg hypercoagulable w/u  - Echo 02/01/17 Left ventricle: The cavity size was mildly dilated. There was   moderate hypertrophy of the septum with otherwise mild concentric   hypertrophy. Systolic function was normal. Wall motion was   normal; there were no regional wall motion abnormalities. Doppler   parameters are consistent with abnormal left ventricular   relaxation (grade 1 diastolic dysfunction). Doppler parameters   are consistent with indeterminate ventricular filling pressure. - Aortic valve: Transvalvular velocity was within the normal range.   There was no stenosis. There was no regurgitation. - Mitral valve: Transvalvular velocity was within the normal range.   There was no evidence for stenosis. There was no regurgitation. - Right ventricle: The cavity size was severely dilated. Wall   thickness was normal. Systolic function was severely reduced. - Tricuspid valve: There was mild regurgitation. - Pulmonary arteries: Systolic pressure was severely increased. PA   peak pressure: 61 mm Hg (S). Venous dopplers 07/24/17 >  Neg  ECHO 08/12/17> no PH but Pos LAE  - 03/03/2018 rec change xarelto to 10 mg daily as maint duet to MO/ h/o semiprovoked PE    Discussed in detail all the  indications, usual  risks and alternatives  relative to the benefits with patient who agrees to proceed with rx as outlined indefinitely or until gets BMI < 30

## 2018-03-07 NOTE — Assessment & Plan Note (Signed)
03/03/2018  After extensive coaching inhaler device,  effectiveness =    75% from a baseline of 50%   rec try asmanex up to 200 2bid and if still bothered by cough return for further w/u to include allergy profile/ sinus ct and trial of 1st gen H1 blockers per guidelines     I had an extended discussion with the patient reviewing all relevant studies completed to date and  lasting 15 to 20 minutes of a 25 minute visit    See device teaching which extended face to face time for this visit.  Each maintenance medication was reviewed in detail including emphasizing most importantly the difference between maintenance and prns and under what circumstances the prns are to be triggered using an action plan format that is not reflected in the computer generated alphabetically organized AVS which I have not found useful in most complex patients, especially with respiratory illnesses  Please see AVS for specific instructions unique to this visit that I personally wrote and verbalized to the the pt in detail and then reviewed with pt  by my nurse highlighting any  changes in therapy recommended at today's visit to their plan of care.

## 2018-03-07 NOTE — Telephone Encounter (Signed)
Copied from [place] [id number]. Topic: Referral - Request >> Mar 07, 2018 11:56 AM Bea Graff, NT wrote: Reason for CRM: Pt requesting a referral to a nutritionist. He states that this was mentioned during his last appt with Dr. Raoul Pitch.

## 2018-03-07 NOTE — Assessment & Plan Note (Signed)
complicated by hbp and dvt/pe 01/2017   Body mass index is 47.2 kg/m.  -  trending down/ strongly encouraged Lab Results  Component Value Date   TSH 3.36 02/06/2018     Contributing to gerd risk/ doe/reviewed the need and the process to achieve and maintain neg calorie balance > defer f/u primary care including intermittently monitoring thyroid status

## 2018-03-10 ENCOUNTER — Ambulatory Visit: Payer: Federal, State, Local not specified - PPO | Admitting: Family Medicine

## 2018-03-10 NOTE — Telephone Encounter (Signed)
nutrition referral placed per pt request.

## 2018-04-10 DIAGNOSIS — G4733 Obstructive sleep apnea (adult) (pediatric): Secondary | ICD-10-CM | POA: Diagnosis not present

## 2018-04-15 ENCOUNTER — Ambulatory Visit: Payer: Federal, State, Local not specified - PPO

## 2018-04-17 DIAGNOSIS — K080 Exfoliation of teeth due to systemic causes: Secondary | ICD-10-CM | POA: Diagnosis not present

## 2018-04-20 ENCOUNTER — Emergency Department (HOSPITAL_BASED_OUTPATIENT_CLINIC_OR_DEPARTMENT_OTHER)
Admission: EM | Admit: 2018-04-20 | Discharge: 2018-04-20 | Disposition: A | Discharge status: Home / Self Care | Payer: Federal, State, Local not specified - PPO | Attending: Emergency Medicine | Admitting: Emergency Medicine

## 2018-04-20 ENCOUNTER — Other Ambulatory Visit: Payer: Self-pay

## 2018-04-20 DIAGNOSIS — J45909 Unspecified asthma, uncomplicated: Secondary | ICD-10-CM | POA: Diagnosis not present

## 2018-04-20 DIAGNOSIS — Z79899 Other long term (current) drug therapy: Secondary | ICD-10-CM | POA: Insufficient documentation

## 2018-04-20 DIAGNOSIS — I129 Hypertensive chronic kidney disease with stage 1 through stage 4 chronic kidney disease, or unspecified chronic kidney disease: Secondary | ICD-10-CM | POA: Insufficient documentation

## 2018-04-20 DIAGNOSIS — Z7901 Long term (current) use of anticoagulants: Secondary | ICD-10-CM | POA: Insufficient documentation

## 2018-04-20 DIAGNOSIS — E1122 Type 2 diabetes mellitus with diabetic chronic kidney disease: Secondary | ICD-10-CM | POA: Diagnosis not present

## 2018-04-20 DIAGNOSIS — R109 Unspecified abdominal pain: Secondary | ICD-10-CM

## 2018-04-20 DIAGNOSIS — Z7984 Long term (current) use of oral hypoglycemic drugs: Secondary | ICD-10-CM | POA: Diagnosis not present

## 2018-04-20 DIAGNOSIS — N183 Chronic kidney disease, stage 3 (moderate): Secondary | ICD-10-CM | POA: Insufficient documentation

## 2018-04-20 DIAGNOSIS — Z86711 Personal history of pulmonary embolism: Secondary | ICD-10-CM | POA: Diagnosis not present

## 2018-04-20 LAB — URINALYSIS, ROUTINE W REFLEX MICROSCOPIC
BILIRUBIN URINE: NEGATIVE
Glucose, UA: NEGATIVE mg/dL
KETONES UR: NEGATIVE mg/dL
Leukocytes, UA: NEGATIVE
NITRITE: NEGATIVE
PH: 6 (ref 5.0–8.0)
Protein, ur: NEGATIVE mg/dL
SPECIFIC GRAVITY, URINE: 1.02 (ref 1.005–1.030)

## 2018-04-20 LAB — URINALYSIS, MICROSCOPIC (REFLEX)
Bacteria, UA: NONE SEEN
WBC UA: NONE SEEN WBC/hpf (ref 0–5)

## 2018-04-20 MED ORDER — OXYCODONE HCL 5 MG PO TABS
10.0000 mg | ORAL_TABLET | Freq: Once | ORAL | Status: AC
  Administered 2018-04-20: 10 mg via ORAL
  Filled 2018-04-20: qty 2

## 2018-04-20 NOTE — ED Notes (Signed)
Pt aware of need for urine sample as soon as it can be provided.

## 2018-04-20 NOTE — Discharge Instructions (Addendum)

## 2018-04-20 NOTE — ED Triage Notes (Signed)
Intermittent sharp R flank pain x 1 week. Denies urinary sx. Pain woke him from sleep this morning. Denies nausea.

## 2018-04-20 NOTE — ED Provider Notes (Signed)
[facility] Provider Note   CSN: [id number] Arrival date & time: 04/20/18  4235     History   Chief Complaint Chief Complaint  Patient presents with  . Flank Pain    HPI Adrian [name] is a 57 y.o. male.  The history is provided by the patient and the spouse.  Flank Pain  This is a new problem. The current episode started more than 1 week ago. The problem occurs daily. The problem has been gradually worsening. Associated symptoms include abdominal pain. Pertinent negatives include no chest pain and no shortness of breath. Nothing aggravates the symptoms. Nothing relieves the symptoms.   Patient reports intermittent sharp right flank pain for the past week.  Tonight the pain became abruptly worse.  Denies any urinary symptoms.  The pain does not radiate.  No weakness.  No fevers or vomiting. Denies chest pain/shortness of breath Past Medical History:  Diagnosis Date  . Adenomatous colon polyp    FS 02/2004; colo 2005 hyperplastic polyp; 2010 neg - butmucosal prolapse.   . Allergy   . Asthma   . Blood in stool   . Chicken pox   . Colon polyps   . Diabetes mellitus without complication ([place]) 36/1443  . Diverticulosis   . Frequent headaches   . Genital warts   . GERD (gastroesophageal reflux disease)   . Hemorrhoid   . Hyperlipidemia   . Hypertension   . Hypogonadism in male    labs x2: 11/14/2015 - 216 and 11/17/2015 193; declined med at that time.   . Insomnia   . OSA on CPAP 09/18/2016   HST 09/18/2016; ESS-4; AHI-76; O2 min- 61%  . Pulmonary embolism ([place]) 01/2017   provoked by long care ride, on xarelto, est Dr. Melvyn Novas  . Thyroid disease    subclinical hypothyroidism per records    Patient Active Problem List   Diagnosis Date Noted  . Wound of left leg 02/07/2018  . Vitamin D deficiency 02/06/2018  . Mixed hyperlipidemia 02/06/2018  . Mild persistent asthma without complication 15/40/0867  . Morbid obesity due to excess calories  ([place]) 03/14/2017  . Type 2 diabetes mellitus without complication ([place]) 61/95/0932  . Pulmonary hypertension ([place]) 02/02/2017  . OSA on CPAP 02/01/2017  . PE (pulmonary thromboembolism) ([place]) 01/31/2017  . CKD (chronic kidney disease), stage III ([place]) 01/31/2017  . Hypertension     Past Surgical History:  Procedure Laterality Date  . ANKLE SURGERY          Home Medications    Prior to Admission medications   Medication Sig Start Date End Date Taking? Authorizing Provider  blood glucose meter kit and supplies KIT Dispense based on patient and insurance preference. Use up to 2 times daily as directed. 02/07/18   Kuneff, Renee A, DO  ezetimibe (ZETIA) 10 MG tablet Take 1 tablet (10 mg total) by mouth daily. 02/07/18   Kuneff, Renee A, DO  fluticasone (KLS ALLER-FLO) 50 MCG/ACT nasal spray Place into both nostrils daily.    [provider]  hydrochlorothiazide (HYDRODIURIL) 25 MG tablet Take 1 tablet (25 mg total) by mouth daily. 02/06/18   Kuneff, Renee A, DO  metFORMIN (GLUCOPHAGE) 500 MG tablet Take 1 tablet (500 mg total) by mouth 2 (two) times daily with a meal. 02/07/18   Kuneff, Renee A, DO  mometasone (ASMANEX, 60 METERED DOSES,) 220 MCG/INH inhaler Inhale 2 puffs into the lungs daily. 03/03/18   Tanda Rockers, MD  montelukast (SINGULAIR) 10  MG tablet Take 1 tablet (10 mg total) by mouth at bedtime. 02/06/18   Kuneff, Renee A, DO  rivaroxaban (XARELTO) 10 MG TABS tablet Take 1 tablet (10 mg total) by mouth daily. 03/03/18   Tanda Rockers, MD  UNABLE TO FIND Med Name: CPAP    [provider]    Family History Family History  Problem Relation Age of Onset  . Asthma Mother   . Arthritis Mother   . Allergies Brother   . Alcohol abuse Maternal Grandmother   . Cancer Maternal Grandmother     Social History Social History   Tobacco Use  . Smoking status: Never Smoker  . Smokeless tobacco: Never Used  Substance Use Topics  . Alcohol use: Yes     Alcohol/week: 3.0 standard drinks    Types: 3 Glasses of wine per week  . Drug use: No     Allergies   Lisinopril and Statins   Review of Systems Review of Systems  Constitutional: Negative for fever.  Respiratory: Negative for shortness of breath.   Cardiovascular: Negative for chest pain.  Gastrointestinal: Positive for abdominal pain. Negative for vomiting.  Genitourinary: Positive for flank pain.  Neurological: Negative for weakness.  All other systems reviewed and are negative.    Physical Exam Updated Vital Signs BP (!) 158/102 (BP Location: Right Arm)   Pulse (!) 59   Temp 98.1 F (36.7 C) (Oral)   Resp 19   Ht 1.829 m (6')   Wt (!) 158.8 kg   SpO2 97%   BMI 47.47 kg/m   Physical Exam CONSTITUTIONAL: Well developed/well nourished, uncomfortable appearing HEAD: Normocephalic/atraumatic EYES: EOMI/PERRL ENMT: Mucous membranes moist NECK: supple no meningeal signs SPINE/BACK:entire spine nontender CV: S1/S2 noted, no murmurs/rubs/gallops noted LUNGS: Lungs are clear to auscultation bilaterally, no apparent distress ABDOMEN: soft, nontender, no rebound or guarding, bowel sounds noted throughout abdomen, obese GU: mild right cva tenderness, no bruising, no erythema, no rash NEURO: Pt is awake/alert/appropriate, moves all extremitiesx4.  No facial droop.  No focal weakness noted in the lower extremities.  Equal power noted in bilateral lower extremities EXTREMITIES: pulses normal/equal, full ROM SKIN: warm, color normal, no erythema or rash noted to back or flank PSYCH: no abnormalities of mood noted, alert and oriented to situation   ED Treatments / Results  Labs (all labs ordered are listed, but only abnormal results are displayed) Labs Reviewed  URINALYSIS, ROUTINE W REFLEX MICROSCOPIC - Abnormal; Notable for the following components:      Result Value   Hgb urine dipstick SMALL (*)    All other components within normal limits  URINALYSIS, MICROSCOPIC  (REFLEX)    EKG None  Radiology No results found.  Procedures Procedures   Medications Ordered in ED Medications  oxyCODONE (Oxy IR/ROXICODONE) immediate release tablet 10 mg (10 mg Oral Given 04/20/18 0543)     Initial Impression / Assessment and Plan / ED Course  I have reviewed the triage vital signs and the nursing notes.  Pertinent labs results that were available during my care of the patient were reviewed by me and considered in my medical decision making (see chart for details).     5:41 AM Patient presents with flank pain that became abruptly worse tonight.    suspicion for ureteral colic.  Will treat pain, obtain urinalysis.  His exam is very limited due to significant obesity 6:42 AM Patient feels improved. On further discussion, he reports he did some movements and lifting earlier in the  week with his boat.  Has had began noticing right flank pain with movement.  No falls or trauma.  No abdominal pain.  No lower extremity weakness. Differential includes musculoskeletal pain versus ureteral colic.  I gave the patient the option of monitoring in the ER if any worsening to perform CT imaging.  After further discussion, patient declines further work-up will like to go home.  He declines further pain medications, will try Tylenol. If pain continues but does not worsen, he will follow-up with PCP and get outpatient CT imaging.  We discussed strict ER return precautions including new abdominal pain, new leg weakness, or rapidly worsening back pain with vomiting. Patient is well-appearing, in no acute distress.  Final Clinical Impressions(s) / ED Diagnoses   Final diagnoses:  Right flank pain    ED Discharge Orders    None       Ripley Fraise, MD 04/20/18 [phone]

## 2018-04-22 ENCOUNTER — Ambulatory Visit: Payer: Federal, State, Local not specified - PPO

## 2018-04-22 ENCOUNTER — Encounter: Payer: Self-pay | Admitting: Family Medicine

## 2018-04-22 ENCOUNTER — Ambulatory Visit: Payer: Federal, State, Local not specified - PPO | Admitting: Family Medicine

## 2018-04-22 VITALS — BP 140/89 | HR 69 | Temp 98.2°F | Resp 20 | Ht 72.0 in | Wt 351.0 lb

## 2018-04-22 DIAGNOSIS — S39012A Strain of muscle, fascia and tendon of lower back, initial encounter: Secondary | ICD-10-CM

## 2018-04-22 DIAGNOSIS — R109 Unspecified abdominal pain: Secondary | ICD-10-CM

## 2018-04-22 LAB — POC URINALSYSI DIPSTICK (AUTOMATED)
Bilirubin, UA: NEGATIVE
GLUCOSE UA: NEGATIVE
Ketones, UA: NEGATIVE
Leukocytes, UA: NEGATIVE
Nitrite, UA: NEGATIVE
Protein, UA: NEGATIVE
UROBILINOGEN UA: 0.2 U/dL
pH, UA: 5.5 (ref 5.0–8.0)

## 2018-04-22 MED ORDER — CYCLOBENZAPRINE HCL 10 MG PO TABS: 10.0000 mg | ORAL_TABLET | Freq: Two times a day (BID) | ORAL | 0 refills | Status: DC | PRN

## 2018-04-22 NOTE — Progress Notes (Signed)
Adrian [name] , 07/02/1961, 57 y.o., male MRN: [id number] Patient Care Team    Relationship Specialty Notifications Start End  Ma Hillock, DO PCP - General Family Medicine  02/06/18   Tanda Rockers, MD Consulting Physician Pulmonary Disease  02/07/18   Otelia Sergeant, OD Referring Physician   02/07/18   Gastroenterology, [facility]    02/19/18    Comment: was Dr. Cristina Gong    Chief Complaint  Patient presents with  . Follow-up    ED visit for right flank pain     Subjective: Pt presents for an OV with complaints of right flank pain of 10 days duration.  Worsening over last 4 days.  Associated symptoms include nausea, worse with muscle skeletal movement after rest and discomfort is worse at night causing sleep disturbance.  He states there has been mild improvement today.  He has been taking Tylenol for discomfort.  He was seen in the emergency room over the weekend and declined imaging studies to rule out kidney stone.  Urinalysis had trace hemoglobin in the emergency room.  Does not have a history of kidney stones.  Has a history of diverticulosis, but flares usually are left-sided for him if he is going to have one.  He denies fever, chills, vomiting, fatigue, changes in bowel habits, melena or dysuria.  He reports his urinary output is normal for him without any changes.  Endorses some heavier lifting earlier in the week prior to onset of discomfort.  Depression screen PHQ 2/9 02/06/2018  Decreased Interest 0  Down, Depressed, Hopeless 0  PHQ - 2 Score 0    Allergies  Allergen Reactions  . Lisinopril Cough  . Statins Other (See Comments)    Causes pain/ foot trouble   Social History   Tobacco Use  . Smoking status: Never Smoker  . Smokeless tobacco: Never Used  Substance Use Topics  . Alcohol use: Yes    Alcohol/week: 3.0 standard drinks    Types: 3 Glasses of wine per week   Past Medical History:  Diagnosis Date  . Adenomatous colon polyp    FS 02/2004; colo 2005  hyperplastic polyp; 2010 neg - butmucosal prolapse.   . Allergy   . Asthma   . Blood in stool   . Chicken pox   . Colon polyps   . Diabetes mellitus without complication ([place]) 27/7412  . Diverticulosis   . Frequent headaches   . Genital warts   . GERD (gastroesophageal reflux disease)   . Hemorrhoid   . Hyperlipidemia   . Hypertension   . Hypogonadism in male    labs x2: 11/14/2015 - 216 and 11/17/2015 193; declined med at that time.   . Insomnia   . OSA on CPAP 09/18/2016   HST 09/18/2016; ESS-4; AHI-76; O2 min- 61%  . Pulmonary embolism ([place]) 01/2017   provoked by long care ride, on xarelto, est Dr. Melvyn Novas  . Thyroid disease    subclinical hypothyroidism per records   Past Surgical History:  Procedure Laterality Date  . ANKLE SURGERY     Family History  Problem Relation Age of Onset  . Asthma Mother   . Arthritis Mother   . Allergies Brother   . Alcohol abuse Maternal Grandmother   . Cancer Maternal Grandmother    Allergies as of 04/22/2018      Reactions   Lisinopril Cough   Statins Other (See Comments)   Causes pain/ foot trouble      Medication List  Accurate as of 04/22/18 11:59 PM. Always use your most recent med list.          blood glucose meter kit and supplies Kit Dispense based on patient and insurance preference. Use up to 2 times daily as directed.   cyclobenzaprine 10 MG tablet Commonly known as:  FLEXERIL Take 1 tablet (10 mg total) by mouth 2 (two) times daily as needed for muscle spasms.   ezetimibe 10 MG tablet Commonly known as:  ZETIA Take 1 tablet (10 mg total) by mouth daily.   hydrochlorothiazide 25 MG tablet Commonly known as:  HYDRODIURIL Take 1 tablet (25 mg total) by mouth daily.   KLS ALLER-FLO 50 MCG/ACT nasal spray Generic drug:  fluticasone Place into both nostrils daily.   metFORMIN 500 MG tablet Commonly known as:  GLUCOPHAGE Take 1 tablet (500 mg total) by mouth 2 (two) times daily with a meal.   mometasone  220 MCG/INH inhaler Commonly known as:  ASMANEX Inhale 2 puffs into the lungs daily.   montelukast 10 MG tablet Commonly known as:  SINGULAIR Take 1 tablet (10 mg total) by mouth at bedtime.   rivaroxaban 10 MG Tabs tablet Commonly known as:  XARELTO Take 1 tablet (10 mg total) by mouth daily.   UNABLE TO FIND Med Name: CPAP       All past medical history, surgical history, allergies, family history, immunizations andmedications were updated in the EMR today and reviewed under the history and medication portions of their EMR.     ROS: Negative, with the exception of above mentioned in HPI   Objective:  BP 140/89 (BP Location: Left Arm, Patient Position: Sitting, Cuff Size: Large)   Pulse 69   Temp 98.2 F (36.8 C)   Resp 20   Ht 6' (1.829 m)   Wt (!) 351 lb (159.2 kg)   SpO2 97%   BMI 47.60 kg/m  Body mass index is 47.6 kg/m. Gen: Afebrile. No acute distress. Nontoxic in appearance, well developed, well nourished.  HENT: AT. [place].  MMM, no oral lesions.  Eyes:Pupils Equal Round Reactive to light, Extraocular movements intact,  Conjunctiva without redness, discharge or icterus. Neck/lymp/endocrine: Supple,no lymphadenopathy CV: RRR, no edema Chest: CTAB, no wheeze or crackles. Good air movement, normal resp effort.  Abd: Soft.  Obese. NTND. BS present.  No masses palpated. No rebound or guarding.  MSK: no CVA tenderness bilaterally Neuro:  Normal gait. PERLA. EOMi. Alert. Oriented x3  Psych: Normal affect, dress and demeanor. Normal speech. Normal thought content and judgment.  No exam data present No results found. No results found for this or any previous visit (from the past 24 hour(s)).  Assessment/Plan: Adrian [name] is a 57 y.o. male present for OV for  Flank pain/Back strain, initial encounter - Discomfort is reproduced with bilateral SB and rotation of back. Suspect MSK related. Can not r/o kidney stone of diverticulosis without labs and images. Since he is  improving, decided to collect labs first and treat as MSK pain. - imagining or abx will be ordered if labs indicate need, or pts condition worsens. He is agreeable with plan today.  - heat pad encouraged and instructed on proper use  - flexeril BID PRN (warned of sedative properties, start QHS first). - avoid lifting for 1-2 weeks.  - Comp Met (CMET) - CBC w/Diff - Urinalysis, Routine w reflex microscopic - Urine Culture - POCT Urinalysis Dipstick (Automated) - F/U dependent upon lab results and clinical outcome.  Reviewed expectations re: course of current medical issues.  Discussed self-management of symptoms.  Outlined signs and symptoms indicating need for more acute intervention.  Patient verbalized understanding and all questions were answered.  Patient received an After-Visit Summary.    Orders Placed This Encounter  Procedures  . Urine Culture  . MICROSCOPIC MESSAGE  . Comp Met (CMET)  . CBC w/Diff  . Urinalysis, Routine w reflex microscopic  . POCT Urinalysis Dipstick (Automated)     Note is dictated utilizing voice recognition software. Although note has been proof read prior to signing, occasional typographical errors still can be missed. If any questions arise, please do not hesitate to call for verification.   electronically signed by:  Howard Pouch, DO  [place]

## 2018-04-22 NOTE — Patient Instructions (Signed)
I believe this may be more muscle related. I will send your urine for culture and blood work to be certain.  Start flexeril at least nightly before bed, and if not too sedated from med--> you can take every 12 hours.   Try heating pad as we discussed. We will try to hold off on CT unless symptoms are not resolved or labs indicate need.     Muscle Strain A muscle strain (pulled muscle) happens when a muscle is stretched beyond normal length. It happens when a sudden, violent force stretches your muscle too far. Usually, a few of the fibers in your muscle are torn. Muscle strain is common in athletes. Recovery usually takes 1-2 weeks. Complete healing takes 5-6 weeks. Follow these instructions at home:  Follow the PRICE method of treatment to help your injury get better. Do this the first 2-3 days after the injury: ? Protect. Protect the muscle to keep it from getting injured again. ? Rest. Limit your activity and rest the injured body part. ? Ice. Put ice in a plastic bag. Place a towel between your skin and the bag. Then, apply the ice and leave it on from 15-20 minutes each hour. After the third day, switch to moist heat packs. ? Compression. Use a splint or elastic bandage on the injured area for comfort. Do not put it on too tightly. ? Elevate. Keep the injured body part above the level of your heart.  Only take medicine as told by your doctor.  Warm up before doing exercise to prevent future muscle strains. Contact a doctor if:  You have more pain or puffiness (swelling) in the injured area.  You feel numbness, tingling, or notice a loss of strength in the injured area. This information is not intended to replace advice given to you by your health care provider. Make sure you discuss any questions you have with your health care provider. Document Released: 04/17/2008 Document Revised: 12/15/2015 Document Reviewed: 02/05/2013 Elsevier Interactive Patient Education  2017 [employer].

## 2018-04-23 ENCOUNTER — Encounter: Payer: Self-pay | Admitting: Family Medicine

## 2018-04-23 DIAGNOSIS — Z23 Encounter for immunization: Secondary | ICD-10-CM | POA: Diagnosis not present

## 2018-04-23 DIAGNOSIS — L821 Other seborrheic keratosis: Secondary | ICD-10-CM | POA: Diagnosis not present

## 2018-04-23 DIAGNOSIS — D225 Melanocytic nevi of trunk: Secondary | ICD-10-CM | POA: Diagnosis not present

## 2018-04-23 DIAGNOSIS — L814 Other melanin hyperpigmentation: Secondary | ICD-10-CM | POA: Diagnosis not present

## 2018-04-23 LAB — URINALYSIS, ROUTINE W REFLEX MICROSCOPIC
BILIRUBIN URINE: NEGATIVE
Bacteria, UA: NONE SEEN /HPF
Glucose, UA: NEGATIVE
Hyaline Cast: NONE SEEN /LPF
KETONES UR: NEGATIVE
LEUKOCYTES UA: NEGATIVE
NITRITE: NEGATIVE
PROTEIN: NEGATIVE
RBC / HPF: NONE SEEN /HPF (ref 0–2)
Specific Gravity, Urine: 1.022 (ref 1.001–1.03)
WBC UA: NONE SEEN /HPF (ref 0–5)
pH: 5.5 (ref 5.0–8.0)

## 2018-04-23 LAB — COMPREHENSIVE METABOLIC PANEL
AG Ratio: 1.4 (calc) (ref 1.0–2.5)
ALBUMIN MSPROF: 4.1 g/dL (ref 3.6–5.1)
ALT: 37 U/L (ref 9–46)
AST: 24 U/L (ref 10–35)
Alkaline phosphatase (APISO): 63 U/L (ref 40–115)
BUN: 18 mg/dL (ref 7–25)
CO2: 30 mmol/L (ref 20–32)
CREATININE: 0.98 mg/dL (ref 0.70–1.33)
Calcium: 9.5 mg/dL (ref 8.6–10.3)
Chloride: 104 mmol/L (ref 98–110)
GLUCOSE: 91 mg/dL (ref 65–99)
Globulin: 3 g/dL (calc) (ref 1.9–3.7)
POTASSIUM: 3.7 mmol/L (ref 3.5–5.3)
SODIUM: 140 mmol/L (ref 135–146)
TOTAL PROTEIN: 7.1 g/dL (ref 6.1–8.1)
Total Bilirubin: 0.3 mg/dL (ref 0.2–1.2)

## 2018-04-23 LAB — CBC WITH DIFFERENTIAL/PLATELET
BASOS ABS: 42 {cells}/uL (ref 0–200)
BASOS PCT: 0.4 %
Eosinophils Absolute: 0 cells/uL — ABNORMAL LOW (ref 15–500)
Eosinophils Relative: 0 %
HEMATOCRIT: 43.4 % (ref 38.5–50.0)
HEMOGLOBIN: 14.7 g/dL (ref 13.2–17.1)
Lymphs Abs: 1955 cells/uL (ref 850–3900)
MCH: 28.3 pg (ref 27.0–33.0)
MCHC: 33.9 g/dL (ref 32.0–36.0)
MCV: 83.5 fL (ref 80.0–100.0)
MPV: 10.7 fL (ref 7.5–12.5)
Monocytes Relative: 8.1 %
Neutro Abs: 7561 cells/uL (ref 1500–7800)
Neutrophils Relative %: 72.7 %
Platelets: 290 10*3/uL (ref 140–400)
RBC: 5.2 10*6/uL (ref 4.20–5.80)
RDW: 13.6 % (ref 11.0–15.0)
Total Lymphocyte: 18.8 %
WBC: 10.4 10*3/uL (ref 3.8–10.8)
WBCMIX: 842 {cells}/uL (ref 200–950)

## 2018-04-23 LAB — URINE CULTURE
MICRO NUMBER:: 91178073
RESULT: NO GROWTH
SPECIMEN QUALITY: ADEQUATE

## 2018-04-29 ENCOUNTER — Ambulatory Visit: Payer: Federal, State, Local not specified - PPO

## 2018-05-12 ENCOUNTER — Other Ambulatory Visit: Payer: Self-pay | Admitting: Family Medicine

## 2018-05-12 ENCOUNTER — Ambulatory Visit: Payer: Federal, State, Local not specified - PPO | Admitting: Family Medicine

## 2018-05-12 ENCOUNTER — Encounter: Payer: Self-pay | Admitting: Family Medicine

## 2018-05-12 ENCOUNTER — Ambulatory Visit (HOSPITAL_BASED_OUTPATIENT_CLINIC_OR_DEPARTMENT_OTHER)
Admission: RE | Admit: 2018-05-12 | Discharge: 2018-05-12 | Disposition: A | Discharge status: Home / Self Care | Payer: Federal, State, Local not specified - PPO | Source: Ambulatory Visit | Attending: Family Medicine | Admitting: Family Medicine

## 2018-05-12 VITALS — BP 138/82 | HR 65 | Resp 16 | Ht 72.0 in | Wt 348.0 lb

## 2018-05-12 DIAGNOSIS — E119 Type 2 diabetes mellitus without complications: Secondary | ICD-10-CM | POA: Diagnosis not present

## 2018-05-12 DIAGNOSIS — I10 Essential (primary) hypertension: Secondary | ICD-10-CM

## 2018-05-12 DIAGNOSIS — M546 Pain in thoracic spine: Secondary | ICD-10-CM | POA: Diagnosis not present

## 2018-05-12 DIAGNOSIS — Z23 Encounter for immunization: Secondary | ICD-10-CM

## 2018-05-12 DIAGNOSIS — R140 Abdominal distension (gaseous): Secondary | ICD-10-CM

## 2018-05-12 DIAGNOSIS — E782 Mixed hyperlipidemia: Secondary | ICD-10-CM

## 2018-05-12 LAB — MICROALBUMIN / CREATININE URINE RATIO
Creatinine,U: 161.8 mg/dL
MICROALB UR: 0.9 mg/dL (ref 0.0–1.9)
Microalb Creat Ratio: 0.5 mg/g (ref 0.0–30.0)

## 2018-05-12 LAB — COMPREHENSIVE METABOLIC PANEL
ALT: 36 U/L (ref 0–53)
AST: 24 U/L (ref 0–37)
Albumin: 4.2 g/dL (ref 3.5–5.2)
Alkaline Phosphatase: 66 U/L (ref 39–117)
BUN: 19 mg/dL (ref 6–23)
CALCIUM: 9.6 mg/dL (ref 8.4–10.5)
CHLORIDE: 101 meq/L (ref 96–112)
CO2: 31 meq/L (ref 19–32)
CREATININE: 0.97 mg/dL (ref 0.40–1.50)
GFR: 84.9 mL/min (ref >60.00)
Glucose, Bld: 113 mg/dL — ABNORMAL HIGH (ref 70–99)
Potassium: 4.7 mEq/L (ref 3.5–5.1)
SODIUM: 139 meq/L (ref 135–145)
Total Bilirubin: 0.5 mg/dL (ref 0.2–1.2)
Total Protein: 7.4 g/dL (ref 6.0–8.3)

## 2018-05-12 LAB — LIPID PANEL
CHOL/HDL RATIO: 5
Cholesterol: 195 mg/dL (ref 0–200)
HDL: 39.4 mg/dL (ref >39.00)
LDL Cholesterol: 135 mg/dL — ABNORMAL HIGH (ref 0–99)
NonHDL: 155.7
TRIGLYCERIDES: 105 mg/dL (ref 0.0–149.0)
VLDL: 21 mg/dL (ref 0.0–40.0)

## 2018-05-12 MED ORDER — LOSARTAN POTASSIUM 25 MG PO TABS: 25.0000 mg | ORAL_TABLET | Freq: Every day | ORAL | 1 refills | Status: DC

## 2018-05-12 MED ORDER — METFORMIN HCL 500 MG PO TABS: 500.0000 mg | ORAL_TABLET | Freq: Two times a day (BID) | ORAL | 1 refills | Status: DC

## 2018-05-12 MED ORDER — HYDROCHLOROTHIAZIDE 25 MG PO TABS: 25.0000 mg | ORAL_TABLET | Freq: Every day | ORAL | 1 refills | Status: DC

## 2018-05-12 NOTE — Progress Notes (Signed)
Patient ID: Adrian [name], male  DOB: 07/12/1961, 57 y.o.   MRN: [id number] Patient Care Team    Relationship Specialty Notifications Start End  Ma Hillock, DO PCP - General Family Medicine  02/06/18   Tanda Rockers, MD Consulting Physician Pulmonary Disease  02/07/18   Otelia Sergeant, OD Referring Physician   02/07/18   Gastroenterology, [facility]    02/19/18    Comment: was Dr. Cristina Gong    Chief Complaint  Patient presents with  . Hypertension    Diabetes follow up    Subjective:  Adrian [name] is a 57 y.o.  male present for [facility].  Essential hypertension/hyperlipidemia/morbid obesity/CKD3 Pt reports compliance with HCTZ 25 mg daily. Blood pressures ranges at home not routinely checked.  Patient denies chest pain, shortness of breath, dizziness or lower extremity edema.  Pt is not taking a daily baby ASA. Pt is not prescribed statin 2/2 intolerance x2. Started zeita 3 months ago.  BMP: 04/22/2018 WNL CBC: 04/22/2018 WNL Lipid: 02/06/2018 Tch 201, HDL 35, LDL 140, tg 127 TSH: 01/2018 WNL Diet: low Salt Exercise: Routine exercise RF: Hypertension, hyperlipidemia, obesity, diabetes.   Type 2 diabetes mellitus without complication, without long-term current use of insulin ([place]) Started on metformin after last visit for an A1c of 6.6. Tolerating new med. Sugars 92-162. Patient denies dizziness, hyperglycemic or hypoglycemic events. Patient denies numbness, tingling in the extremities or nonhealing wounds of feet.  PNA series: Pneumococcal 23 02/02/2017-->prevnar completed today Flu shot: completed today (recommneded yearly) Microalbumin: on ARB Foot exam: 05/12/18 UTD Eye exam:10/17/2017, Dr. Oswaldo Conroy,  [place] [facility] A1c: 6.6 >>5.8  Flank pain:  Her reports his "flank" pain is still present. If the flexeril is helping it is only minimally. He is unable to lay on his right side without pain. He denies fever, chills, nausea, vomit bowel/urinary  Changes. He does endorse bloating.    Prior note 04/22/2018: Pt presents for an OV with complaints of right flank pain of 10 days duration.  Worsening over last 4 days.  Associated symptoms include nausea, worse with muscle skeletal movement after rest and discomfort is worse at night causing sleep disturbance.  He states there has been mild improvement today.  He has been taking Tylenol for discomfort.  He was seen in the emergency room over the weekend and declined imaging studies to rule out kidney stone.  Urinalysis had trace hemoglobin in the emergency room.  Does not have a history of kidney stones.  Has a history of diverticulosis, but flares usually are left-sided for him if he is going to have one.  He denies fever, chills, vomiting, fatigue, changes in bowel habits, melena or dysuria.  He reports his urinary output is normal for him without any changes.  Endorses some heavier lifting earlier in the week prior to onset of discomfort.  Depression screen [facility] 2/9 04/23/2018 02/06/2018  Decreased Interest 0 0  Down, Depressed, Hopeless 0 0  PHQ - 2 Score 0 0   No flowsheet data found.        Fall Risk  04/23/2018 02/06/2018  Falls in the past year? No No     Immunization History  Administered Date(s) Administered  . Influenza Whole 04/22/2017  . Influenza,inj,Quad PF,6+ Mos 05/20/2015, 05/12/2018  . Influenza,inj,quad, With Preservative 05/20/2015  . Pneumococcal Conjugate-13 05/12/2018  . Pneumococcal Polysaccharide-23 02/02/2017    No exam data present  Past Medical History:  Diagnosis Date  . Adenomatous colon polyp  FS 02/2004; colo 2005 hyperplastic polyp; 2010 neg - butmucosal prolapse.   . Allergy   . Asthma   . Blood in stool   . Chicken pox   . Colon polyps   . Diabetes mellitus without complication ([place]) 16/1096  . Diverticulosis   . Frequent headaches   . Genital warts   . GERD (gastroesophageal reflux disease)   . Hemorrhoid   . Hyperlipidemia   . Hypertension   . Hypogonadism in male     labs x2: 11/14/2015 - 216 and 11/17/2015 193; declined med at that time.   . Insomnia   . OSA on CPAP 09/18/2016   HST 09/18/2016; ESS-4; AHI-76; O2 min- 61%  . Pulmonary embolism ([place]) 01/2017   provoked by long care ride, on xarelto, est Dr. Melvyn Novas  . Thyroid disease    subclinical hypothyroidism per records   Allergies  Allergen Reactions  . Lisinopril Cough  . Statins Other (See Comments)    Causes pain/ foot trouble   Past Surgical History:  Procedure Laterality Date  . ANKLE SURGERY     Family History  Problem Relation Age of Onset  . Asthma Mother   . Arthritis Mother   . Allergies Brother   . Alcohol abuse Maternal Grandmother   . Cancer Maternal Grandmother    Social History   Socioeconomic History  . Marital status: Married    Spouse name: Not on file  . Number of children: Not on file  . Years of education: Not on file  . Highest education level: Not on file  Occupational History  . Not on file  Social Needs  . Financial resource strain: Not on file  . Food insecurity:    Worry: Not on file    Inability: Not on file  . Transportation needs:    Medical: Not on file    Non-medical: Not on file  Tobacco Use  . Smoking status: Never Smoker  . Smokeless tobacco: Never Used  Substance and Sexual Activity  . Alcohol use: Yes    Alcohol/week: 3.0 standard drinks    Types: 3 Glasses of wine per week  . Drug use: No  . Sexual activity: Yes    Partners: Female  Lifestyle  . Physical activity:    Days per week: Not on file    Minutes per session: Not on file  . Stress: Not on file  Relationships  . Social connections:    Talks on phone: Not on file    Gets together: Not on file    Attends religious service: Not on file    Active member of club or organization: Not on file    Attends meetings of clubs or organizations: Not on file    Relationship status: Not on file  . Intimate partner violence:    Fear of current or ex partner: Not on file     Emotionally abused: Not on file    Physically abused: Not on file    Forced sexual activity: Not on file  Other Topics Concern  . Not on file  Social History Narrative   Marital status/children/pets: Married.   Education/employment: Associates degree, employed as a Armed forces operational officer:      -Wears a bicycle helmet riding a bike: No     -smoke alarm in the home:Yes     - wears seatbelt: Yes     - Feels safe in their relationships: Yes   Allergies as of 05/12/2018  Reactions   Lisinopril Cough   Statins Other (See Comments)   Causes pain/ foot trouble      Medication List        Accurate as of 05/12/18 12:24 PM. Always use your most recent med list.          blood glucose meter kit and supplies Kit Dispense based on patient and insurance preference. Use up to 2 times daily as directed.   cyclobenzaprine 10 MG tablet Commonly known as:  FLEXERIL Take 1 tablet (10 mg total) by mouth 2 (two) times daily as needed for muscle spasms.   ezetimibe 10 MG tablet Commonly known as:  ZETIA Take 1 tablet (10 mg total) by mouth daily.   hydrochlorothiazide 25 MG tablet Commonly known as:  HYDRODIURIL Take 1 tablet (25 mg total) by mouth daily.   KLS ALLER-FLO 50 MCG/ACT nasal spray Generic drug:  fluticasone Place into both nostrils daily.   losartan 25 MG tablet Commonly known as:  COZAAR Take 1 tablet (25 mg total) by mouth daily.   metFORMIN 500 MG tablet Commonly known as:  GLUCOPHAGE Take 1 tablet (500 mg total) by mouth 2 (two) times daily with a meal.   mometasone 220 MCG/INH inhaler Commonly known as:  ASMANEX Inhale 2 puffs into the lungs daily.   montelukast 10 MG tablet Commonly known as:  SINGULAIR Take 1 tablet (10 mg total) by mouth at bedtime.   rivaroxaban 10 MG Tabs tablet Commonly known as:  XARELTO Take 1 tablet (10 mg total) by mouth daily.   UNABLE TO FIND Med Name: CPAP       All past medical history, surgical history,  allergies, family history, immunizations andmedications were updated in the EMR today and reviewed under the history and medication portions of their EMR.     No results found.   ROS: 14 pt review of systems performed and negative (unless mentioned in an HPI)  Objective: BP 138/82   Pulse 65   Resp 16   Ht 6' (1.829 m)   Wt (!) 348 lb (157.9 kg)   SpO2 94%   BMI 47.20 kg/m  Gen: Afebrile. No acute distress. Nontoxic in presentation. Overweight caucasian male.  HENT: AT. [place]. MMM.  Eyes:Pupils Equal Round Reactive to light, Extraocular movements intact,  Conjunctiva without redness, discharge or icterus. Neck/lymp/endocrine: Supple,no lymphadenopathy, no thyromegaly CV: RRR no murmur, no edema, +2/4 P posterior tibialis pulses Chest: CTAB, no wheeze or crackles Abd: Soft. obese. NTND. BS present. no Masses palpated.  MSK: no erythema, no soft tissue swelling, no TTP thoracic spine. NV intact distally.  Skin: no rashes, purpura or petechiae.  Neuro:  Normal gait. PERLA. EOMi. Alert. Oriented x3  Psych: Normal affect, dress and demeanor. Normal speech. Normal thought content and judgment.  Diabetic Foot Exam - Simple   Simple Foot Form Diabetic Foot exam was performed with the following findings:  Yes 05/12/2018 12:19 PM  Visual Inspection No deformities, no ulcerations, no other skin breakdown bilaterally:  Yes Sensation Testing Intact to touch and monofilament testing bilaterally:  Yes Pulse Check Posterior Tibialis and Dorsalis pulse intact bilaterally:  Yes Comments      Assessment/plan: Adrian [name] is a 57 y.o. male present for tablets care Essential hypertension/hyperlipidemia/morbid obesity - remains borderline. Added losartan to regimen today.   Continue HCTZ 25 mg daily.  Advised him to increase his exercise greater than 150 minutes a week.   - tolerating Zeita.  - lipid and cmp collected  today.  -Follow-up 4 mos.   Type 2 diabetes mellitus without  complication, without long-term current use of insulin ([place]) - did great on metformin, refilled today. Continue diet and exercise modifications  PNA series: Pneumococcal 23 02/02/2017-->prevnar completed today Flu shot: completed today (recommneded yearly) Microalbumin: on ARB Foot exam: 05/12/18 UTD Eye exam:10/17/2017, Dr. Oswaldo Conroy,  [place] [facility] A1c: 6.6 >>5.8 today F/u 4 months  Thoracic pain/bloating:  - mostly at night. Suspect MSK given his complaints of worsening when layin gon area. Discussed options today and pt agreed to xray of thoracic spine.  - Continue with pillow modifications.  - would not seem his bloating is related- may need to consider PPI or GI referral if remains. Colonoscopy UTD.  Return in about 4 months (around 09/12/2018) for [facility].  > 25 minutes spent with patient, >50% of time spent face to face counseling and coordinating care.     Note is dictated utilizing voice recognition software. Although note has been proof read prior to signing, occasional typographical errors still can be missed. If any questions arise, please do not hesitate to call for verification.  Electronically signed by: Howard Pouch, DO [place]

## 2018-05-12 NOTE — Patient Instructions (Signed)
Follow up in 4 months.  Continue meds added losartan 25 mg a day for BP.   Please have xray completed at [facility]. We will call you with results and plan.    Please help [place] help you:  We are honored you have chosen [facility] for your Primary Care home. Below you will find basic instructions that you may need to access in the future. Please help [place] help you by reading the instructions, which cover many of the frequent questions we experience.   Prescription refills and request:  -In order to allow more efficient response time, please call your pharmacy for all refills. They will forward the request electronically to [place]. This allows for the quickest possible response. Request left on a nurse line can take longer to refill, since these are checked as time allows between office patients and other phone calls.  - refill request can take up to 3-5 working days to complete.  - If request is sent electronically and request is appropiate, it is usually completed in 1-2 business days.  - all patients will need to be seen routinely for all chronic medical conditions requiring prescription medications (see follow-up below). If you are overdue for follow up on your condition, you will be asked to make an appointment and we will call in enough medication to cover you until your appointment (up to 30 days).  - all controlled substances will require a face to face visit to request/refill.  - if you desire your prescriptions to go through a new pharmacy, and have an active script at original pharmacy, you will need to call your pharmacy and have scripts transferred to new pharmacy. This is completed between the pharmacy locations and not by your provider.    Results: If any images or labs were ordered, it can take up to 1 week to get results depending on the test ordered and the lab/facility running and resulting the test. - Normal or stable results, which do not need further discussion, may be  released to your mychart immediately with attached note to you. A call may not be generated for normal results. Please make certain to sign up for mychart. If you have questions on how to activate your mychart you can call the front office.  - If your results need further discussion, our office will attempt to contact you via phone, and if unable to reach you after 2 attempts, we will release your abnormal result to your mychart with instructions.  - All results will be automatically released in mychart after 1 week.  - Your provider will provide you with explanation and instruction on all relevant material in your results. Please keep in mind, results and labs may appear confusing or abnormal to the untrained eye, but it does not mean they are actually abnormal for you personally. If you have any questions about your results that are not covered, or you desire more detailed explanation than what was provided, you should make an appointment with your provider to do so.   Our office handles many outgoing and incoming calls daily. If we have not contacted you within 1 week about your results, please check your mychart to see if there is a message first and if not, then contact our office.  In helping with this matter, you help decrease call volume, and therefore allow [place] to be able to respond to patients needs more efficiently.   Acute office visits (sick visit):  An acute visit is intended  for a new problem and are scheduled in shorter time slots to allow schedule openings for patients with new problems. This is the appropriate visit to discuss a new problem. Problems will not be addressed by phone call or Echart message. Appointment is needed if requesting treatment. In order to provide you with excellent quality medical care with proper time for you to explain your problem, have an exam and receive treatment with instructions, these appointments should be limited to one new problem per visit. If you  experience a new problem, in which you desire to be addressed, please make an acute office visit, we save openings on the schedule to accommodate you. Please do not save your new problem for any other type of visit, let us take care of it properly and quickly for you.   Follow up visits:  Depending on your condition(s) your provider will need to see you routinely in order to provide you with quality care and prescribe medication(s). Most chronic conditions (Example: hypertension, Diabetes, depression/anxiety... etc), require visits a couple times a year. Your provider will instruct you on proper follow up for your personal medical conditions and history. Please make certain to make follow up appointments for your condition as instructed. Failing to do so could result in lapse in your medication treatment/refills. If you request a refill, and are overdue to be seen on a condition, we will always provide you with a 30 day script (once) to allow you time to schedule.    Medicare wellness (well visit): - we have a wonderful Nurse Maudie Mercury), that will meet with you and provide you will yearly medicare wellness visits. These visits should occur yearly (can not be scheduled less than 1 calendar year apart) and cover preventive health, immunizations, advance directives and screenings you are entitled to yearly through your medicare benefits. Do not miss out on your entitled benefits, this is when medicare will pay for these benefits to be ordered for you.  These are strongly encouraged by your provider and is the appropriate type of visit to make certain you are up to date with all preventive health benefits. If you have not had your medicare wellness exam in the last 12 months, please make certain to schedule one by calling the office and schedule your medicare wellness with Maudie Mercury as soon as possible.   Yearly physical (well visit):  - Adults are recommended to be seen yearly for physicals. Check with your insurance  and date of your last physical, most insurances require one calendar year between physicals. Physicals include all preventive health topics, screenings, medical exam and labs that are appropriate for gender/age and history. You may have fasting labs needed at this visit. This is a well visit (not a sick visit), new problems should not be covered during this visit (see acute visit).  - Pediatric patients are seen more frequently when they are younger. Your provider will advise you on well child visit timing that is appropriate for your their age. - This is not a medicare wellness visit. Medicare wellness exams do not have an exam portion to the visit. Some medicare companies allow for a physical, some do not allow a yearly physical. If your medicare allows a yearly physical you can schedule the medicare wellness with our nurse Maudie Mercury and have your physical with your provider after, on the same day. Please check with insurance for your full benefits.   Late Policy/No Shows:  - all new patients should arrive 15-30 minutes earlier than appointment  to allow [place] time  to  obtain all personal demographics,  insurance information and for you to complete office paperwork. - All established patients should arrive 10-15 minutes earlier than appointment time to update all information and be checked in .  - In our best efforts to run on time, if you are late for your appointment you will be asked to either reschedule or if able, we will work you back into the schedule. There will be a wait time to work you back in the schedule,  depending on availability.  - If you are unable to make it to your appointment as scheduled, please call 24 hours ahead of time to allow [place] to fill the time slot with someone else who needs to be seen. If you do not cancel your appointment ahead of time, you may be charged a no show fee.

## 2018-05-13 ENCOUNTER — Telehealth: Payer: Self-pay | Admitting: Family Medicine

## 2018-05-13 MED ORDER — GABAPENTIN 300 MG PO CAPS: 300.0000 mg | ORAL_CAPSULE | Freq: Every day | ORAL | 0 refills | Status: DC

## 2018-05-13 MED ORDER — OMEPRAZOLE 40 MG PO CPDR: 40.0000 mg | DELAYED_RELEASE_CAPSULE | Freq: Every day | ORAL | 3 refills | Status: DC

## 2018-05-13 NOTE — Telephone Encounter (Signed)
Please inform patient the following information: His liver/kidney function is normal.  His cholesterol improved with the zeita, but not as much as I hoped. Continue med and diet and exercise as well.  His back xray did show evidence of back or rib acute injury, but did have evidence of degenerative (or arthritic changes present).  His back discomfort could be coming from degenerative changes. Unfortunately he is unable to use antiinflammatories secondary to being on xarelto. He can take a medication called gabapentin before bed that can give him relief if from back/nerve. This is not a controlled substance. It can cause sedation.  I also would like him to start on PPI, and I have called this in for him for the heartburn and abd bloating. I am not certain if these are connected at this time.  followup in 4 weeks- for this issue, sooner if worsening.

## 2018-05-13 NOTE — Telephone Encounter (Signed)
Patient notified and verbalized understanding. Appointment scheduled 06/10/18.

## 2018-05-26 ENCOUNTER — Other Ambulatory Visit: Payer: Self-pay | Admitting: Family Medicine

## 2018-05-26 DIAGNOSIS — S39012A Strain of muscle, fascia and tendon of lower back, initial encounter: Secondary | ICD-10-CM

## 2018-05-26 NOTE — Telephone Encounter (Signed)
Copied from [place] [id number]. Topic: Quick Communication - Rx Refill/Question >> May 26, 2018 10:48 AM Reyne Dumas L wrote: Medication:  ezetimibe (ZETIA) 10 MG tablet - Pt states this medication has been on back order at his pharmacy.  Pt is completely out and needs to know what to do.    cyclobenzaprine (FLEXERIL) 10 MG tablet - needs to be refilled.  Pt states that he has two pills left.  Pt uses: [id number] [place], [place] [phone] (Phone) [phone] (Fax)  Pt can be reached at [phone]

## 2018-05-27 ENCOUNTER — Other Ambulatory Visit: Payer: Self-pay | Admitting: Family Medicine

## 2018-05-27 MED ORDER — CYCLOBENZAPRINE HCL 10 MG PO TABS: 10.0000 mg | ORAL_TABLET | Freq: Two times a day (BID) | ORAL | 0 refills | Status: DC | PRN

## 2018-05-27 MED ORDER — EZETIMIBE 10 MG PO TABS: 10.0000 mg | ORAL_TABLET | Freq: Every day | ORAL | 3 refills | Status: DC

## 2018-05-27 NOTE — Telephone Encounter (Signed)
Pt stated the pharmacy his Zetia was sent to and the med is not in stock. Resnt to [place] [address]

## 2018-05-27 NOTE — Telephone Encounter (Signed)
Requested Prescriptions  Pending Prescriptions Disp Refills  . cyclobenzaprine (FLEXERIL) 10 MG tablet 30 tablet 0    Sig: Take 1 tablet (10 mg total) by mouth 2 (two) times daily as needed for muscle spasms.     Not Delegated - Analgesics:  Muscle Relaxants Failed - 05/26/2018 11:04 AM      Failed - This refill cannot be delegated      Passed - Valid encounter within last 6 months    Recent Outpatient Visits          2 weeks ago Type 2 diabetes mellitus without complication, without long-term current use of insulin ([place])   [place], [place], DO   1 month ago Back strain, initial encounter   [place], [place], DO   3 months ago Essential hypertension   [place], Reinaldo Raddle, DO      Future Appointments            In 2 weeks Raoul Pitch, Reinaldo Raddle, DO [place], [place]   In 3 months Tanda Rockers, MD [facility]   In 3 months Raoul Pitch, Reinaldo Raddle, DO [facility], [facility]         . ezetimibe (ZETIA) 10 MG tablet 90 tablet 3    Sig: Take 1 tablet (10 mg total) by mouth daily.     Cardiovascular:  Antilipid - Sterol Transport Inhibitors Failed - 05/26/2018 11:04 AM      Failed - LDL in normal range and within 360 days    LDL Cholesterol  Date Value Ref Range Status  05/12/2018 135 (H) 0 - 99 mg/dL Final         Passed - Total Cholesterol in normal range and within 360 days    Cholesterol  Date Value Ref Range Status  05/12/2018 195 0 - 200 mg/dL Final    Comment:    ATP III Classification       Desirable:  < 200 mg/dL               Borderline High:  200 - 239 mg/dL          High:  > = 240 mg/dL         Passed - HDL in normal range and within 360 days    HDL  Date Value Ref Range Status  05/12/2018 39.40 >39.00 mg/dL Final         Passed - Triglycerides in normal range and within 360 days    Triglycerides  Date Value Ref Range Status   05/12/2018 105.0 0.0 - 149.0 mg/dL Final    Comment:    Normal:  <150 mg/dLBorderline High:  150 - 199 mg/dL         Passed - Valid encounter within last 12 months    Recent Outpatient Visits          2 weeks ago Type 2 diabetes mellitus without complication, without long-term current use of insulin ([place])   [place], Renee A, DO   1 month ago Back strain, initial encounter   [place], [place], Renee A, DO   3 months ago Essential hypertension   [place], Reinaldo Raddle, DO      Future Appointments  In 2 weeks [place], Reinaldo Raddle, DO [place], [place]   In 3 months Melvyn Novas, Christena Deem, MD [facility]   In 3 months Raoul Pitch, Reinaldo Raddle, DO [place], [facility]

## 2018-05-27 NOTE — Telephone Encounter (Signed)
Requested medication (s) are due for refill today: yes  Requested medication (s) are on the active medication list: yes  Last refill: 04/22/18  Future visit scheduled: yes  Notes to clinic:  undelegated    Requested Prescriptions  Pending Prescriptions Disp Refills   cyclobenzaprine (FLEXERIL) 10 MG tablet 30 tablet 0    Sig: Take 1 tablet (10 mg total) by mouth 2 (two) times daily as needed for muscle spasms.     Not Delegated - Analgesics:  Muscle Relaxants Failed - 05/26/2018 11:04 AM      Failed - This refill cannot be delegated      Passed - Valid encounter within last 6 months    Recent Outpatient Visits          2 weeks ago Type 2 diabetes mellitus without complication, without long-term current use of insulin ([place])   [place], [place], DO   1 month ago Back strain, initial encounter   [place], [place], DO   3 months ago Essential hypertension   [place], Reinaldo Raddle, DO      Future Appointments            In 2 weeks Raoul Pitch, Reinaldo Raddle, DO [place], [place]   In 3 months Melvyn Novas, Christena Deem, MD [facility]   In 3 months Raoul Pitch, Reinaldo Raddle, DO [place], [facility]         Signed Prescriptions Disp Refills   ezetimibe (ZETIA) 10 MG tablet 90 tablet 3    Sig: Take 1 tablet (10 mg total) by mouth daily.     Cardiovascular:  Antilipid - Sterol Transport Inhibitors Failed - 05/26/2018 11:04 AM      Failed - LDL in normal range and within 360 days    LDL Cholesterol  Date Value Ref Range Status  05/12/2018 135 (H) 0 - 99 mg/dL Final         Passed - Total Cholesterol in normal range and within 360 days    Cholesterol  Date Value Ref Range Status  05/12/2018 195 0 - 200 mg/dL Final    Comment:    ATP III Classification       Desirable:  < 200 mg/dL               Borderline High:  200 - 239 mg/dL          High:  > = 240 mg/dL          Passed - HDL in normal range and within 360 days    HDL  Date Value Ref Range Status  05/12/2018 39.40 >39.00 mg/dL Final         Passed - Triglycerides in normal range and within 360 days    Triglycerides  Date Value Ref Range Status  05/12/2018 105.0 0.0 - 149.0 mg/dL Final    Comment:    Normal:  <150 mg/dLBorderline High:  150 - 199 mg/dL         Passed - Valid encounter within last 12 months    Recent Outpatient Visits          2 weeks ago Type 2 diabetes mellitus without complication, without long-term current use of insulin ([place])   [place], Renee A, DO   1 month ago Back strain, initial Production manager  [facility], [place], DO   3 months ago Essential hypertension   [place], Reinaldo Raddle, DO      Future Appointments            In 2 weeks Raoul Pitch, Reinaldo Raddle, DO [facility] [place], [place]   In 3 months Melvyn Novas, Christena Deem, MD [facility]   In 3 months Raoul Pitch, Reinaldo Raddle, DO [place], [facility]

## 2018-05-27 NOTE — Telephone Encounter (Signed)
Copied from [place] [id number]. Topic: Quick Communication - Rx Refill/Question >> May 27, 2018  3:42 PM Reyne Dumas L wrote: Medication:  ezetimibe (ZETIA) 10 MG tablet  Pt states that his normal pharmacy is out of this medication so he wants to know if it can be sent into another pharmacy.  Pt states that it is in stock at [place], [place] - [place] [address] [phone] (Phone) [phone] (Fax).  Pt wants to know if a 90 day supply can be sent in for him there.  Pt can be reached at [phone]

## 2018-06-10 ENCOUNTER — Encounter: Payer: Self-pay | Admitting: Family Medicine

## 2018-06-10 ENCOUNTER — Ambulatory Visit: Payer: Federal, State, Local not specified - PPO | Admitting: Family Medicine

## 2018-06-10 VITALS — BP 141/91 | HR 71 | Temp 98.7°F | Resp 20 | Ht 72.0 in | Wt 357.6 lb

## 2018-06-10 DIAGNOSIS — R1011 Right upper quadrant pain: Secondary | ICD-10-CM | POA: Diagnosis not present

## 2018-06-10 DIAGNOSIS — R140 Abdominal distension (gaseous): Secondary | ICD-10-CM | POA: Insufficient documentation

## 2018-06-10 DIAGNOSIS — M546 Pain in thoracic spine: Secondary | ICD-10-CM | POA: Diagnosis not present

## 2018-06-10 DIAGNOSIS — R109 Unspecified abdominal pain: Secondary | ICD-10-CM | POA: Diagnosis not present

## 2018-06-10 NOTE — Progress Notes (Signed)
Patient ID: Adrian [name], male  DOB: Nov 11, 1960, 57 y.o.   MRN: [id number] Patient Care Team    Relationship Specialty Notifications Start End  Ma Hillock, DO PCP - General Family Medicine  02/06/18   Tanda Rockers, MD Consulting Physician Pulmonary Disease  02/07/18   Otelia Sergeant, OD Referring Physician   02/07/18   Gastroenterology, [facility]    02/19/18    Comment: was Dr. Cristina Gong    Chief Complaint  Patient presents with  . Back Pain    Subjective:  Adrian [name] is a 56 y.o.  male present for epigastric and thoracic pain:  He is not taking gabapentin, never started it. He is taking the Flexeril QHS and  PPI. He reports possible mild improvement in his abdominal discomfort after PPI start. However, he still experiences symptoms. All his abd symptoms are while laying flat at night. Symptoms of bloating and epigastric discomfort will wake him from sleep. He wakes and feels like he has to belch. He recently had his CPAP settings changed hoping the decrease pressure would help his bloating, but it did not. Back pain is better with flexeril use, but still has spasms that take his breath away when laying on his right side, pain radiates from stomach to his back.  Thoracic xray resulted with degenerative changes. Prior note:  Her reports his "flank" pain is still present. If the flexeril is helping it is only minimally. He is unable to lay on his right side without pain. He denies fever, chills, nausea, vomit bowel/urinary  Changes. He does endorse bloating.   Prior note 04/22/2018: Pt presents for an OV with complaints of right flank pain of 10 days duration.  Worsening over last 4 days.  Associated symptoms include nausea, worse with muscle skeletal movement after rest and discomfort is worse at night causing sleep disturbance.  He states there has been mild improvement today.  He has been taking Tylenol for discomfort.  He was seen in the emergency room over the weekend and  declined imaging studies to rule out kidney stone.  Urinalysis had trace hemoglobin in the emergency room.  Does not have a history of kidney stones.  Has a history of diverticulosis, but flares usually are left-sided for him if he is going to have one.  He denies fever, chills, vomiting, fatigue, changes in bowel habits, melena or dysuria.  He reports his urinary output is normal for him without any changes.  Endorses some heavier lifting earlier in the week prior to onset of discomfort.  Dg Thoracic Spine W/swimmers  Result Date: 05/12/2018 CLINICAL DATA:  Back pain for several weeks, no known injury, initial encounter EXAM: THORACIC SPINE - 3 VIEWS COMPARISON:  CT from 01/31/2017 FINDINGS: Multilevel osteophytic changes are noted. No definitive compression deformity is seen. No paraspinal mass lesion or acute rib abnormality is noted. IMPRESSION: Osteophytic changes without acute abnormality. Electronically Signed   By: Inez Catalina M.D.   On: 05/12/2018 15:00     Depression screen [facility] 2/9 04/23/2018 02/06/2018  Decreased Interest 0 0  Down, Depressed, Hopeless 0 0  PHQ - 2 Score 0 0   No flowsheet data found.     Fall Risk  04/23/2018 02/06/2018  Falls in the past year? No No     Immunization History  Administered Date(s) Administered  . Influenza Whole 04/22/2017  . Influenza,inj,Quad PF,6+ Mos 05/20/2015, 05/12/2018  . Influenza,inj,quad, With Preservative 05/20/2015  . Pneumococcal Conjugate-13 05/12/2018  .  Pneumococcal Polysaccharide-23 02/02/2017    No exam data present  Past Medical History:  Diagnosis Date  . Adenomatous colon polyp    FS 02/2004; colo 2005 hyperplastic polyp; 2010 neg - butmucosal prolapse.   . Allergy   . Asthma   . Blood in stool   . Chicken pox   . Colon polyps   . Diabetes mellitus without complication ([place]) 85/0277  . Diverticulosis   . Frequent headaches   . Genital warts   . GERD (gastroesophageal reflux disease)   . Hemorrhoid   .  Hyperlipidemia   . Hypertension   . Hypogonadism in male    labs x2: 11/14/2015 - 216 and 11/17/2015 193; declined med at that time.   . Insomnia   . OSA on CPAP 09/18/2016   HST 09/18/2016; ESS-4; AHI-76; O2 min- 61%  . Pulmonary embolism ([place]) 01/2017   provoked by long care ride, on xarelto, est Dr. Melvyn Novas  . Thyroid disease    subclinical hypothyroidism per records   Allergies  Allergen Reactions  . Lisinopril Cough  . Statins Other (See Comments)    Causes pain/ foot trouble   Past Surgical History:  Procedure Laterality Date  . ANKLE SURGERY     Family History  Problem Relation Age of Onset  . Asthma Mother   . Arthritis Mother   . Allergies Brother   . Alcohol abuse Maternal Grandmother   . Cancer Maternal Grandmother    Social History   Socioeconomic History  . Marital status: Married    Spouse name: Not on file  . Number of children: Not on file  . Years of education: Not on file  . Highest education level: Not on file  Occupational History  . Not on file  Social Needs  . Financial resource strain: Not on file  . Food insecurity:    Worry: Not on file    Inability: Not on file  . Transportation needs:    Medical: Not on file    Non-medical: Not on file  Tobacco Use  . Smoking status: Never Smoker  . Smokeless tobacco: Never Used  Substance and Sexual Activity  . Alcohol use: Yes    Alcohol/week: 3.0 standard drinks    Types: 3 Glasses of wine per week  . Drug use: No  . Sexual activity: Yes    Partners: Female  Lifestyle  . Physical activity:    Days per week: Not on file    Minutes per session: Not on file  . Stress: Not on file  Relationships  . Social connections:    Talks on phone: Not on file    Gets together: Not on file    Attends religious service: Not on file    Active member of club or organization: Not on file    Attends meetings of clubs or organizations: Not on file    Relationship status: Not on file  . Intimate partner  violence:    Fear of current or ex partner: Not on file    Emotionally abused: Not on file    Physically abused: Not on file    Forced sexual activity: Not on file  Other Topics Concern  . Not on file  Social History Narrative   Marital status/children/pets: Married.   Education/employment: Associates degree, employed as a Armed forces operational officer:      -Wears a bicycle helmet riding a bike: No     -smoke alarm in the home:Yes     -  wears seatbelt: Yes     - Feels safe in their relationships: Yes   Allergies as of 06/10/2018      Reactions   Lisinopril Cough   Statins Other (See Comments)   Causes pain/ foot trouble      Medication List        Accurate as of 06/10/18  5:30 PM. Always use your most recent med list.          blood glucose meter kit and supplies Kit Dispense based on patient and insurance preference. Use up to 2 times daily as directed.   cyclobenzaprine 10 MG tablet Commonly known as:  FLEXERIL Take 1 tablet (10 mg total) by mouth 2 (two) times daily as needed for muscle spasms.   ezetimibe 10 MG tablet Commonly known as:  ZETIA Take 1 tablet (10 mg total) by mouth daily.   gabapentin 300 MG capsule Commonly known as:  NEURONTIN Take 1 capsule (300 mg total) by mouth at bedtime.   hydrochlorothiazide 25 MG tablet Commonly known as:  HYDRODIURIL Take 1 tablet (25 mg total) by mouth daily.   KLS ALLER-FLO 50 MCG/ACT nasal spray Generic drug:  fluticasone Place into both nostrils daily.   losartan 25 MG tablet Commonly known as:  COZAAR Take 1 tablet (25 mg total) by mouth daily.   metFORMIN 500 MG tablet Commonly known as:  GLUCOPHAGE Take 1 tablet (500 mg total) by mouth 2 (two) times daily with a meal.   mometasone 220 MCG/INH inhaler Commonly known as:  ASMANEX Inhale 2 puffs into the lungs daily.   montelukast 10 MG tablet Commonly known as:  SINGULAIR Take 1 tablet (10 mg total) by mouth at bedtime.   omeprazole 40 MG  capsule Commonly known as:  PRILOSEC Take 1 capsule (40 mg total) by mouth daily.   rivaroxaban 10 MG Tabs tablet Commonly known as:  XARELTO Take 1 tablet (10 mg total) by mouth daily.   UNABLE TO FIND Med Name: CPAP       All past medical history, surgical history, allergies, family history, immunizations andmedications were updated in the EMR today and reviewed under the history and medication portions of their EMR.     No results found.   ROS: 14 pt review of systems performed and negative (unless mentioned in an HPI)  Objective: BP (!) 141/91 (BP Location: Left Arm, Patient Position: Sitting, Cuff Size: Large)   Pulse 71   Temp 98.7 F (37.1 C)   Resp 20   Ht 6' (1.829 m)   Wt (!) 357 lb 9.6 oz (162.2 kg)   SpO2 95%   BMI 48.50 kg/m  Gen: Afebrile. No acute distress. Nontoxic in appearance. Morbidly obese male.  HENT: AT. [place].  MMM.  Eyes:Pupils Equal Round Reactive to light, Extraocular movements intact,  Conjunctiva without redness, discharge or icterus.y CV: RRR  Chest: CTAB, no wheeze or crackles Abd: Soft. Obese. Mild distention. TTP epigastric and right abdomen. BS present. Body habitus interferes with exam.  Skin: no rashes, purpura or petechiae.  Neuro: Normal gait. PERLA. EOMi. Alert. Oriented. Psych: Normal affect, dress and demeanor. Normal speech. Normal thought content and judgment.  Assessment/plan: Adrian [name] is a 57 y.o. male present for  Thoracic pain/bloating/epigastric pain/abd pain that radiates to back:  -night time GI symptoms and epigastric pain > 8 weeks. Symptoms remain  despite  dietary modifications and medications. Pain with laying on right side is concerning.  Image study warranted to rule out abd/aortic  mass, infarct w/ h/o PE/blood clots given his has radiation of symptoms to his back. Body habitus makes exam very difficult.  - also need to consider hiatal hernia. If CT w/out cause refer to GI to consider endoscopy. - labs were  normal. - weight loss recommended.   - Continue with pillow modifications.  - try gabapentin QHS - continue  PPI.   - continue flexeril for thoracic spasms.  - Colonoscopy UTD. - F/U dependent on image results, appropriate specialist referral (GI or surgery) will be placed if warranted after image.   Return if symptoms worsen or fail to improve.   Note is dictated utilizing voice recognition software. Although note has been proof read prior to signing, occasional typographical errors still can be missed. If any questions arise, please do not hesitate to call for verification.  Electronically signed by: Howard Pouch, DO [place]

## 2018-06-10 NOTE — Patient Instructions (Signed)
Continue the omeprazole and follow GERD diet.  I will try to get an image study of abdomen, they will call you set you.  If needed we will refer you to GI as well.    Hiatal Hernia A hiatal hernia occurs when part of the stomach slides above the muscle that separates the abdomen from the chest (diaphragm). A person can be born with a hiatal hernia (congenital), or it may develop over time. In almost all cases of hiatal hernia, only the top part of the stomach pushes through the diaphragm. Many people have a hiatal hernia with no symptoms. The larger the hernia, the more likely it is that you will have symptoms. In some cases, a hiatal hernia allows stomach acid to flow back into the tube that carries food from your mouth to your stomach (esophagus). This may cause heartburn symptoms. Severe heartburn symptoms may mean that you have developed a condition called gastroesophageal reflux disease (GERD). What are the causes? This condition is caused by a weakness in the opening (hiatus) where the esophagus passes through the diaphragm to attach to the upper part of the stomach. A person may be born with a weakness in the hiatus, or a weakness can develop over time. What increases the risk? This condition is more likely to develop in:  Older people. Age is a major risk factor for a hiatal hernia, especially if you are over the age of 62.  Pregnant women.  People who are overweight.  People who have frequent constipation.  What are the signs or symptoms? Symptoms of this condition usually develop in the form of GERD symptoms. Symptoms include:  Heartburn.  Belching.  Indigestion.  Trouble swallowing.  Coughing or wheezing.  Sore throat.  Hoarseness.  Chest pain.  Nausea and vomiting.  How is this diagnosed? This condition may be diagnosed during testing for GERD. Tests that may be done include:  X-rays of your stomach or chest.  An upper gastrointestinal (GI) series. This is  an X-ray exam of your GI tract that is taken after you swallow a chalky liquid that shows up clearly on the X-ray.  Endoscopy. This is a procedure to look into your stomach using a thin, flexible tube that has a tiny camera and light on the end of it.  How is this treated? This condition may be treated by:  Dietary and lifestyle changes to help reduce GERD symptoms.  Medicines. These may include: ? Over-the-counter antacids. ? Medicines that make your stomach empty more quickly. ? Medicines that block the production of stomach acid (H2 blockers). ? Stronger medicines to reduce stomach acid (proton pump inhibitors).  Surgery to repair the hernia, if other treatments are not helping.  If you have no symptoms, you may not need treatment. Follow these instructions at home: Lifestyle and activity  Do not use any products that contain nicotine or tobacco, such as cigarettes and e-cigarettes. If you need help quitting, ask your health care provider.  Try to achieve and maintain a healthy body weight.  Avoid putting pressure on your abdomen. Anything that puts pressure on your abdomen increases the amount of acid that may be pushed up into your esophagus. ? Avoid bending over, especially after eating. ? Raise the head of your bed by putting blocks under the legs. This keeps your head and esophagus higher than your stomach. ? Do not wear tight clothing around your chest or stomach. ? Try not to strain when having a bowel movement, when urinating,  or when lifting heavy objects. Eating and drinking  Avoid foods that can worsen GERD symptoms. These may include: ? Fatty foods, like fried foods. ? Citrus fruits, like oranges or lemon. ? Other foods and drinks that contain acid, like orange juice or tomatoes. ? Spicy food. ? Chocolate.  Eat frequent small meals instead of three large meals a day. This helps prevent your stomach from getting too full. ? Eat slowly. ? Do not lie down right  after eating. ? Do not eat 1-2 hours before bed.  Do not drink beverages with caffeine. These include cola, coffee, cocoa, and tea.  Do not drink alcohol. General instructions  Take over-the-counter and prescription medicines only as told by your health care provider.  Keep all follow-up visits as told by your health care provider. This is important. Contact a health care provider if:  Your symptoms are not controlled with medicines or lifestyle changes.  You are having trouble swallowing.  You have coughing or wheezing that will not go away. Get help right away if:  Your pain is getting worse.  Your pain spreads to your arms, neck, jaw, teeth, or back.  You have shortness of breath.  You sweat for no reason.  You feel sick to your stomach (nauseous) or you vomit.  You vomit blood.  You have bright red blood in your stools.  You have black, tarry stools. This information is not intended to replace advice given to you by your health care provider. Make sure you discuss any questions you have with your health care provider. Document Released: 09/29/2003 Document Revised: 07/02/2016 Document Reviewed: 07/02/2016 Elsevier Interactive Patient Education  [employer].

## 2018-06-11 ENCOUNTER — Ambulatory Visit (INDEPENDENT_AMBULATORY_CARE_PROVIDER_SITE_OTHER)
Admission: RE | Admit: 2018-06-11 | Discharge: 2018-06-11 | Disposition: A | Discharge status: Home / Self Care | Payer: Federal, State, Local not specified - PPO | Source: Ambulatory Visit | Attending: Family Medicine | Admitting: Family Medicine

## 2018-06-11 DIAGNOSIS — R109 Unspecified abdominal pain: Secondary | ICD-10-CM

## 2018-06-11 DIAGNOSIS — M546 Pain in thoracic spine: Secondary | ICD-10-CM

## 2018-06-11 DIAGNOSIS — R1011 Right upper quadrant pain: Secondary | ICD-10-CM

## 2018-06-11 DIAGNOSIS — K760 Fatty (change of) liver, not elsewhere classified: Secondary | ICD-10-CM | POA: Diagnosis not present

## 2018-06-11 MED ORDER — IOPAMIDOL (ISOVUE-300) INJECTION 61%
100.0000 mL | Freq: Once | INTRAVENOUS | Status: AC | PRN
  Administered 2018-06-11: 100 mL via INTRAVENOUS

## 2018-06-12 ENCOUNTER — Encounter: Payer: Self-pay | Admitting: Family Medicine

## 2018-06-12 ENCOUNTER — Telehealth: Payer: Self-pay | Admitting: Family Medicine

## 2018-06-12 DIAGNOSIS — R109 Unspecified abdominal pain: Secondary | ICD-10-CM

## 2018-06-12 DIAGNOSIS — I700 Atherosclerosis of aorta: Secondary | ICD-10-CM | POA: Insufficient documentation

## 2018-06-12 DIAGNOSIS — R140 Abdominal distension (gaseous): Secondary | ICD-10-CM

## 2018-06-12 NOTE — Telephone Encounter (Signed)
Please inform patient the following information: His CT of abd is normal. No reason for his abd pain. I am referring him to GI to further evaluate.

## 2018-06-12 NOTE — Telephone Encounter (Signed)
Spoke with patient reviewed CT results ,instructions and information. Patient verbalized understanding.

## 2018-06-18 ENCOUNTER — Encounter: Payer: Self-pay | Admitting: Gastroenterology

## 2018-07-15 DIAGNOSIS — G4733 Obstructive sleep apnea (adult) (pediatric): Secondary | ICD-10-CM | POA: Diagnosis not present

## 2018-07-29 ENCOUNTER — Ambulatory Visit: Payer: Federal, State, Local not specified - PPO

## 2018-08-04 ENCOUNTER — Encounter: Payer: Self-pay | Admitting: Gastroenterology

## 2018-08-04 ENCOUNTER — Ambulatory Visit: Payer: Federal, State, Local not specified - PPO | Admitting: Gastroenterology

## 2018-08-04 VITALS — BP 132/80 | HR 68 | Ht 73.0 in | Wt 360.0 lb

## 2018-08-04 DIAGNOSIS — R140 Abdominal distension (gaseous): Secondary | ICD-10-CM

## 2018-08-04 DIAGNOSIS — Z86010 Personal history of colonic polyps: Secondary | ICD-10-CM

## 2018-08-04 DIAGNOSIS — Z7189 Other specified counseling: Secondary | ICD-10-CM

## 2018-08-04 DIAGNOSIS — R142 Eructation: Secondary | ICD-10-CM | POA: Diagnosis not present

## 2018-08-04 NOTE — Progress Notes (Signed)
Chief Complaint: Bloating, belching   Referring Provider:     Ma Hillock, DO    HPI:     Adrian [name] is a 58 y.o. male referred to the Gastroenterology Clinic for evaluation of epigastric discomfort, increased belching, and bloating.  Was recently seen by his PCM on 06/10/2018.  Reported mild improvement in MEG discomfort at that time after starting PPI, but given ongoing symptoms was referred to GI for further evaluation.  Was also prescribed Flexeril for flank pain with some improvement.  Was additionally evaluated with CT abdomen/pelvis in 05/2018 notable for decreased hepatic attenuation (hepatic steatosis), normal GB, no duct dilation, normal pancreas and spleen, normal-appearing stomach, small bowel, colon.  No AAA or LAD.  Normal CMP, CBC  He states he is index symptoms were described as pressure pain in the MEG and flanks, which has been worse at night, waking him from sleep. Sxs improve with belching along with starting Prilosec and decreasing CPAP pressures.  Today, he states his symptoms are much improved since the intervention by his PCM, and near back to baseline now.  He is otherwise tolerating all p.o. intake, no nausea or vomiting, no changes in bowel habits. No HB or regurgitation. No hematochezia or melena.  No dysphagia or odynophagia.  No changes in weight.  He was previously followed by Dr. Cristina Gong at [place], last seen in 01/2018.  Endoscopic history: - Colonoscopy (10/2014, Dr. Cristina Gong): 2 Tubular Adenomas in ascending colon, 2 benign transverse colon polyps, sigmoid diverticulosis.  Repeat in 5 years.   Past Medical History:  Diagnosis Date  . Adenomatous colon polyp    FS 02/2004; colo 2005 hyperplastic polyp; 2010 neg - butmucosal prolapse.   . Allergy   . Asthma   . Blood in stool   . Chicken pox   . Colon polyps   . Diabetes mellitus without complication ([place]) 24/4628  . Diverticulosis   . Frequent headaches   . Genital warts   .  GERD (gastroesophageal reflux disease)   . Hemorrhoid   . Hyperlipidemia   . Hypertension   . Hypogonadism in male    labs x2: 11/14/2015 - 216 and 11/17/2015 193; declined med at that time.   . Insomnia   . OSA on CPAP 09/18/2016   HST 09/18/2016; ESS-4; AHI-76; O2 min- 61%  . Pulmonary embolism ([place]) 01/2017   provoked by long care ride, on xarelto, est Dr. Melvyn Novas  . Thyroid disease    subclinical hypothyroidism per records     Past Surgical History:  Procedure Laterality Date  . ANKLE SURGERY    . COLONOSCOPY  2016   dr Cristina Gong   . ESOPHAGOGASTRODUODENOSCOPY     with dr Cristina Gong    Family History  Problem Relation Age of Onset  . Asthma Mother   . Arthritis Mother   . Allergies Brother   . Alcohol abuse Maternal Grandmother   . Cancer Maternal Grandmother   . Hiatal hernia Maternal Grandfather   . Colon cancer Neg Hx   . Esophageal cancer Neg Hx    Social History   Tobacco Use  . Smoking status: Never Smoker  . Smokeless tobacco: Never Used  Substance Use Topics  . Alcohol use: Yes    Alcohol/week: 3.0 standard drinks    Types: 3 Glasses of wine per week  . Drug use: No   Current Outpatient Medications  Medication Sig Dispense Refill  .  ezetimibe (ZETIA) 10 MG tablet Take 1 tablet (10 mg total) by mouth daily. 90 tablet 3  . fluticasone (KLS ALLER-FLO) 50 MCG/ACT nasal spray Place into both nostrils daily.    Marland Kitchen gabapentin (NEURONTIN) 300 MG capsule Take 1 capsule (300 mg total) by mouth at bedtime. 90 capsule 0  . hydrochlorothiazide (HYDRODIURIL) 25 MG tablet Take 1 tablet (25 mg total) by mouth daily. 90 tablet 1  . losartan (COZAAR) 25 MG tablet Take 1 tablet (25 mg total) by mouth daily. 90 tablet 1  . metFORMIN (GLUCOPHAGE) 500 MG tablet Take 1 tablet (500 mg total) by mouth 2 (two) times daily with a meal. 180 tablet 1  . mometasone (ASMANEX, 60 METERED DOSES,) 220 MCG/INH inhaler Inhale 2 puffs into the lungs daily. 1 Inhaler 0  . montelukast (SINGULAIR) 10  MG tablet Take 1 tablet (10 mg total) by mouth at bedtime. 90 tablet 3  . omeprazole (PRILOSEC) 40 MG capsule Take 1 capsule (40 mg total) by mouth daily. 30 capsule 3  . rivaroxaban (XARELTO) 10 MG TABS tablet Take 1 tablet (10 mg total) by mouth daily. 30 tablet 5  . UNABLE TO FIND Med Name: CPAP    . cyclobenzaprine (FLEXERIL) 10 MG tablet Take 1 tablet (10 mg total) by mouth 2 (two) times daily as needed for muscle spasms. (Patient not taking: Reported on 08/04/2018) 30 tablet 0   No current facility-administered medications for this visit.    Allergies  Allergen Reactions  . Lisinopril Cough  . Statins Other (See Comments)    Causes pain/ foot trouble     Review of Systems: All systems reviewed and negative except where noted in HPI.     Physical Exam:    Wt Readings from Last 3 Encounters:  08/04/18 (!) 360 lb (163.3 kg)  06/10/18 (!) 357 lb 9.6 oz (162.2 kg)  05/12/18 (!) 348 lb (157.9 kg)    BP 132/80   Pulse 68   Ht 6\' 1"  (1.854 m)   Wt (!) 360 lb (163.3 kg)   BMI 47.50 kg/m  Constitutional:  Pleasant, in no acute distress. Psychiatric: Normal mood and affect. Behavior is normal. EENT: Pupils normal.  Conjunctivae are normal. No scleral icterus. Neck supple. No cervical LAD. Cardiovascular: Normal rate, regular rhythm. No edema Pulmonary/chest: Effort normal and breath sounds normal. No wheezing, rales or rhonchi. Abdominal: Soft, nondistended, nontender. Bowel sounds active throughout. There are no masses palpable. No hepatomegaly. Neurological: Alert and oriented to person place and time. Skin: Skin is warm and dry. No rashes noted.   ASSESSMENT AND PLAN;   Adrian [name] is a 58 y.o. male presenting with increased belching and abdominal discomfort.  Symptoms have significantly improved since starting Prilosec 40 mg daily and decreasing CPAP pressures.  Discussed symptoms at length today and DDX, to include atypical reflux, hiatal hernia, etc.  Given the  significant clinical improvement and that he otherwise feels well, will hold off on more invasive evaluation at this time.  1) Belching: - Continue Prilosec 40 mg daily.  Can titrate to lowest effective dose in the future - Can trial OTC simethicone as needed -If symptoms not well controlled or other concerns, can certainly consider endoscopic evaluation in the future to evaluate for hiatal hernia, LES laxity, erosive esophagitis, etc.  2) Bloating: Also improved with the above intervention.  Can similarly treat with simethicone as needed -Low FODMAP diet  3) Counseling regarding long-term PPI use: I have reviewed the indications, risks, and  benefits of PPI therapy with the patient today. I have discussed studies that raise ? of increased osteoporosis, dementia, and kidney failure and explained that these studies show very weak associations of unclear significance and not clear cause and effect. We did discuss the potential for vitamin malabsorption, to include magnesium (very rare), calcium (easily modifiable with Calcium Citrate supplement), vitamin B12 (again, correctable with oral B12 supplement), and iron (although rarely clinically significant outside patients who require iron supplementation previously), and can monitor each of these periodically with routine labs. We have agreed to continue PPI treatment in this case. - Yearly chemistry panel - Yearly B12, iron panel - Titrate to lowest effective dose as noted above  4) History of Tubular Adenomas: -Repeat colonoscopy in 2021 for ongoing polyp surveillance.   Can follow-up with me as needed.   [place], DO, FACG  08/04/2018, 10:13 AM   Raoul Pitch, Renee A, DO

## 2018-08-04 NOTE — Patient Instructions (Signed)
If you are age 57 or older, your body mass index should be between 23-30. Your Body mass index is 47.5 kg/m. If this is out of the aforementioned range listed, please consider follow up with your Primary Care Provider.  If you are age 72 or younger, your body mass index should be between 19-25. Your Body mass index is 47.5 kg/m. If this is out of the aformentioned range listed, please consider follow up with your Primary Care Provider.   It was a pleasure to see you today!  Vito Cirigliano, D.O.

## 2018-08-05 ENCOUNTER — Other Ambulatory Visit: Payer: Self-pay | Admitting: Family Medicine

## 2018-08-05 ENCOUNTER — Ambulatory Visit: Payer: Federal, State, Local not specified - PPO

## 2018-08-05 ENCOUNTER — Telehealth: Payer: Self-pay

## 2018-08-05 DIAGNOSIS — S39012A Strain of muscle, fascia and tendon of lower back, initial encounter: Secondary | ICD-10-CM

## 2018-08-05 MED ORDER — CYCLOBENZAPRINE HCL 10 MG PO TABS: 10.0000 mg | ORAL_TABLET | Freq: Every day | ORAL | 0 refills | Status: DC

## 2018-08-05 NOTE — Telephone Encounter (Signed)
Called pt to verify his need for the pharmacy requested Gabapentin, pt verified that he would definitely need a refill on it prior to his next appt on 09/11/18. Refilled gabapentin for #30 w no additional refills.  While speaking with the pt, he also requested a refill on Flexeril. It Larena Glassman was prescribed on 06/10/18, for right sided thoracic pain #30 no rfs. Pt stated that he only takes it occasionally, when he has back pain. Pt has a follow up appt on 09/11/18.

## 2018-08-12 ENCOUNTER — Ambulatory Visit: Payer: Federal, State, Local not specified - PPO

## 2018-08-19 ENCOUNTER — Encounter: Payer: Federal, State, Local not specified - PPO | Attending: Family Medicine | Admitting: Dietician

## 2018-08-19 ENCOUNTER — Encounter: Payer: Self-pay | Admitting: Dietician

## 2018-08-19 DIAGNOSIS — E119 Type 2 diabetes mellitus without complications: Secondary | ICD-10-CM | POA: Diagnosis not present

## 2018-08-19 NOTE — Progress Notes (Signed)
Patient was seen on 08/19/2018 for the first of a series of three diabetes self-management courses at the [facility].  Patient Education Plan per assessed needs and concerns is to attend three course education program for Diabetes Self Management Education.  The following learning objectives were met by the patient during this class:  Describe diabetes, types of diabetes and pathophysiology  State some common risk factors for diabetes  Defines the role of glucose and insulin  Describe the relationship between diabetes and cardiovascular and other risks  State the members of the Healthcare Team  States the rationale for glucose monitoring and when to test  State their individual Target Range  State the importance of logging glucose readings and how to interpret the readings  Identifies A1C target  Explain the correlation between A1c and eAG values  State symptoms and treatment of high blood glucose and low blood glucose  Explain proper technique for glucose testing and identify proper sharps disposal  Handouts given during class include:  ADA Diabetes You Take Control   Meal Plan Card and carbohydrate content list  Dietary intake form  Low Sodium Flavoring Tips  Types of Fats  Dining Out  Label reading  Snack list  Planning a balanced meal  The diabetes portion plate  Diabetes Resources  A1c to eAG Conversion Chart  Blood Glucose Log  Diabetes Recommended Care Schedule  Support Group  Diabetes Success Plan  Core Class Satisfaction Survey   Follow-Up Plan:  Attend core 2

## 2018-08-23 ENCOUNTER — Other Ambulatory Visit: Payer: Self-pay | Admitting: Family Medicine

## 2018-08-26 ENCOUNTER — Encounter: Payer: Self-pay | Admitting: Dietician

## 2018-08-26 ENCOUNTER — Encounter: Payer: Federal, State, Local not specified - PPO | Attending: Family Medicine | Admitting: Dietician

## 2018-08-26 DIAGNOSIS — E119 Type 2 diabetes mellitus without complications: Secondary | ICD-10-CM | POA: Diagnosis not present

## 2018-08-26 NOTE — Progress Notes (Signed)
Patient was seen on 08/26/2018 for the second of a series of three diabetes self-management courses at the [facility]. The following learning objectives were met by the patient during this class:   Describe the role of different macronutrients on glucose  Explain how carbohydrates affect blood glucose  State what foods contain the most carbohydrates  Demonstrate carbohydrate counting  Demonstrate how to read Nutrition Facts food label  Describe effects of various fats on heart health  Describe the importance of good nutrition for health and healthy eating strategies  Describe techniques for managing your shopping, cooking and meal planning  List strategies to follow meal plan when dining out  Describe the effects of alcohol on glucose and how to use it safely  Goals:  Follow Diabetes Meal Plan as instructed  Aim to spread carbs evenly throughout the day  Aim for 3 meals per day and snacks as needed Include lean protein foods to meals/snacks  Monitor glucose levels as instructed by your doctor   Follow-Up Plan:  Attend Core 3  Work towards following your personal food plan.   

## 2018-09-02 ENCOUNTER — Encounter: Payer: Self-pay | Admitting: Dietician

## 2018-09-02 ENCOUNTER — Ambulatory Visit: Payer: Federal, State, Local not specified - PPO | Admitting: Dietician

## 2018-09-02 DIAGNOSIS — E119 Type 2 diabetes mellitus without complications: Secondary | ICD-10-CM | POA: Diagnosis not present

## 2018-09-02 NOTE — Progress Notes (Signed)
Patient was seen on 09/02/2018 for the third of a series of three diabetes self-management courses at the [facility].   Catalina Gravel the amount of activity recommended for healthy living . Describe activities suitable for individual needs . Identify ways to regularly incorporate activity into daily life . Identify barriers to activity and ways to over come these barriers  Identify diabetes medications being personally used and their primary action for lowering glucose and possible side effects . Describe role of stress on blood glucose and develop strategies to address psychosocial issues . Identify diabetes complications and ways to prevent them  Explain how to manage diabetes during illness . Evaluate success in meeting personal goal . Establish 2-3 goals that they will plan to diligently work on  Goals:   I will count my carb choices at most meals and snacks  I will be active 30 minutes or more 3 times a week  I will take my diabetes medications as scheduled  I will eat less unhealthy fats  Your patient has identified these potential barriers to change:  Stress  Your patient has identified their diabetes self-care support plan as  Family [employer] manager Diabetes Association Website    Plan:  Attend Support Group as desired

## 2018-09-04 ENCOUNTER — Encounter: Payer: Self-pay | Admitting: Internal Medicine

## 2018-09-04 ENCOUNTER — Ambulatory Visit: Payer: Federal, State, Local not specified - PPO | Admitting: Internal Medicine

## 2018-09-04 VITALS — BP 140/82 | HR 64 | Ht 73.0 in | Wt 359.2 lb

## 2018-09-04 DIAGNOSIS — J4530 Mild persistent asthma, uncomplicated: Secondary | ICD-10-CM | POA: Diagnosis not present

## 2018-09-04 DIAGNOSIS — I2699 Other pulmonary embolism without acute cor pulmonale: Secondary | ICD-10-CM | POA: Diagnosis not present

## 2018-09-04 MED ORDER — RIVAROXABAN 10 MG PO TABS: 10.0000 mg | ORAL_TABLET | Freq: Every day | ORAL | 11 refills | Status: DC

## 2018-09-04 NOTE — Assessment & Plan Note (Signed)
Dx 01/31/17 with assoc acute L DVT and neg hypercoagulable w/u  - Echo 02/01/17 Left ventricle: The cavity size was mildly dilated. There was   moderate hypertrophy of the septum with otherwise mild concentric   hypertrophy. Systolic function was normal. Wall motion was   normal; there were no regional wall motion abnormalities. Doppler   parameters are consistent with abnormal left ventricular   relaxation (grade 1 diastolic dysfunction). Doppler parameters   are consistent with indeterminate ventricular filling pressure. - Aortic valve: Transvalvular velocity was within the normal range.   There was no stenosis. There was no regurgitation. - Mitral valve: Transvalvular velocity was within the normal range.   There was no evidence for stenosis. There was no regurgitation. - Right ventricle: The cavity size was severely dilated. Wall   thickness was normal. Systolic function was severely reduced. - Tricuspid valve: There was mild regurgitation. - Pulmonary arteries: Systolic pressure was severely increased. PA   peak pressure: 61 mm Hg (S). Venous dopplers 07/24/17 >  Neg  ECHO 08/12/17> no PH but Pos LAE - 03/03/2018 rec change xarelto to 10 mg daily as maint duet to MO/ h/o semiprovoked PE    Back to baseline in every way but still not making headway with wt loss (see separate a/p)   Discussed in detail all the  indications, usual  risks and alternatives  relative to the benefits with patient who agrees to proceed with rx with low lose xarelto which has about the same bleeding risk as asa, which has no proven record at reducing dvt/pe, and good efficacy at reducing risk in this pt with MO who is relatively sedentary.   >>>  Ok with me to let him stay on the DOAC prophylactic dose indefinitely and refills per PCP or here yearly, whichever he/ she choses and depending on cost/availability thru his insurance with other option = hematology input as to risk/ beneft longterm.

## 2018-09-04 NOTE — Assessment & Plan Note (Signed)
complicated by hbp and dvt/pe 01/2017    Body mass index is 47.39 kg/m.  -  trending up  Lab Results  Component Value Date   TSH 3.36 02/06/2018     Contributing to gerd risk/ doe/reviewed the need and the process to achieve and maintain neg calorie balance > defer f/u primary care including intermittently monitoring thyroid status      I had an extended discussion with the patient reviewing all relevant studies completed to date and  lasting 15 to 20 minutes of a 25 minute final summary f/u office  visit    Each maintenance medication was reviewed in detail including most importantly the difference between maintenance and prns and under what circumstances the prns are to be triggered using an action plan format that is not reflected in the computer generated alphabetically organized AVS.     Please see AVS for specific instructions unique to this visit that I personally wrote and verbalized to the the pt in detail and then reviewed with pt  by my nurse highlighting any  changes in therapy recommended at today's visit to their plan of care.

## 2018-09-04 NOTE — Patient Instructions (Addendum)
Continue xarelto 10 mg daily - ok with your primary doctor refilling but not we need to see you yearly   Weight control is simply a matter of calorie balance which needs to be tilted in your favor by eating less and exercising more.  To get the most out of exercise, you need to be continuously aware that you are short of breath, but never out of breath, for 30 minutes daily. As you improve, it will actually be easier for you to do the same amount of exercise  in  30 minutes so always push to the level where you are short of breath.  If this does not result in gradual weight reduction then I strongly recommend you see a nutritionist with a food diary x 2 weeks so that we can work out a negative calorie balance which is universally effective in steady weight loss programs.  Think of your calorie balance like you do your bank account where in this case you want the balance to go down so you must take in less calories than you burn up.  It's just that simple:  Hard to do, but easy to understand.  Good luck!

## 2018-09-04 NOTE — Progress Notes (Signed)
Subjective:     Patient ID: Adrian [name], male   DOB: May 23, 1961,   MRN: [id number]     Brief patient profile:  58 yowm morbidly  obese complicated by osa/ cpap started April 2018 with tendency to leg cramps and gradual doe and then much worse p arrived back from car trip to [place] with dx of L dvt/ PE 01/2017 with neg hypercoagulable w/u on initial eval    Admit date: 01/31/2017 Discharge date: 02/03/2017   Discharge Diagnoses:  Principal Problem:   Acute PE (pulmonary thromboembolism) ([place])   Secondary pulmonary hypertension   Obesity   Hypertension   Asthma   CKD (chronic kidney disease), stage III   Acute respiratory failure with hypoxia ([facility])   OSA on CPAP   Diarrhea   Type 2 diabetes mellitus without complication ([place])   Pulmonary hypertension ([place])         Filed Weights   01/31/17 2030 02/01/17 0118  Weight: (!) 159.2 kg (351 lb) (!) 159.2 kg (351 lb)    History of present illness:  Adrian [name] a 58 y.o.malewith medical history significant of hypertension, asthma, CKD-3, OSA on CPAP, who presented with shortness of breath in the right flank pain. Pt recently drove to [facility] 19 days pta and drove back from [place] one week pta for vacation. Pt states that his SOB started soon after he arrived at home In ED:pt was found to be hypoxic, positive d-dimer 7.24, CT angiogram showed bilateral PE and evidence of right heart strain  Hospital Course:  Acute hypoxic resp failure -due to Acute PE, also has underlying OSA and asthma -hemodynamically stable, normotensive without hypoxia or significant dyspnea -started onIV heparin on admission and then transitionedto Xarelto,discussed DOACs and warfarin with pt  -ECHO notes moderate to severe Pulm HTN and RV dysfunction, suspect significant component of his Pulm HTN is related to his severe OSA/OHS and rest to his acute PE which part should get better with current anticoagulation -  needs Pulm FU, both  these recommendations given to patient and wife -Ambulated in halls without much symptoms -clinically felt to be stable for discharge home on Xarelto at this time  Acute Bilateral PE -as above -risk factor: recent travel and sedentary lifestyle - venous dopplers 02/02/17 >  acute deep vein thrombosis involving the posterior tibial veins of the left lower extremity.  Hypertension -Bp soft but stable  -resumed HCTZ and stopped ARB  Asthma -stable, nebs PRN  CKD (chronic kidney disease), stage 2 -creatinine in normal range now, was 1.4 on admission -improved and 0.9 at discharge  Severe OSA  -continue CPAP  Hyperglycemia/New DM -Hba1c is 6.5 -diet/weight loss and life style modification recommended -will need Rx down the road, consider Metformin first     03/13/2017 1st [facility] office visit/ Adrian [name]   Chief Complaint  Patient presents with  . Advice Only    Referred by Dr. Elesa [name] due to hx of pulmonary htn. Pt had a bilateral pulmonary embolism and was in hospital 01/31/17-02/03/17. Pt states that he is now breathing better than he had been.  no symptoms localized to L leg pre or post rx with xarelto but rather noct cramping in both  Breathing better than it was even in the last year pta H/o allergy/asthma since childhood maint on asthmex and singulair but not cough / noct wheeze or need for saba  rec No change in xarelto or asthma medications except for albuterol as follows  Only use your  albuterol as a rescue medication to be used if you can't catch your breath by resting or doing a relaxed purse lip breathing pattern.  - The less you use it, the better it will work when you need it. - Ok to use up to 2 puffs  every 4 hours if you must but call for immediate appointment if use goes up over your usual need - Don't leave home without it !!  (think of it like the spare tire for your car)   Wear the elastic stockings as much as you can   After the  first of the year you will need a repeat echo and venous doppler  Venous dopplers 07/24/17 >  Neg  ECHO 08/12/17> no PH    08/13/2017  f/u ov/Adrian [name] re: s/p PE rx since 01/2017  xarelto Chief Complaint  Patient presents with  . Follow-up    Breathing is doing well. He does c/o some right side low back pain.    positional R sided mid/ lower back pain s pleuritic features or urinary/ gi symptoms Doe = MMRC1 = can walk nl pace, flat grade, can't hurry or go uphills or steps s sob   rec Continue xarelto 20 mg daily  Weight control is simply a matter of calorie balance which needs to be tilted in your favor by eating less and exercising more.  To get the most out of exercise, you need to be continuously aware that you are short of breath, but never out of breath, for 30 minutes daily.    03/03/2018  f/u ov/Adrian [name] re:  Remote pe  maint on xarelto 20 mg /day based on MO risk Chief Complaint  Patient presents with  . Follow-up    Breathing is doing well and no co's.    Dyspnea:  Best it's been in 10 years - still sob x 2 flights Cough: some in am/ always relates to sensation nasal drainage despite singulair /flonase and asmanex 200 2 each am Sleeping: on cpap/ flat SABA use: no 02: no  rec Work on inhaler technique:   If cough worse can try asmanex 200 up to 2 puffs every 12 hours  Reduce xarelto to 10 mg daily - hold if active bleeding from any source     09/04/2018  f/u ov/Adrian [name] re: s/p  L dvt/ PE 01/2017  / mild chronic asthma  Chief Complaint  Patient presents with  . Follow-up    Breathing is overall doing well. He states he has had a cold for the past 6 wks "tickle" in throat.   Dyspnea:  Not limited by breathing from desired activities / no regular aerobics  Cough: min pnds/ nose always clogged up x life  Sleeping: cpap/ flat  SABA use: none - never used one  02: none    No obvious day to day or daytime variability or assoc excess/ purulent sputum or mucus plugs or hemoptysis or cp  or chest tightness, subjective wheeze or overt sinus or hb symptoms.   Sleeps as above without nocturnal  or early am exacerbation  of respiratory  c/o's or need for noct saba. Also denies any obvious fluctuation of symptoms with weather or environmental changes or other aggravating or alleviating factors except as outlined above   No unusual exposure hx or h/o childhood pna/ asthma or knowledge of premature birth.  Current Allergies, Complete Past Medical History, Past Surgical History, Family History, and Social History were reviewed in [employer] record.  ROS  The following are not active complaints unless bolded Hoarseness, sore throat, dysphagia, dental problems, itching, sneezing,  nasal congestion or discharge of excess mucus or purulent secretions, ear ache,   fever, chills, sweats, unintended wt loss or wt gain, classically pleuritic or exertional cp,  orthopnea pnd or arm/hand swelling  or leg swelling, presyncope, palpitations, abdominal pain, anorexia, nausea, vomiting, diarrhea  or change in bowel habits or change in bladder habits, change in stools or change in urine, dysuria, hematuria,  rash, arthralgias, visual complaints, headache, numbness, weakness or ataxia or problems with walking or coordination,  change in mood or  memory.        Current Meds  Medication Sig  . cyclobenzaprine (FLEXERIL) 10 MG tablet Take 1 tablet (10 mg total) by mouth at bedtime.  Marland Kitchen ezetimibe (ZETIA) 10 MG tablet Take 1 tablet (10 mg total) by mouth daily.  . fluticasone (KLS ALLER-FLO) 50 MCG/ACT nasal spray Place into both nostrils daily.  Marland Kitchen gabapentin (NEURONTIN) 300 MG capsule TAKE 1 CAPSULE BY MOUTH EVERYDAY AT BEDTIME  . hydrochlorothiazide (HYDRODIURIL) 25 MG tablet Take 1 tablet (25 mg total) by mouth daily.  Marland Kitchen losartan (COZAAR) 25 MG tablet Take 1 tablet (25 mg total) by mouth daily.  . metFORMIN (GLUCOPHAGE) 500 MG tablet Take 1 tablet (500 mg total) by mouth 2 (two)  times daily with a meal.  . mometasone (ASMANEX, 60 METERED DOSES,) 220 MCG/INH inhaler Inhale 2 puffs into the lungs daily.  . montelukast (SINGULAIR) 10 MG tablet Take 1 tablet (10 mg total) by mouth at bedtime.  Marland Kitchen omeprazole (PRILOSEC) 40 MG capsule Take 1 capsule (40 mg total) by mouth daily.  . rivaroxaban (XARELTO) 10 MG TABS tablet Take 1 tablet (10 mg total) by mouth daily.  Marland Kitchen UNABLE TO FIND Med Name: CPAP                              Objective:   Physical Exam    amb obese wm   09/04/2018          359  03/03/2018          348   08/13/2017          356   03/13/17 (!) 347 lb 6.4 oz (157.6 kg)  02/01/17 (!) 351 lb (159.2 kg)  10/15/15 (!) 350 lb (158.8 kg)     Vital signs reviewed - Note on arrival 02 sats  95% on RA    HEENT: nl dentition,  and oropharynx. Nl external ear canals without cough reflex Modified Mallampati Score =   2,  Moderate  bilateral non-specific turbinate edema s pallor, cyanosis or excess secretions    NECK :  without JVD/Nodes/TM/ nl carotid upstrokes bilaterally   LUNGS: no acc muscle use,  Nl contour chest which is clear to A and P bilaterally without cough on insp or exp maneuvers   CV:  RRR  no s3 or murmur or increase in P2, and no edema   ABD:  Quit obese but soft and nontender with limited  inspiratory excursion in the supine position. No bruits or organomegaly appreciated, bowel sounds nl  MS:  Nl gait/ ext warm without deformities, calf tenderness, cyanosis or clubbing No obvious joint restrictions   SKIN: warm and dry without lesions    NEURO:  alert, approp, nl sensorium with  no motor or cerebellar deficits apparent.        Assessment:

## 2018-09-04 NOTE — Assessment & Plan Note (Signed)
Well controlled 09/04/2018 on asmanex/ singulair / prn saba   Despite recent uri no wheeze at all or need for saba so All goals of chronic asthma control met including optimal function and elimination of symptoms with minimal need for rescue therapy.  Contingencies discussed in full including contacting this office immediately if not controlling the symptoms using the rule of two's.

## 2018-09-11 ENCOUNTER — Other Ambulatory Visit: Payer: Self-pay | Admitting: Family Medicine

## 2018-09-11 DIAGNOSIS — S39012A Strain of muscle, fascia and tendon of lower back, initial encounter: Secondary | ICD-10-CM

## 2018-09-12 ENCOUNTER — Ambulatory Visit: Payer: Federal, State, Local not specified - PPO | Admitting: Family Medicine

## 2018-09-12 NOTE — Telephone Encounter (Signed)
RF request for Flexeril LOV: 06/10/18 Next ov: 09/25/18 Last written: 08/05/18 #30 w/ 0RF  Pt reported taking occasionally on 08/05/18 for back pain.  Please advise, thanks.

## 2018-09-25 ENCOUNTER — Ambulatory Visit: Payer: Federal, State, Local not specified - PPO | Admitting: Family Medicine

## 2018-09-25 ENCOUNTER — Encounter: Payer: Self-pay | Admitting: Family Medicine

## 2018-09-25 VITALS — BP 139/88 | HR 63 | Temp 98.5°F | Resp 16 | Ht 73.0 in | Wt 363.2 lb

## 2018-09-25 DIAGNOSIS — E782 Mixed hyperlipidemia: Secondary | ICD-10-CM | POA: Diagnosis not present

## 2018-09-25 DIAGNOSIS — I10 Essential (primary) hypertension: Secondary | ICD-10-CM

## 2018-09-25 DIAGNOSIS — Z6841 Body Mass Index (BMI) 40.0 and over, adult: Secondary | ICD-10-CM

## 2018-09-25 DIAGNOSIS — I700 Atherosclerosis of aorta: Secondary | ICD-10-CM

## 2018-09-25 DIAGNOSIS — E119 Type 2 diabetes mellitus without complications: Secondary | ICD-10-CM

## 2018-09-25 DIAGNOSIS — M546 Pain in thoracic spine: Secondary | ICD-10-CM

## 2018-09-25 DIAGNOSIS — G8929 Other chronic pain: Secondary | ICD-10-CM

## 2018-09-25 LAB — POCT GLYCOSYLATED HEMOGLOBIN (HGB A1C)
HEMOGLOBIN A1C: 6.3 % (ref 4.0–5.6)
HbA1c, POC (controlled diabetic range): 6.3 % (ref 0.0–7.0)
HbA1c, POC (prediabetic range): 6.3 % (ref 5.7–6.4)
Hemoglobin A1C: 6.3 % — AB (ref 4.0–5.6)

## 2018-09-25 MED ORDER — METFORMIN HCL 500 MG PO TABS: 500.0000 mg | ORAL_TABLET | Freq: Two times a day (BID) | ORAL | 1 refills | Status: DC

## 2018-09-25 MED ORDER — GABAPENTIN 300 MG PO CAPS: ORAL_CAPSULE | ORAL | 3 refills | Status: DC

## 2018-09-25 MED ORDER — CYCLOBENZAPRINE HCL 10 MG PO TABS: ORAL_TABLET | ORAL | 5 refills | Status: DC

## 2018-09-25 MED ORDER — LOSARTAN POTASSIUM 50 MG PO TABS: 50.0000 mg | ORAL_TABLET | Freq: Every day | ORAL | 1 refills | Status: DC

## 2018-09-25 MED ORDER — HYDROCHLOROTHIAZIDE 25 MG PO TABS: 25.0000 mg | ORAL_TABLET | Freq: Every day | ORAL | 1 refills | Status: DC

## 2018-09-25 NOTE — Patient Instructions (Signed)
Increase losartan to 50 mg total a day. Finish the bottle you have by taking two- 25 mg tabs. New bottle will be ONE 50 mg tab.   Watch your diet closely and get routine exercise. Your weight went up a little and your a1c also went yo to 6.3.  Make sure to schedule your diabetic exam with your eye doctor. - it is due after 3/28 Follow up in 4 months.

## 2018-09-25 NOTE — Progress Notes (Signed)
Patient ID: GRAIG HESSLING, male  DOB: 1960/10/14, 58 y.o.   MRN: [id number] Patient Care Team    Relationship Specialty Notifications Start End  Ma Hillock, DO PCP - General Family Medicine  02/06/18   Tanda Rockers, MD Consulting Physician Pulmonary Disease  02/07/18   Otelia Sergeant, OD Referring Physician   02/07/18   Gastroenterology, [facility]    02/19/18    Comment: was Dr. Cristina Gong    Chief Complaint  Patient presents with  . Hypertension  . Diabetes    Subjective:  SKYLOR SCHNAPP is a 58 y.o.  male present for [facility].  Essential hypertension/hyperlipidemia/morbid obesity/CKD3 Pt reports compliance with losartan 25 mg QD and HCTZ 25 mg daily. Blood pressures ranges at home not routinely checked.  Patient denies chest pain, shortness of breath, dizziness or lower extremity edema.  Pt is not taking a daily baby ASA. Pt is not prescribed statin 2/2 intolerance x2. Started zeita 3 months ago.  BMP: 04/22/2018 WNL CBC: 04/22/2018 WNL Lipid: 04/2018 LDL 135 TSH: 01/2018 WNL Diet: low Salt Exercise: Routine exercise RF: Hypertension, hyperlipidemia, obesity, diabetes.   Type 2 diabetes mellitus without complication, without long-term current use of insulin ([place]) Started on metformin after last visit for an A1c of 6.3. Patient denies dizziness, hyperglycemic or hypoglycemic events. Patient denies numbness, tingling in the extremities or nonhealing wounds of feet.  PNA series: Pneumococcal 23 /2018-->prevnar completed 2019 Flu shot: completed today (recommneded yearly) Microalbumin: on ARB Foot exam: 04/2018 UTD Eye exam:10/17/2017, Dr. Oswaldo Conroy,  [facility] A1c: 6.6 >>5.8>>>6.3  Depression screen [facility] 2/9 09/25/2018 08/19/2018 04/23/2018 02/06/2018  Decreased Interest 0 0 0 0  Down, Depressed, Hopeless 0 0 0 0  PHQ - 2 Score 0 0 0 0   No flowsheet data found.     Fall Risk  08/19/2018 04/23/2018 02/06/2018  Falls in the past year? 0 No No    Immunization History    Administered Date(s) Administered  . Influenza Whole 04/22/2017  . Influenza,inj,Quad PF,6+ Mos 05/20/2015, 05/12/2018  . Influenza,inj,quad, With Preservative 05/20/2015  . Pneumococcal Conjugate-13 05/12/2018  . Pneumococcal Polysaccharide-23 02/02/2017    No exam data present  Past Medical History:  Diagnosis Date  . Adenomatous colon polyp    FS 02/2004; colo 2005 hyperplastic polyp; 2010 neg - butmucosal prolapse.   . Allergy   . Asthma   . Blood in stool   . Chicken pox   . Colon polyps   . Diabetes mellitus without complication ([place]) 74/9449  . Diverticulosis   . Frequent headaches   . Genital warts   . GERD (gastroesophageal reflux disease)   . Hemorrhoid   . Hyperlipidemia   . Hypertension   . Hypogonadism in male    labs x2: 11/14/2015 - 216 and 11/17/2015 193; declined med at that time.   . Insomnia   . OSA on CPAP 09/18/2016   HST 09/18/2016; ESS-4; AHI-76; O2 min- 61%  . Pulmonary embolism ([place]) 01/2017   provoked by long care ride, on xarelto, est Dr. Melvyn Novas  . Thyroid disease    subclinical hypothyroidism per records   Allergies  Allergen Reactions  . Lisinopril Cough  . Statins Other (See Comments)    Causes pain/ foot trouble   Past Surgical History:  Procedure Laterality Date  . ANKLE SURGERY    . COLONOSCOPY  2016   dr Cristina Gong   . ESOPHAGOGASTRODUODENOSCOPY     with dr Cristina Gong    Family  History  Problem Relation Age of Onset  . Asthma Mother   . Arthritis Mother   . Allergies Brother   . Alcohol abuse Maternal Grandmother   . Cancer Maternal Grandmother   . Hiatal hernia Maternal Grandfather   . Colon cancer Neg Hx   . Esophageal cancer Neg Hx    Social History   Socioeconomic History  . Marital status: Married    Spouse name: Not on file  . Number of children: 2  . Years of education: Not on file  . Highest education level: Not on file  Occupational History  . Not on file  Social Needs  . Financial resource strain: Not on file   . Food insecurity:    Worry: Not on file    Inability: Not on file  . Transportation needs:    Medical: Not on file    Non-medical: Not on file  Tobacco Use  . Smoking status: Never Smoker  . Smokeless tobacco: Never Used  Substance and Sexual Activity  . Alcohol use: Yes    Alcohol/week: 3.0 standard drinks    Types: 3 Glasses of wine per week  . Drug use: No  . Sexual activity: Yes    Partners: Female  Lifestyle  . Physical activity:    Days per week: Not on file    Minutes per session: Not on file  . Stress: Not on file  Relationships  . Social connections:    Talks on phone: Not on file    Gets together: Not on file    Attends religious service: Not on file    Active member of club or organization: Not on file    Attends meetings of clubs or organizations: Not on file    Relationship status: Not on file  . Intimate partner violence:    Fear of current or ex partner: Not on file    Emotionally abused: Not on file    Physically abused: Not on file    Forced sexual activity: Not on file  Other Topics Concern  . Not on file  Social History Narrative   Marital status/children/pets: Married.   Education/employment: Associates degree, employed as a Armed forces operational officer:      -Wears a bicycle helmet riding a bike: No     -smoke alarm in the home:Yes     - wears seatbelt: Yes     - Feels safe in their relationships: Yes   Allergies as of 09/25/2018      Reactions   Lisinopril Cough   Statins Other (See Comments)   Causes pain/ foot trouble      Medication List       Accurate as of September 25, 2018 10:39 AM. Always use your most recent med list.        cyclobenzaprine 10 MG tablet Commonly known as:  FLEXERIL TAKE 1 TABLET BY MOUTH EVERYDAY AT BEDTIME PRN   ezetimibe 10 MG tablet Commonly known as:  ZETIA Take 1 tablet (10 mg total) by mouth daily.   gabapentin 300 MG capsule Commonly known as:  NEURONTIN TAKE 1 CAPSULE BY MOUTH EVERYDAY AT BEDTIME    hydrochlorothiazide 25 MG tablet Commonly known as:  HYDRODIURIL Take 1 tablet (25 mg total) by mouth daily.   KLS ALLER-FLO 50 MCG/ACT nasal spray Generic drug:  fluticasone Place into both nostrils daily.   losartan 50 MG tablet Commonly known as:  COZAAR Take 1 tablet (50 mg total) by mouth daily.  metFORMIN 500 MG tablet Commonly known as:  GLUCOPHAGE Take 1 tablet (500 mg total) by mouth 2 (two) times daily with a meal.   mometasone 220 MCG/INH inhaler Commonly known as:  ASMANEX (60 METERED DOSES) Inhale 2 puffs into the lungs daily.   montelukast 10 MG tablet Commonly known as:  SINGULAIR Take 1 tablet (10 mg total) by mouth at bedtime.   rivaroxaban 10 MG Tabs tablet Commonly known as:  XARELTO Take 1 tablet (10 mg total) by mouth daily.   UNABLE TO FIND Med Name: CPAP       All past medical history, surgical history, allergies, family history, immunizations andmedications were updated in the EMR today and reviewed under the history and medication portions of their EMR.     No results found.   ROS: 14 pt review of systems performed and negative (unless mentioned in an HPI)  Objective: BP 139/88 (BP Location: Left Arm, Patient Position: Sitting, Cuff Size: Large)   Pulse 63   Temp 98.5 F (36.9 C) (Oral)   Resp 16   Ht 6\' 1"  (1.854 m)   Wt (!) 363 lb 4 oz (164.8 kg)   SpO2 95%   BMI 47.93 kg/m  Gen: Afebrile. No acute distress. Nontoxic, obese, pleasant male.  HENT: AT. [place].  MMM.  Eyes:Pupils Equal Round Reactive to light, Extraocular movements intact,  Conjunctiva without redness, discharge or icterus. Neck/lymp/endocrine: Supple,no lymphadenopathy, no thyromegaly CV: RRR no murmur, no edema, +2/4 P posterior tibialis pulses Chest: CTAB, no wheeze or crackles Skin: no rashes, purpura or petechiae.  Neuro:  Normal gait. PERLA. EOMi. Alert. Oriented x3  Psych: Normal affect, dress and demeanor. Normal speech. Normal thought content and judgment.    Assessment/plan: OZELL JUHASZ is a 58 y.o. male present for tablets care Essential hypertension/hyperlipidemia/morbid obesity - remains borderline.  - increase losartan to 50 mg.   - Continue HCTZ 25 mg daily.   - low sodium diet - Advised him to increase his exercise greater than 150 minutes a week.   - tolerating Zeita.  -Follow-up 4 mos.   Type 2 diabetes mellitus without complication, without long-term current use of insulin ([facility]) - a1c and weight increased. Still good control with a1c < 6.5 Continue metformin 500 mg BID.  Continue diet and exercise modifications  - continue gabapentin QHS PNA series: Pneumococcal 23 02/02/2017-->prevnar completed today Flu shot: completed today (recommneded yearly) Microalbumin: on ARB Foot exam: 05/12/18 UTD Eye exam:10/17/2017, Dr. Oswaldo Conroy,  [facility]- records requested and he was encouraged to schedule this year A1c: 6.6 >>5.8>>6.3 today F/u 4 months  Thoracic pain - chronic intermittent.  - Continue with pillow modifications.  - flexeril 10 mg QHS PRN- refilled.  - f/u PRN  Return in about 4 months (around 01/25/2019).     Note is dictated utilizing voice recognition software. Although note has been proof read prior to signing, occasional typographical errors still can be missed. If any questions arise, please do not hesitate to call for verification.  Electronically signed by: Howard Pouch, DO [place]

## 2018-09-26 ENCOUNTER — Encounter: Payer: Self-pay | Admitting: Family Medicine

## 2018-11-04 ENCOUNTER — Other Ambulatory Visit: Payer: Self-pay | Admitting: Family Medicine

## 2018-11-04 DIAGNOSIS — I10 Essential (primary) hypertension: Secondary | ICD-10-CM

## 2018-11-04 NOTE — Telephone Encounter (Signed)
Received refill request for losartan. Pt was prescribed 90 days plus 1 refill 09/2018. Please make pt and pharmacy aware there are scripts avialable, possibly under a different number

## 2018-11-05 NOTE — Telephone Encounter (Signed)
Called pharmacy and RX is being filled and on file with pharmacy from 09/25/2018, not sure why this was sent per pharmacist

## 2019-01-09 DIAGNOSIS — G4733 Obstructive sleep apnea (adult) (pediatric): Secondary | ICD-10-CM | POA: Diagnosis not present

## 2019-01-16 ENCOUNTER — Encounter: Payer: Self-pay | Admitting: Family Medicine

## 2019-01-16 ENCOUNTER — Ambulatory Visit (INDEPENDENT_AMBULATORY_CARE_PROVIDER_SITE_OTHER): Payer: Federal, State, Local not specified - PPO | Admitting: Family Medicine

## 2019-01-16 VITALS — Ht 73.0 in

## 2019-01-16 DIAGNOSIS — R1013 Epigastric pain: Secondary | ICD-10-CM | POA: Diagnosis not present

## 2019-01-16 DIAGNOSIS — M546 Pain in thoracic spine: Secondary | ICD-10-CM

## 2019-01-16 MED ORDER — OMEPRAZOLE 40 MG PO CPDR: 40.0000 mg | DELAYED_RELEASE_CAPSULE | Freq: Every day | ORAL | 2 refills | Status: DC

## 2019-01-16 NOTE — Progress Notes (Signed)
VIRTUAL VISIT VIA VIDEO  I connected with Adrian [name] on 01/20/19 at  4:00 PM EDT by a video enabled telemedicine application and verified that I am speaking with the correct person using two identifiers. Location patient: Home Location provider: [facility], Office Persons participating in the virtual visit: Patient, Dr. Raoul Pitch and R.Baker, LPN  I discussed the limitations of evaluation and management by telemedicine and the availability of in person appointments. The patient expressed understanding and agreed to proceed. Patient Care Team    Relationship Specialty Notifications Start End  Ma Hillock, DO PCP - General Family Medicine  02/06/18   Tanda Rockers, MD Consulting Physician Pulmonary Disease  02/07/18   Otelia Sergeant, OD Referring Physician   02/07/18   Lavena Bullion, DO Consulting Physician Gastroenterology  01/20/19      SUBJECTIVE Chief Complaint  Patient presents with  . Back Pain    x1 week with gas pains and back pain. Pt is not sure if it has to do with his stomach and that is making his back hurt or if it two seperate issues. Denies fever and chills.     HPI: Adrian [name] is a 58 y.o. male present today to discuss his back pain.  Patient reports he is experiencing right-sided mid back to upper lower back pain of 1 week duration.  The pain feels like a constant pressure pain with occasional spasms.  He states he went swimming yesterday evening and had a very bad spasm in his thoracic spine.  He had a similar presentation last year that was resolved once starting a PPI and a muscle relaxer.  He states he thinks again this might be 2 separate issues he does not understand why presents at the same time.  He endorses feeling bloating and belching.  He also like his "stomach has air "in it.  He has been using a heating pad on his back which has been helpful. He has not been taking his PPI. He has been taking his gabapentin and flexeril.   04/22/2018  thoracic xray: FINDINGS: Multilevel osteophytic changes are noted. No definitive compression deformity is seen. No paraspinal mass lesion or acute rib abnormality is noted.  IMPRESSION: Osteophytic changes without acute abnormality  ROS: See pertinent positives and negatives per HPI.  Patient Active Problem List   Diagnosis Date Noted  . Chronic anticoagulation 01/20/2019  . Aortic atherosclerosis ([place]) 06/12/2018  . Abdominal pain with radiation to back 06/10/2018  . Acute right-sided thoracic back pain 05/12/2018  . Vitamin D deficiency 02/06/2018  . Mixed hyperlipidemia 02/06/2018  . Mild persistent asthma without complication 56/31/4970  . Morbid obesity due to excess calories ([place]) 03/14/2017  . Type 2 diabetes mellitus without complication ([place]) 26/37/8588  . Pulmonary hypertension ([place]) 02/02/2017  . OSA on CPAP 02/01/2017  . PE (pulmonary thromboembolism) ([place]) 01/31/2017  . Hypertension     Social History   Tobacco Use  . Smoking status: Never Smoker  . Smokeless tobacco: Never Used  Substance Use Topics  . Alcohol use: Yes    Alcohol/week: 3.0 standard drinks    Types: 3 Glasses of wine per week    Current Outpatient Medications:  .  cyclobenzaprine (FLEXERIL) 10 MG tablet, TAKE 1 TABLET BY MOUTH EVERYDAY AT BEDTIME PRN, Disp: 30 tablet, Rfl: 5 .  ezetimibe (ZETIA) 10 MG tablet, Take 1 tablet (10 mg total) by mouth daily., Disp: 90 tablet, Rfl: 3 .  fluticasone (KLS ALLER-FLO) 50  MCG/ACT nasal spray, Place into both nostrils daily., Disp: , Rfl:  .  gabapentin (NEURONTIN) 300 MG capsule, TAKE 1 CAPSULE BY MOUTH EVERYDAY AT BEDTIME, Disp: 90 capsule, Rfl: 3 .  hydrochlorothiazide (HYDRODIURIL) 25 MG tablet, Take 1 tablet (25 mg total) by mouth daily., Disp: 90 tablet, Rfl: 1 .  losartan (COZAAR) 50 MG tablet, Take 1 tablet (50 mg total) by mouth daily., Disp: 90 tablet, Rfl: 1 .  metFORMIN (GLUCOPHAGE) 500 MG tablet, Take 1 tablet (500 mg total) by mouth 2 (two)  times daily with a meal., Disp: 180 tablet, Rfl: 1 .  mometasone (ASMANEX, 60 METERED DOSES,) 220 MCG/INH inhaler, Inhale 2 puffs into the lungs daily., Disp: 1 Inhaler, Rfl: 0 .  montelukast (SINGULAIR) 10 MG tablet, Take 1 tablet (10 mg total) by mouth at bedtime., Disp: 90 tablet, Rfl: 3 .  rivaroxaban (XARELTO) 10 MG TABS tablet, Take 1 tablet (10 mg total) by mouth daily., Disp: 30 tablet, Rfl: 11 .  UNABLE TO FIND, Med Name: CPAP, Disp: , Rfl:  .  omeprazole (PRILOSEC) 40 MG capsule, Take 1 capsule (40 mg total) by mouth daily., Disp: 30 capsule, Rfl: 2  Allergies  Allergen Reactions  . Lisinopril Cough  . Statins Other (See Comments)    Causes pain/ foot trouble    OBJECTIVE: Ht 6\' 1"  (1.854 m)   BMI 47.93 kg/m  Gen: No acute distress. Nontoxic in appearance.  HENT: AT. [place].  MMM.  Eyes:Pupils Equal Round Reactive to light, Extraocular movements intact,  Conjunctiva without redness, discharge or icterus. Chest: Cough or shortness of breath not present Skin: no rashes, purpura or petechiae.  Neuro/msk:  Normal gait. Alert. Oriented x3. Normal ROM.  Psych: Normal affect, dress and demeanor. Normal speech. Normal thought content and judgment.  ASSESSMENT AND PLAN: Adrian [name] is a 58 y.o. male present for  Acute right-sided thoracic back pain - rest, heat, increase gabapentin 300 BID or 600 QHS- his choice. He will let us know.  - xray 04/2018 was reassuring.  - continue flexeril. - f/u 2 weeks prn.   Dyspepsia - uncertain if belching/bloating is also causing back discomfort.  - restart PPI. Omeprazole 40 mg QD prescribed. Treating his GI issues has helped resolve his discomfort in the past. - he was referred to GI w/ visit 08/04/18. Colonoscopy due 2021.  - f/u 2 weeks PRN  > 25 minutes spent with patient, >50% of time spent face to face    Howard Pouch, DO 01/20/2019

## 2019-01-20 ENCOUNTER — Encounter: Payer: Self-pay | Admitting: Family Medicine

## 2019-01-20 DIAGNOSIS — Z7901 Long term (current) use of anticoagulants: Secondary | ICD-10-CM | POA: Insufficient documentation

## 2019-02-02 ENCOUNTER — Ambulatory Visit: Payer: Federal, State, Local not specified - PPO | Admitting: Family Medicine

## 2019-02-11 ENCOUNTER — Other Ambulatory Visit: Payer: Self-pay

## 2019-02-11 DIAGNOSIS — J4530 Mild persistent asthma, uncomplicated: Secondary | ICD-10-CM

## 2019-02-11 MED ORDER — ASMANEX (60 METERED DOSES) 220 MCG/INH IN AEPB: 2.0000 | INHALATION_SPRAY | Freq: Every day | RESPIRATORY_TRACT | 0 refills | Status: DC

## 2019-02-15 IMAGING — DX DG CHEST 2V
3 series · 3 of 3 positions shown · non-contrast
Comparison: None.

CLINICAL DATA: Back and flank pain, shortness of breath.

EXAM:
CHEST  2 VIEW

[chest lat]
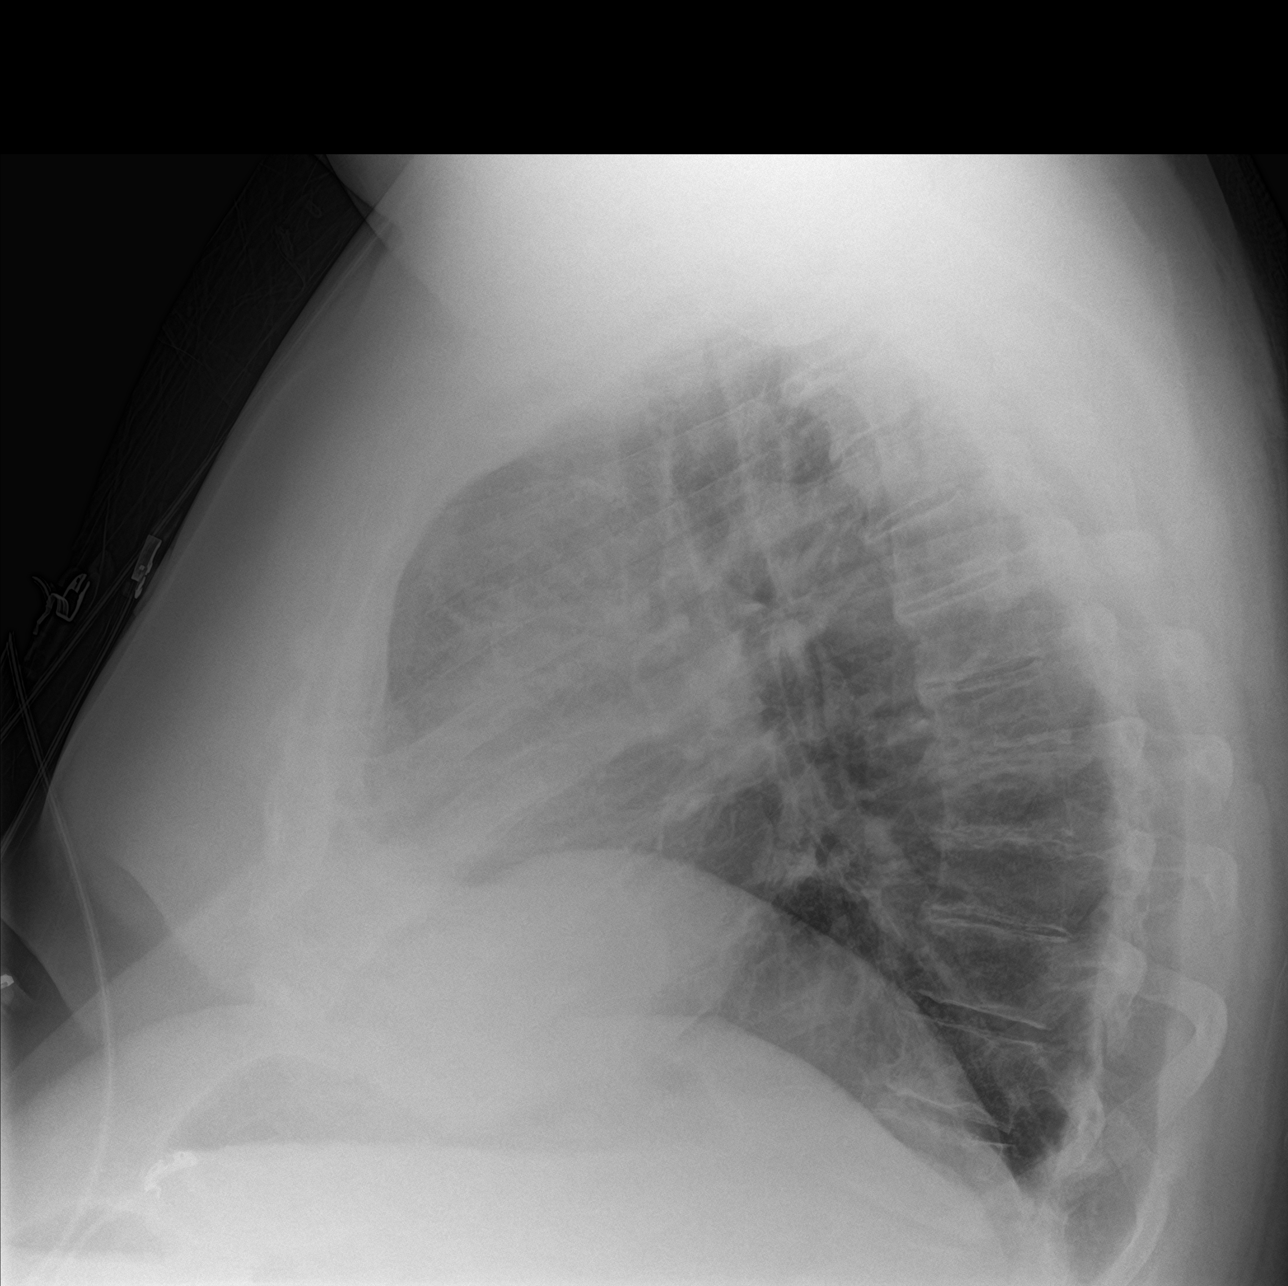

[chest pa (1 of 2)]
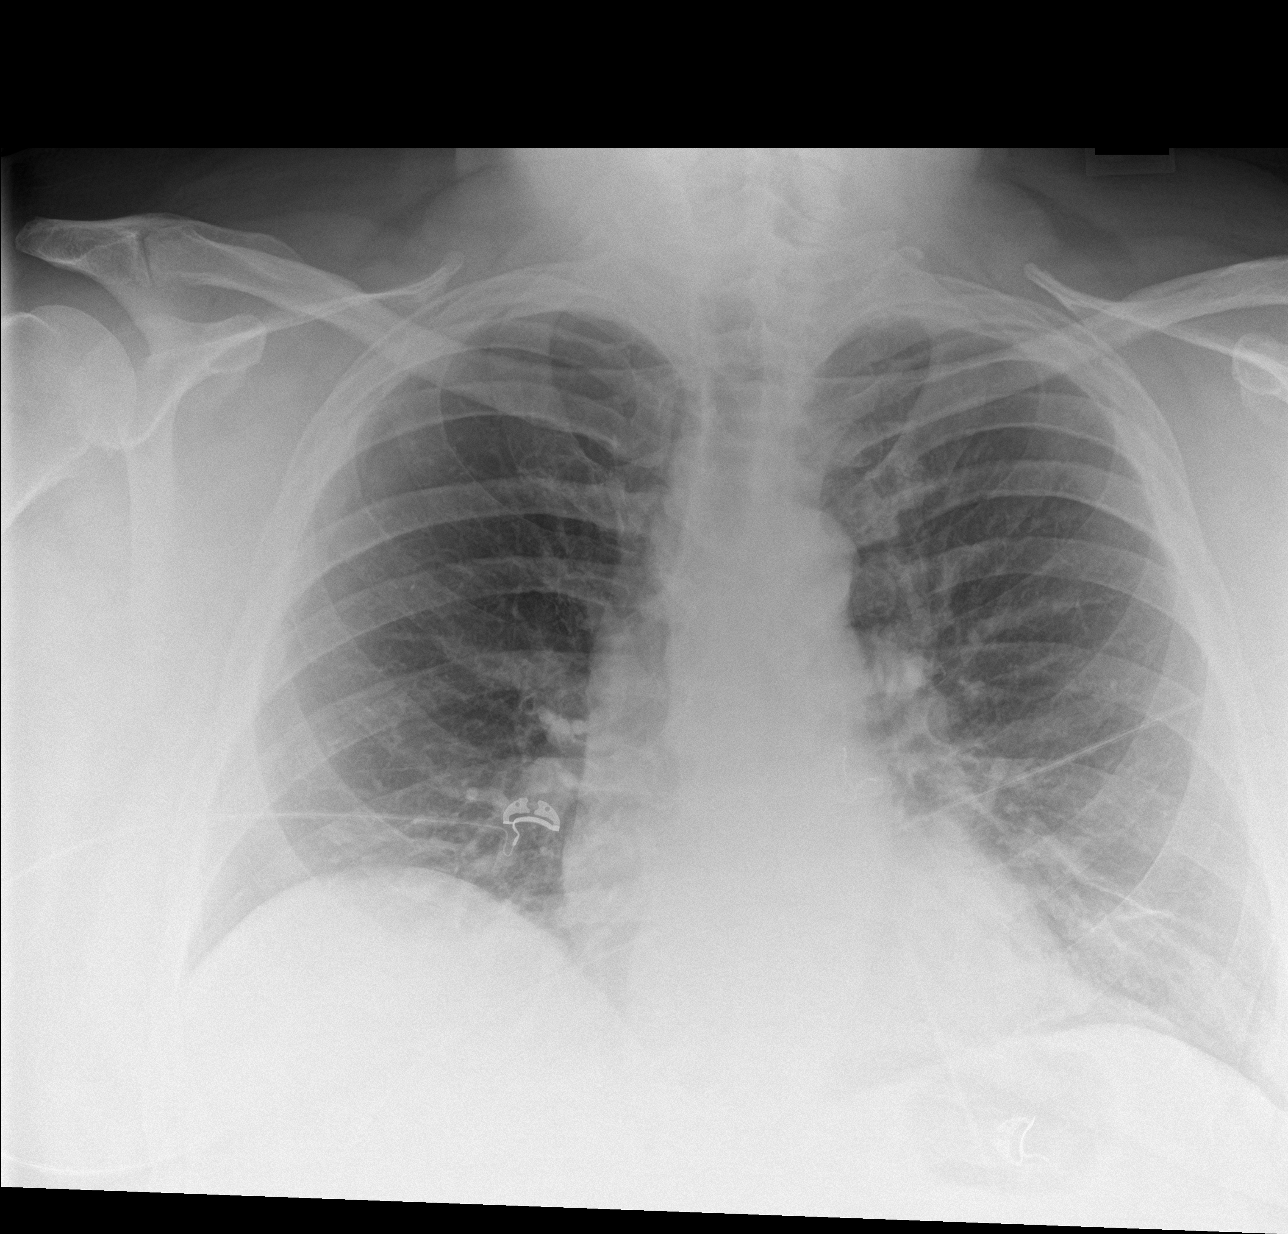

[chest pa (2 of 2)]
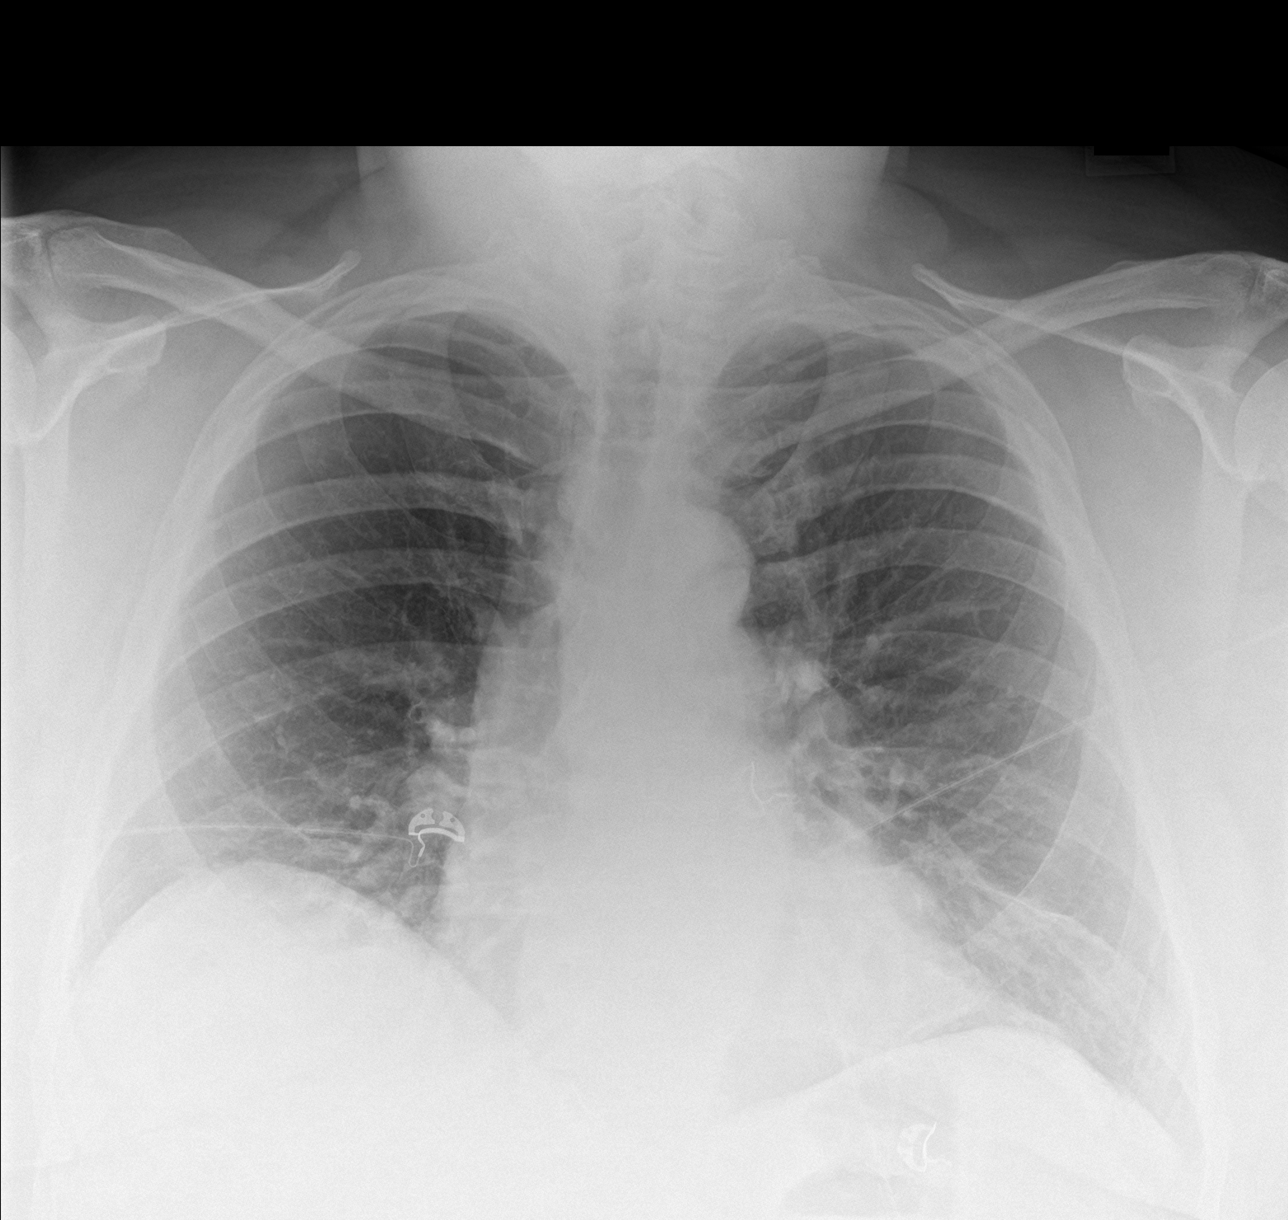

[3 of 3 positions shown; findings below may reference images not displayed]

FINDINGS: Cardiac silhouette is upper limits of normal in size, mediastinal
silhouette is nonsuspicious, mildly calcified aortic knob. Pulmonary
vascular congestion, mildly elevated RIGHT hemidiaphragm with
bibasilar strandy densities. No pleural effusion or focal
consolidation. No pneumothorax. Mild degenerative change of the
thoracic spine.
IMPRESSION: Borderline cardiomegaly and pulmonary vascular congestion.

Bibasilar atelectasis.

## 2019-02-20 ENCOUNTER — Ambulatory Visit (INDEPENDENT_AMBULATORY_CARE_PROVIDER_SITE_OTHER): Payer: Federal, State, Local not specified - PPO | Admitting: Family Medicine

## 2019-02-20 ENCOUNTER — Encounter: Payer: Self-pay | Admitting: Family Medicine

## 2019-02-20 ENCOUNTER — Other Ambulatory Visit: Payer: Self-pay

## 2019-02-20 VITALS — Ht 73.0 in

## 2019-02-20 DIAGNOSIS — I10 Essential (primary) hypertension: Secondary | ICD-10-CM

## 2019-02-20 DIAGNOSIS — E782 Mixed hyperlipidemia: Secondary | ICD-10-CM

## 2019-02-20 DIAGNOSIS — Z6841 Body Mass Index (BMI) 40.0 and over, adult: Secondary | ICD-10-CM

## 2019-02-20 DIAGNOSIS — E119 Type 2 diabetes mellitus without complications: Secondary | ICD-10-CM

## 2019-02-20 DIAGNOSIS — E559 Vitamin D deficiency, unspecified: Secondary | ICD-10-CM

## 2019-02-20 DIAGNOSIS — J4530 Mild persistent asthma, uncomplicated: Secondary | ICD-10-CM | POA: Diagnosis not present

## 2019-02-20 DIAGNOSIS — I700 Atherosclerosis of aorta: Secondary | ICD-10-CM

## 2019-02-20 MED ORDER — MONTELUKAST SODIUM 10 MG PO TABS: 10.0000 mg | ORAL_TABLET | Freq: Every day | ORAL | 3 refills | Status: DC

## 2019-02-20 MED ORDER — ASMANEX (60 METERED DOSES) 220 MCG/INH IN AEPB: 2.0000 | INHALATION_SPRAY | Freq: Every day | RESPIRATORY_TRACT | 0 refills | Status: DC

## 2019-02-20 MED ORDER — EZETIMIBE 10 MG PO TABS: 10.0000 mg | ORAL_TABLET | Freq: Every day | ORAL | 3 refills | Status: DC

## 2019-02-20 MED ORDER — HYDROCHLOROTHIAZIDE 25 MG PO TABS: 25.0000 mg | ORAL_TABLET | Freq: Every day | ORAL | 1 refills | Status: DC

## 2019-02-20 MED ORDER — LOSARTAN POTASSIUM 50 MG PO TABS: 50.0000 mg | ORAL_TABLET | Freq: Every day | ORAL | 1 refills | Status: DC

## 2019-02-20 NOTE — Progress Notes (Signed)
VIRTUAL VISIT VIA VIDEO  I connected with Adrian [name] on 02/20/19 at 10:00 AM EDT by a video enabled telemedicine application and verified that I am speaking with the correct person using two identifiers. Location patient: Home Location provider: [facility], Office Persons participating in the virtual visit: Patient, Dr. Raoul Pitch and R.Baker, LPN  I discussed the limitations of evaluation and management by telemedicine and the availability of in person appointments. The patient expressed understanding and agreed to proceed.  Patient ID: Adrian [name], male  DOB: 22-Apr-1961, 58 y.o.   MRN: [id number] Patient Care Team    Relationship Specialty Notifications Start End  Ma Hillock, DO PCP - General Family Medicine  02/06/18   Tanda Rockers, MD Consulting Physician Pulmonary Disease  02/07/18   Otelia Sergeant, OD Referring Physician   02/07/18   Lavena Bullion, DO Consulting Physician Gastroenterology  01/20/19     Chief Complaint  Patient presents with  . Diabetes    Pt is doing well with no complaints. Pt is unable to take vital signs.   . Hypertension    Subjective:  Adrian [name] is a 58 y.o.  male present for [facility]. Essential hypertension/hyperlipidemia/morbid obesity/CKD3 Pt reports compliance with losartan 50 mg QD and HCTZ 25 mg daily. Blood pressures ranges at home not routinely checked.  Patient denies chest pain, shortness of breath, dizziness or lower extremity edema.  Pt is not taking a daily baby ASA. Pt is not prescribed statin 2/2 intolerance x2. Started zeita 3 months ago.  BMP: 04/22/2018 WNL CBC: 04/22/2018 WNL Lipid: 04/2018 LDL 135 TSH: 01/2018 WNL Diet: low Salt Exercise: Routine exercise RF: Hypertension, hyperlipidemia, obesity, diabetes.   Type 2 diabetes mellitus without complication, without long-term current use of insulin (HCC) The metformin 500 mg daily, the afternoon dose is creating GI discomfort so he has stopped that since his  last appointment. Patient denies dizziness, hyperglycemic or hypoglycemic events. Patient denies numbness, tingling in the extremities or nonhealing wounds of feet.  Diabetes diagnosed 2019 PNA series: Pneumococcal 23 /2018-->prevnar completed 2019 Flu shot: completed (recommneded yearly) Microalbumin: on ARB Foot exam: 04/2018 UTD Eye exam:10/17/2017, Dr. Oswaldo Conroy,  [facility] A1c: 6.6 >>5.8>>>6.3>> a1c ordered  Asthma: Patient reports his asthma has been pretty stable.  He is taking the Singulair daily.  He rarely needs to use his Asmanex inhaler but does need a refill on that today.  Depression screen [facility] 2/9 09/25/2018 08/19/2018 04/23/2018 02/06/2018  Decreased Interest 0 0 0 0  Down, Depressed, Hopeless 0 0 0 0  PHQ - 2 Score 0 0 0 0   No flowsheet data found.     Fall Risk  08/19/2018 04/23/2018 02/06/2018  Falls in the past year? 0 No No    Immunization History  Administered Date(s) Administered  . Influenza Whole 04/22/2017  . Influenza,inj,Quad PF,6+ Mos 05/20/2015, 05/12/2018  . Influenza,inj,quad, With Preservative 05/20/2015  . Pneumococcal Conjugate-13 05/12/2018  . Pneumococcal Polysaccharide-23 02/02/2017    No exam data present  Past Medical History:  Diagnosis Date  . Adenomatous colon polyp    FS 02/2004; colo 2005 hyperplastic polyp; 2010 neg - butmucosal prolapse.   . Allergy   . Asthma   . Blood in stool   . Chicken pox   . Colon polyps   . Diabetes mellitus without complication ([place]) 00/1749  . Diverticulosis   . Frequent headaches   . Genital warts   . GERD (gastroesophageal reflux disease)   .  Hemorrhoid   . Hyperlipidemia   . Hypertension   . Hypogonadism in male    labs x2: 11/14/2015 - 216 and 11/17/2015 193; declined med at that time.   . Insomnia   . OSA on CPAP 09/18/2016   HST 09/18/2016; ESS-4; AHI-76; O2 min- 61%  . Pulmonary embolism ([place]) 01/2017   provoked by long care ride, on xarelto, est Dr. Melvyn Novas  . Thyroid disease     subclinical hypothyroidism per records   Allergies  Allergen Reactions  . Lisinopril Cough  . Statins Other (See Comments)    Causes pain/ foot trouble   Past Surgical History:  Procedure Laterality Date  . ANKLE SURGERY    . COLONOSCOPY  2016   dr Cristina Gong   . ESOPHAGOGASTRODUODENOSCOPY     with dr Cristina Gong    Family History  Problem Relation Age of Onset  . Asthma Mother   . Arthritis Mother   . Allergies Brother   . Alcohol abuse Maternal Grandmother   . Cancer Maternal Grandmother   . Hiatal hernia Maternal Grandfather   . Colon cancer Neg Hx   . Esophageal cancer Neg Hx    Social History   Socioeconomic History  . Marital status: Married    Spouse name: Not on file  . Number of children: 2  . Years of education: Not on file  . Highest education level: Not on file  Occupational History  . Not on file  Social Needs  . Financial resource strain: Not on file  . Food insecurity    Worry: Not on file    Inability: Not on file  . Transportation needs    Medical: Not on file    Non-medical: Not on file  Tobacco Use  . Smoking status: Never Smoker  . Smokeless tobacco: Never Used  Substance and Sexual Activity  . Alcohol use: Yes    Alcohol/week: 3.0 standard drinks    Types: 3 Glasses of wine per week  . Drug use: No  . Sexual activity: Yes    Partners: Female  Lifestyle  . Physical activity    Days per week: Not on file    Minutes per session: Not on file  . Stress: Not on file  Relationships  . Social Herbalist on phone: Not on file    Gets together: Not on file    Attends religious service: Not on file    Active member of club or organization: Not on file    Attends meetings of clubs or organizations: Not on file    Relationship status: Not on file  . Intimate partner violence    Fear of current or ex partner: Not on file    Emotionally abused: Not on file    Physically abused: Not on file    Forced sexual activity: Not on file  Other  Topics Concern  . Not on file  Social History Narrative   Marital status/children/pets: Married.   Education/employment: Associates degree, employed as a Armed forces operational officer:      -Wears a bicycle helmet riding a bike: No     -smoke alarm in the home:Yes     - wears seatbelt: Yes     - Feels safe in their relationships: Yes   Allergies as of 02/20/2019      Reactions   Lisinopril Cough   Statins Other (See Comments)   Causes pain/ foot trouble      Medication List  Accurate as of February 20, 2019 10:39 AM. If you have any questions, ask your nurse or doctor.        Asmanex (60 Metered Doses) 220 MCG/INH inhaler Generic drug: mometasone Inhale 2 puffs into the lungs daily.   cyclobenzaprine 10 MG tablet Commonly known as: FLEXERIL TAKE 1 TABLET BY MOUTH EVERYDAY AT BEDTIME PRN   ezetimibe 10 MG tablet Commonly known as: ZETIA Take 1 tablet (10 mg total) by mouth daily.   gabapentin 300 MG capsule Commonly known as: NEURONTIN TAKE 1 CAPSULE BY MOUTH EVERYDAY AT BEDTIME   hydrochlorothiazide 25 MG tablet Commonly known as: HYDRODIURIL Take 1 tablet (25 mg total) by mouth daily.   KLS Aller-Flo 50 MCG/ACT nasal spray Generic drug: fluticasone Place into both nostrils daily.   losartan 50 MG tablet Commonly known as: COZAAR Take 1 tablet (50 mg total) by mouth daily.   metFORMIN 500 MG tablet Commonly known as: GLUCOPHAGE Take 1 tablet (500 mg total) by mouth 2 (two) times daily with a meal. What changed: when to take this   montelukast 10 MG tablet Commonly known as: SINGULAIR Take 1 tablet (10 mg total) by mouth at bedtime.   omeprazole 40 MG capsule Commonly known as: PRILOSEC Take 1 capsule (40 mg total) by mouth daily.   rivaroxaban 10 MG Tabs tablet Commonly known as: Xarelto Take 1 tablet (10 mg total) by mouth daily.   UNABLE TO FIND Med Name: CPAP       All past medical history, surgical history, allergies, family history,  immunizations andmedications were updated in the EMR today and reviewed under the history and medication portions of their EMR.     No results found.   ROS: 14 pt review of systems performed and negative (unless mentioned in an HPI)  Objective: Ht 6\' 1"  (1.854 m)   BMI 47.93 kg/m  Gen: Afebrile. No acute distress.  HENT: AT. .  MMM.  Eyes:Pupils Equal Round Reactive to light, Extraocular movements intact,  Conjunctiva without redness, discharge or icterus. CV: no edema Chest: no cough or shortness of breath Neuro:  Normal gait. Alert. Oriented.  Assessment/plan: Adrian [name] is a 58 y.o. male present for tablets care Essential hypertension/hyperlipidemia/morbid obesity/Aortic atherosclerosis (HCC) - remains borderline.  - increase losartan to 50 mg.   - Continue HCTZ 25 mg daily.   - low sodium diet - Advised him to increase his exercise greater than 150 minutes a week.   - tolerating Zeita.  -Follow-up 4 mos (CPE).   Type 2 diabetes mellitus without complication, without long-term current use of insulin (HCC) - a1c and weight increased. Still good control with a1c < 6.5 Continue metformin 500 mg QD for now>>> may need to increase to 1000 mg QD (unable to tolerate BID/evening dose) Continue diet and exercise modifications  - continue gabapentin QHS PNA series: Pneumococcal 23 02/02/2017-->prevnar completed today Flu shot: completed today (recommneded yearly) Microalbumin: on ARB Foot exam: 05/12/18 UTD Eye exam:10/17/2017, Dr. Oswaldo Conroy,  [facility]- records requested and he was encouraged to schedule this year A1c: 6.6 >>5.8>>6.3>> ordered today F/u 4 months  Mild persistent asthma without complication - stable - montelukast (SINGULAIR) 10 MG tablet; Take 1 tablet (10 mg total) by mouth at bedtime.  Dispense: 90 tablet; Refill: 3 - mometasone (ASMANEX, 60 METERED DOSES,) 220 MCG/INH inhaler; Inhale 2 puffs into the lungs daily.  Dispense: 1 Inhaler; Refill: 0   Vitamin D deficiency Stable. Due for recheck. Level was 23 02/06/2018. - Vitamin  D (25 hydroxy); Future    Return in about 3 months (around 06/02/2019) for CPE (30 min).   > 25 minutes spent with patient, >50% of time spent face to face    Note is dictated utilizing voice recognition software. Although note has been proof read prior to signing, occasional typographical errors still can be missed. If any questions arise, please do not hesitate to call for verification.  Electronically signed by: Howard Pouch, DO [place]

## 2019-02-23 ENCOUNTER — Ambulatory Visit: Payer: Federal, State, Local not specified - PPO

## 2019-02-24 ENCOUNTER — Ambulatory Visit (INDEPENDENT_AMBULATORY_CARE_PROVIDER_SITE_OTHER): Payer: Federal, State, Local not specified - PPO

## 2019-02-24 ENCOUNTER — Other Ambulatory Visit: Payer: Self-pay

## 2019-02-24 VITALS — BP 138/74 | HR 69

## 2019-02-24 DIAGNOSIS — E119 Type 2 diabetes mellitus without complications: Secondary | ICD-10-CM | POA: Diagnosis not present

## 2019-02-24 DIAGNOSIS — I10 Essential (primary) hypertension: Secondary | ICD-10-CM

## 2019-02-24 DIAGNOSIS — E559 Vitamin D deficiency, unspecified: Secondary | ICD-10-CM | POA: Diagnosis not present

## 2019-02-24 MED ORDER — LOSARTAN POTASSIUM 50 MG PO TABS: 75.0000 mg | ORAL_TABLET | Freq: Every day | ORAL | 1 refills | Status: DC

## 2019-02-24 NOTE — Addendum Note (Signed)
Addended by: Howard Pouch A on: 02/24/2019 12:43 PM   Modules accepted: Orders, Level of Service

## 2019-02-24 NOTE — Progress Notes (Addendum)
Adrian [name] is a 58 y.o. male presents to the office today for Blood pressure recheck secondary to essential hypertension, recent virtual visit with PCP (07.31.2020).   Blood pressure medication: Losartan 50 mg daily and HCTZ 25 mg daily.  If on medication, Last dose was at least 1-2 hours prior to recheck: Yes Blood pressure was taken in the left arm after patient rested for 10 minutes.  BP 138/74 (BP Location: Left Arm, Patient Position: Sitting, Cuff Size: Large)   Pulse 69   SpO2 96%   [name] ______________________________________________________________  BP range still above goal>> increase losartan to 1.5 tabs daily (75 mg total). Continue the HCTZ.  I did call in additional refills since he will require more pills per month with 1.5 pills daily.   - we will call him when I get his labs back.   Electronically Signed by: Howard Pouch, DO  primary Care- OR    Per PCP, advised to increase losartan to 1.5 tab (75mg ) daily and continue HCTZ. Patient verbalized understanding.

## 2019-02-25 ENCOUNTER — Telehealth: Payer: Self-pay

## 2019-02-25 ENCOUNTER — Telehealth: Payer: Self-pay | Admitting: Family Medicine

## 2019-02-25 DIAGNOSIS — E119 Type 2 diabetes mellitus without complications: Secondary | ICD-10-CM

## 2019-02-25 LAB — HEMOGLOBIN A1C
Hgb A1c MFr Bld: 7.2 % of total Hgb — ABNORMAL HIGH (ref <5.7)
Mean Plasma Glucose: 160 (calc)
eAG (mmol/L): 8.9 (calc)

## 2019-02-25 LAB — VITAMIN D 25 HYDROXY (VIT D DEFICIENCY, FRACTURES): Vit D, 25-Hydroxy: 31 ng/mL (ref 30–100)

## 2019-02-25 MED ORDER — METFORMIN HCL 1000 MG PO TABS: 1000.0000 mg | ORAL_TABLET | Freq: Every day | ORAL | 1 refills | Status: DC

## 2019-02-25 NOTE — Telephone Encounter (Signed)
Please inform patient the following information: His a1c increased to 7.2. I have refilled his metformin at 1000 mg tab to take once in the morning. F/u 3 months needs scheduled as CPE

## 2019-02-25 NOTE — Telephone Encounter (Signed)
Opened in error

## 2019-02-25 NOTE — Telephone Encounter (Signed)
Patient notified of PCP recommendations and is agreement and expresses an understanding. Pt was scheduled for a CPE on 06/04/19.   [place] for [facility] to Discuss results / PCP recommendations / Schedule patient.

## 2019-02-25 NOTE — Telephone Encounter (Deleted)
Per PCP, advised patient to increase Losartan to 1.5 tablet (75mg ) daily and continue HCTZ. Patient verbalized understanding.

## 2019-03-04 DIAGNOSIS — G4733 Obstructive sleep apnea (adult) (pediatric): Secondary | ICD-10-CM | POA: Diagnosis not present

## 2019-03-18 ENCOUNTER — Other Ambulatory Visit: Payer: Self-pay

## 2019-03-18 DIAGNOSIS — J4530 Mild persistent asthma, uncomplicated: Secondary | ICD-10-CM

## 2019-03-18 MED ORDER — ASMANEX (60 METERED DOSES) 220 MCG/INH IN AEPB: 2.0000 | INHALATION_SPRAY | Freq: Every day | RESPIRATORY_TRACT | 0 refills | Status: DC

## 2019-03-18 NOTE — Progress Notes (Signed)
Received fax refill request for patients Asmanex. Pt was called and states he takes twice daily and will need refill on medication. Sent to pharmacy

## 2019-03-19 ENCOUNTER — Other Ambulatory Visit: Payer: Self-pay

## 2019-03-19 DIAGNOSIS — J4530 Mild persistent asthma, uncomplicated: Secondary | ICD-10-CM

## 2019-03-19 MED ORDER — ASMANEX (60 METERED DOSES) 220 MCG/INH IN AEPB: 2.0000 | INHALATION_SPRAY | Freq: Every day | RESPIRATORY_TRACT | 0 refills | Status: DC

## 2019-03-23 ENCOUNTER — Telehealth: Payer: Self-pay | Admitting: Family Medicine

## 2019-03-23 DIAGNOSIS — J4530 Mild persistent asthma, uncomplicated: Secondary | ICD-10-CM

## 2019-03-23 MED ORDER — ASMANEX (60 METERED DOSES) 220 MCG/INH IN AEPB: 2.0000 | INHALATION_SPRAY | Freq: Every day | RESPIRATORY_TRACT | 5 refills | Status: DC

## 2019-03-23 NOTE — Telephone Encounter (Signed)
Unfortunately, this is the first I have heard of the issue with his prescription. Of course it can be changed to x6 refills- which I have done today for him. There is no reason this medication has to be approved every month by this provider.

## 2019-03-23 NOTE — Telephone Encounter (Signed)
Called patient and spoke with him in detail about his experience attempting to get a refill on his inhaler last week. Patient says this is the second time in a row that it has happened to him with this particular rx.Marland Kitchen He is on an [facility] this medication. He submitted his refill request on Monday, August 24. Doesnt appear that our office received a request for the medication until Wednesday the 26th. At that time Wells Guiles called patient and confirmed that he does take this daily and it is a maintenance medication for him. Patient says that the pharmacy did not have the prescription on the 26th nor did they have it on the 27th. When he went to pick it up on the 28th and they still did not have it he came into the office frustrated. It is now Monday the 31st and because the medication had to be [facility] he still waiting on it. Do you agree that there should be a daily maintenance medication for him? If so, is it possible for him to receive five refills so that he does not have to go through this frustration every month and be without his medication for five days? If ok to add refills we can [facility] and update current rx.

## 2019-03-23 NOTE — Telephone Encounter (Signed)
-----   Message from [place] sent at 03/20/2019  2:20 PM EDT ----- Regarding: customer service Came into office today 8/28 very upset. He said that he wasn't sure where the break in communication is...but he intends on finding out.  Stated that this is the 2nd time he has had problems getting his RX filled from [place] and [facility]. He said that he called ASMANEX. Been on it for years.  [facility].  We are telling him we have sent it.   He would not leave today until someone (rebecca) personally called [facility] and gave them verbal orders.  She had already done it yesterday. Matter of fact she had sent twice electronically and spoke to [facility] yesterday to give verbal orders.  He left when I told him Wells Guiles just called them.   He is expecting your call on Monday. Phone number is correct in system

## 2019-03-24 NOTE — Telephone Encounter (Signed)
Spoke with Jenny Reichmann at [employer] at [facility].  John states they did not receive refill for Asmanex yesterday from Dr. Raoul Pitch.  Verbal order for refills (1 inhaler with 5 RF) given to pharmacist.  Patient notified.

## 2019-03-25 DIAGNOSIS — E119 Type 2 diabetes mellitus without complications: Secondary | ICD-10-CM | POA: Diagnosis not present

## 2019-03-25 DIAGNOSIS — H2513 Age-related nuclear cataract, bilateral: Secondary | ICD-10-CM | POA: Diagnosis not present

## 2019-03-25 DIAGNOSIS — Z7984 Long term (current) use of oral hypoglycemic drugs: Secondary | ICD-10-CM | POA: Diagnosis not present

## 2019-03-25 LAB — HM DIABETES EYE EXAM

## 2019-04-01 ENCOUNTER — Encounter: Payer: Self-pay | Admitting: Family Medicine

## 2019-04-07 ENCOUNTER — Other Ambulatory Visit: Payer: Self-pay

## 2019-04-07 MED ORDER — OMEPRAZOLE 40 MG PO CPDR: 40.0000 mg | DELAYED_RELEASE_CAPSULE | Freq: Every day | ORAL | 2 refills | Status: DC

## 2019-04-09 DIAGNOSIS — G4733 Obstructive sleep apnea (adult) (pediatric): Secondary | ICD-10-CM | POA: Diagnosis not present

## 2019-04-23 DIAGNOSIS — K760 Fatty (change of) liver, not elsewhere classified: Secondary | ICD-10-CM

## 2019-04-23 HISTORY — DX: Fatty (change of) liver, not elsewhere classified: K76.0

## 2019-06-04 ENCOUNTER — Ambulatory Visit (INDEPENDENT_AMBULATORY_CARE_PROVIDER_SITE_OTHER): Payer: Federal, State, Local not specified - PPO | Admitting: Family Medicine

## 2019-06-04 ENCOUNTER — Encounter: Payer: Self-pay | Admitting: Family Medicine

## 2019-06-04 ENCOUNTER — Other Ambulatory Visit: Payer: Self-pay

## 2019-06-04 VITALS — BP 141/83 | HR 61 | Temp 98.4°F | Resp 18 | Ht 72.5 in | Wt 359.1 lb

## 2019-06-04 DIAGNOSIS — Z23 Encounter for immunization: Secondary | ICD-10-CM | POA: Diagnosis not present

## 2019-06-04 DIAGNOSIS — Z0000 Encounter for general adult medical examination without abnormal findings: Secondary | ICD-10-CM

## 2019-06-04 DIAGNOSIS — E782 Mixed hyperlipidemia: Secondary | ICD-10-CM

## 2019-06-04 DIAGNOSIS — Z125 Encounter for screening for malignant neoplasm of prostate: Secondary | ICD-10-CM

## 2019-06-04 DIAGNOSIS — Z7901 Long term (current) use of anticoagulants: Secondary | ICD-10-CM

## 2019-06-04 DIAGNOSIS — E119 Type 2 diabetes mellitus without complications: Secondary | ICD-10-CM | POA: Diagnosis not present

## 2019-06-04 DIAGNOSIS — G8929 Other chronic pain: Secondary | ICD-10-CM

## 2019-06-04 DIAGNOSIS — J4530 Mild persistent asthma, uncomplicated: Secondary | ICD-10-CM

## 2019-06-04 DIAGNOSIS — M546 Pain in thoracic spine: Secondary | ICD-10-CM | POA: Diagnosis not present

## 2019-06-04 DIAGNOSIS — I700 Atherosclerosis of aorta: Secondary | ICD-10-CM

## 2019-06-04 DIAGNOSIS — I2699 Other pulmonary embolism without acute cor pulmonale: Secondary | ICD-10-CM

## 2019-06-04 DIAGNOSIS — I10 Essential (primary) hypertension: Secondary | ICD-10-CM | POA: Diagnosis not present

## 2019-06-04 DIAGNOSIS — Z1159 Encounter for screening for other viral diseases: Secondary | ICD-10-CM | POA: Diagnosis not present

## 2019-06-04 LAB — COMPREHENSIVE METABOLIC PANEL
ALT: 87 U/L — ABNORMAL HIGH (ref 0–53)
AST: 60 U/L — ABNORMAL HIGH (ref 0–37)
Albumin: 4.2 g/dL (ref 3.5–5.2)
Alkaline Phosphatase: 64 U/L (ref 39–117)
BUN: 21 mg/dL (ref 6–23)
CO2: 29 mEq/L (ref 19–32)
Calcium: 10 mg/dL (ref 8.4–10.5)
Chloride: 104 mEq/L (ref 96–112)
Creatinine, Ser: 1.02 mg/dL (ref 0.40–1.50)
GFR: 75.1 mL/min (ref >60.00)
Glucose, Bld: 145 mg/dL — ABNORMAL HIGH (ref 70–99)
Potassium: 4.3 mEq/L (ref 3.5–5.1)
Sodium: 139 mEq/L (ref 135–145)
Total Bilirubin: 0.4 mg/dL (ref 0.2–1.2)
Total Protein: 7.2 g/dL (ref 6.0–8.3)

## 2019-06-04 LAB — CBC
HCT: 46.6 % (ref 39.0–52.0)
Hemoglobin: 15.3 g/dL (ref 13.0–17.0)
MCHC: 32.9 g/dL (ref 30.0–36.0)
MCV: 89.4 fl (ref 78.0–100.0)
Platelets: 250 10*3/uL (ref 150.0–400.0)
RBC: 5.21 Mil/uL (ref 4.22–5.81)
RDW: 13.9 % (ref 11.5–15.5)
WBC: 7.7 10*3/uL (ref 4.0–10.5)

## 2019-06-04 LAB — HEMOGLOBIN A1C: Hgb A1c MFr Bld: 7 % — ABNORMAL HIGH (ref 4.6–6.5)

## 2019-06-04 LAB — LIPID PANEL
Cholesterol: 202 mg/dL — ABNORMAL HIGH (ref 0–200)
HDL: 40.3 mg/dL (ref >39.00)
LDL Cholesterol: 138 mg/dL — ABNORMAL HIGH (ref 0–99)
NonHDL: 161.76
Total CHOL/HDL Ratio: 5
Triglycerides: 117 mg/dL (ref 0.0–149.0)
VLDL: 23.4 mg/dL (ref 0.0–40.0)

## 2019-06-04 LAB — TSH: TSH: 2.72 u[IU]/mL (ref 0.35–4.50)

## 2019-06-04 LAB — PSA: PSA: 0.91 ng/mL (ref 0.10–4.00)

## 2019-06-04 MED ORDER — METFORMIN HCL 1000 MG PO TABS: 1000.0000 mg | ORAL_TABLET | Freq: Every day | ORAL | 1 refills | Status: DC

## 2019-06-04 MED ORDER — CYCLOBENZAPRINE HCL 10 MG PO TABS: ORAL_TABLET | ORAL | 5 refills | Status: DC

## 2019-06-04 MED ORDER — GABAPENTIN 300 MG PO CAPS: ORAL_CAPSULE | ORAL | 2 refills | Status: DC

## 2019-06-04 MED ORDER — LOSARTAN POTASSIUM 50 MG PO TABS: 75.0000 mg | ORAL_TABLET | Freq: Every day | ORAL | 1 refills | Status: DC

## 2019-06-04 MED ORDER — OMEPRAZOLE 40 MG PO CPDR: 40.0000 mg | DELAYED_RELEASE_CAPSULE | Freq: Every day | ORAL | 5 refills | Status: DC

## 2019-06-04 MED ORDER — EZETIMIBE 10 MG PO TABS: 10.0000 mg | ORAL_TABLET | Freq: Every day | ORAL | 3 refills | Status: DC

## 2019-06-04 MED ORDER — HYDROCHLOROTHIAZIDE 25 MG PO TABS: 25.0000 mg | ORAL_TABLET | Freq: Every day | ORAL | 1 refills | Status: DC

## 2019-06-04 NOTE — Patient Instructions (Addendum)
Follow up: every 4 mos for chronic conditions and yearly for physical.   Health Maintenance, Male Adopting a healthy lifestyle and getting preventive care are important in promoting health and wellness. Ask your health care provider about:  The right schedule for you to have regular tests and exams.  Things you can do on your own to prevent diseases and keep yourself healthy. What should I know about diet, weight, and exercise? Eat a healthy diet   Eat a diet that includes plenty of vegetables, fruits, low-fat dairy products, and lean protein.  Do not eat a lot of foods that are high in solid fats, added sugars, or sodium. Maintain a healthy weight Body mass index (BMI) is a measurement that can be used to identify possible weight problems. It estimates body fat based on height and weight. Your health care provider can help determine your BMI and help you achieve or maintain a healthy weight. Get regular exercise Get regular exercise. This is one of the most important things you can do for your health. Most adults should:  Exercise for at least 150 minutes each week. The exercise should increase your heart rate and make you sweat (moderate-intensity exercise).  Do strengthening exercises at least twice a week. This is in addition to the moderate-intensity exercise.  Spend less time sitting. Even light physical activity can be beneficial. Watch cholesterol and blood lipids Have your blood tested for lipids and cholesterol at 57 years of age, then have this test every 5 years. You may need to have your cholesterol levels checked more often if:  Your lipid or cholesterol levels are high.  You are older than 57 years of age.  You are at high risk for heart disease. What should I know about cancer screening? Many types of cancers can be detected early and may often be prevented. Depending on your health history and family history, you may need to have cancer screening at various ages.  This may include screening for:  Colorectal cancer.  Prostate cancer.  Skin cancer.  Lung cancer. What should I know about heart disease, diabetes, and high blood pressure? Blood pressure and heart disease  High blood pressure causes heart disease and increases the risk of stroke. This is more likely to develop in people who have high blood pressure readings, are of African descent, or are overweight.  Talk with your health care provider about your target blood pressure readings.  Have your blood pressure checked: ? Every 3-5 years if you are 57-66 years of age. ? Every year if you are 57 years old or older.  If you are between the ages of 80 and 55 and are a current or former smoker, ask your health care provider if you should have a one-time screening for abdominal aortic aneurysm (AAA). Diabetes Have regular diabetes screenings. This checks your fasting blood sugar level. Have the screening done:  Once every three years after age 57 if you are at a normal weight and have a low risk for diabetes.  More often and at a younger age if you are overweight or have a high risk for diabetes. What should I know about preventing infection? Hepatitis B If you have a higher risk for hepatitis B, you should be screened for this virus. Talk with your health care provider to find out if you are at risk for hepatitis B infection. Hepatitis C Blood testing is recommended for:  Everyone born from [birth year] through 1964.  Anyone with known risk factors  for hepatitis C. Sexually transmitted infections (STIs)  You should be screened each year for STIs, including gonorrhea and chlamydia, if: ? You are sexually active and are younger than 57 years of age. ? You are older than 57 years of age and your health care provider tells you that you are at risk for this type of infection. ? Your sexual activity has changed since you were last screened, and you are at increased risk for chlamydia or gonorrhea.  Ask your health care provider if you are at risk.  Ask your health care provider about whether you are at high risk for HIV. Your health care provider may recommend a prescription medicine to help prevent HIV infection. If you choose to take medicine to prevent HIV, you should first get tested for HIV. You should then be tested every 3 months for as long as you are taking the medicine. Follow these instructions at home: Lifestyle  Do not use any products that contain nicotine or tobacco, such as cigarettes, e-cigarettes, and chewing tobacco. If you need help quitting, ask your health care provider.  Do not use street drugs.  Do not share needles.  Ask your health care provider for help if you need support or information about quitting drugs. Alcohol use  Do not drink alcohol if your health care provider tells you not to drink.  If you drink alcohol: ? Limit how much you have to 0-2 drinks a day. ? Be aware of how much alcohol is in your drink. In the [place] one drink equals one 12 oz bottle of beer (355 mL), one 5 oz glass of wine (148 mL), or one 1 oz glass of hard liquor (44 mL). General instructions  Schedule regular health, dental, and eye exams.  Stay current with your vaccines.  Tell your health care provider if: ? You often feel depressed. ? You have ever been abused or do not feel safe at home. Summary  Adopting a healthy lifestyle and getting preventive care are important in promoting health and wellness.  Follow your health care provider's instructions about healthy diet, exercising, and getting tested or screened for diseases.  Follow your health care provider's instructions on monitoring your cholesterol and blood pressure. This information is not intended to replace advice given to you by your health care provider. Make sure you discuss any questions you have with your health care provider. Document Released: 01/05/2008 Document Revised: 07/02/2018 Document Reviewed:  07/02/2018 Elsevier Patient Education  2020 [employer].

## 2019-06-04 NOTE — Progress Notes (Signed)
Patient ID: Adrian [name], male  DOB: January 10, 1961, 58 y.o.   MRN: [id number] Patient Care Team    Relationship Specialty Notifications Start End  Ma Hillock, DO PCP - General Family Medicine  02/06/18   Tanda Rockers, MD Consulting Physician Pulmonary Disease  02/07/18   Otelia Sergeant, OD Referring Physician   02/07/18   Lavena Bullion, DO Consulting Physician Gastroenterology  01/20/19     Chief Complaint  Patient presents with  . Annual Exam    Fasting. Pt took BP about ten mins ago.     Subjective:  Adrian [name] is a 58 y.o. male present for CPE. All past medical history, surgical history, allergies, family history, immunizations, medications and social history were updated in the electronic medical record today. All recent labs, ED visits and hospitalizations within the last year were reviewed.  Health maintenance:  Colonoscopy: last screen 2016 by Dr. Bryan Lemma, recommend follow up 5 years Immunizations:  tdap overdue-completed today, influenza UTD 2020, PNA series completed 2019, Shingrix No. 1 provided today Infectious disease screening: HIV completed 2018.  Hep C completed today, patient agreeable.  PSA:  Lab Results  Component Value Date   PSA 0.91 06/04/2019  , pt was counseled on prostate cancer screenings.  Screening completed today Assistive device: none Oxygen ZNB:VAPO Patient has a Dental home. Hospitalizations/ED visits: reviewed  Essential hypertension/hyperlipidemia/morbid obesity/CKD3 Pt reports compliance with losartan 75 mg QD and HCTZ 25 mg daily. Blood pressures ranges at home normal range.  He just took his medications before entering the door today. Patient denies chest pain, shortness of breath, dizziness or lower extremity edema.  Pt is not taking a daily baby ASA. Pt is not prescribed statin 2/2 intolerance x2. Started zeita and tolerating. BMP: 04/22/2018 WNL CBC: 04/22/2018 WNL Lipid: 04/2018 LDL 135 TSH: 01/2018 WNL Diet: low Salt  Exercise: Routine exercise RF: Hypertension, hyperlipidemia, obesity, diabetes.   Type 2 diabetes mellitus without complication, without long-term current use of insulin (Adrian) Patient reports compliance with metformin 1000 mg daily daily.. Patient denies dizziness, hyperglycemic or hypoglycemic events. Patient denies dizziness, hyperglycemic or hypoglycemic events. Patient denies numbness, tingling in the extremities or nonhealing wounds of feet.  He is prescribed gabapentin  300 mg nightly and reports it is very helpful-mostly for back/leg pain. Diabetes diagnosed 2019 PNA series: Pneumococcal 23 /2018-->prevnar completed 2019 Flu shot: completed (recommneded yearly) Microalbumin: on ARB Foot exam: 04/2018 UTD Eye exam:10/17/2017, Dr. Oswaldo Conroy,  [facility] A1c: 6.6 >>5.8>>>6.3>> a1c ordered  Asthma: Patient reports his asthma has been pretty stable.  He has not needed the Asmanex for some time.   Depression screen [facility] 2/9 06/04/2019 09/25/2018 08/19/2018 04/23/2018 02/06/2018  Decreased Interest 0 0 0 0 0  Down, Depressed, Hopeless 0 0 0 0 0  PHQ - 2 Score 0 0 0 0 0   No flowsheet data found.       Fall Risk  08/19/2018 04/23/2018 02/06/2018  Falls in the past year? 0 No No      Immunization History  Administered Date(s) Administered  . Influenza Whole 04/22/2017  . Influenza,inj,Quad PF,6+ Mos 05/20/2015, 05/12/2018, 05/15/2019  . Influenza,inj,quad, With Preservative 05/20/2015  . Pneumococcal Conjugate-13 05/12/2018  . Pneumococcal Polysaccharide-23 02/02/2017  . Tdap 06/04/2019  . Zoster Recombinat (Shingrix) 06/04/2019     Past Medical History:  Diagnosis Date  . Adenomatous colon polyp    FS 02/2004; colo 2005 hyperplastic polyp; 2010 neg - butmucosal prolapse.   Marland Kitchen  Allergy   . Asthma   . Blood in stool   . Chicken pox   . Colon polyps   . Diabetes mellitus without complication ([place]) 54/6270  . Diverticulosis   . Frequent headaches   . Genital warts   . GERD  (gastroesophageal reflux disease)   . Hemorrhoid   . Hyperlipidemia   . Hypertension   . Hypogonadism in male    labs x2: 11/14/2015 - 216 and 11/17/2015 193; declined med at that time.   . Insomnia   . OSA on CPAP 09/18/2016   HST 09/18/2016; ESS-4; AHI-76; O2 min- 61%  . Pulmonary embolism ([place]) 01/2017   provoked by long care ride, on xarelto, est Dr. Melvyn Novas  . Thyroid disease    subclinical hypothyroidism per records   Allergies  Allergen Reactions  . Lisinopril Cough  . Statins Other (See Comments)    Causes pain/ foot trouble   Past Surgical History:  Procedure Laterality Date  . ANKLE SURGERY    . COLONOSCOPY  2016   dr Cristina Gong   . ESOPHAGOGASTRODUODENOSCOPY     with dr Cristina Gong    Family History  Problem Relation Age of Onset  . Asthma Mother   . Arthritis Mother   . Allergies Brother   . Alcohol abuse Maternal Grandmother   . Cancer Maternal Grandmother   . Hiatal hernia Maternal Grandfather   . Colon cancer Neg Hx   . Esophageal cancer Neg Hx    Social History   Social History Narrative   Marital status/children/pets: Married.   Education/employment: Associates degree, employed as a Armed forces operational officer:      -Wears a bicycle helmet riding a bike: No     -smoke alarm in the home:Yes     - wears seatbelt: Yes     - Feels safe in their relationships: Yes    Allergies as of 06/04/2019      Reactions   Lisinopril Cough   Statins Other (See Comments)   Causes pain/ foot trouble      Medication List       Accurate as of June 04, 2019 11:59 PM. If you have any questions, ask your nurse or doctor.        Asmanex (60 Metered Doses) 220 MCG/INH inhaler Generic drug: mometasone Inhale 2 puffs into the lungs daily.   cyclobenzaprine 10 MG tablet Commonly known as: FLEXERIL TAKE 1 TABLET BY MOUTH EVERYDAY AT BEDTIME PRN   ezetimibe 10 MG tablet Commonly known as: ZETIA Take 1 tablet (10 mg total) by mouth daily.   gabapentin 300 MG capsule  Commonly known as: NEURONTIN TAKE 1 CAPSULE BY MOUTH EVERYDAY AT BEDTIME   hydrochlorothiazide 25 MG tablet Commonly known as: HYDRODIURIL Take 1 tablet (25 mg total) by mouth daily.   KLS Aller-Flo 50 MCG/ACT nasal spray Generic drug: fluticasone Place into both nostrils daily.   losartan 50 MG tablet Commonly known as: COZAAR Take 1.5 tablets (75 mg total) by mouth daily.   metFORMIN 1000 MG tablet Commonly known as: GLUCOPHAGE Take 1 tablet (1,000 mg total) by mouth daily with breakfast.   montelukast 10 MG tablet Commonly known as: SINGULAIR Take 1 tablet (10 mg total) by mouth at bedtime.   omeprazole 40 MG capsule Commonly known as: PRILOSEC Take 1 capsule (40 mg total) by mouth daily.   rivaroxaban 10 MG Tabs tablet Commonly known as: Xarelto Take 1 tablet (10 mg total) by mouth daily.   UNABLE TO FIND  Med Name: CPAP      All past medical history, surgical history, allergies, family history, immunizations andmedications were updated in the EMR today and reviewed under the history and medication portions of their EMR.     Recent Results (from the past 2160 hour(s))  HM DIABETES EYE EXAM     Status: None   Collection Time: 03/25/19 12:00 AM  Result Value Ref Range   HM Diabetic Eye Exam No Retinopathy No Retinopathy  CBC     Status: None   Collection Time: 06/04/19  9:40 AM  Result Value Ref Range   WBC 7.7 4.0 - 10.5 K/uL   RBC 5.21 4.22 - 5.81 Mil/uL   Platelets 250.0 150.0 - 400.0 K/uL   Hemoglobin 15.3 13.0 - 17.0 g/dL   HCT 46.6 39.0 - 52.0 %   MCV 89.4 78.0 - 100.0 fl   MCHC 32.9 30.0 - 36.0 g/dL   RDW 13.9 11.5 - 15.5 %  Comp Met (CMET)     Status: Abnormal   Collection Time: 06/04/19  9:40 AM  Result Value Ref Range   Sodium 139 135 - 145 mEq/L   Potassium 4.3 3.5 - 5.1 mEq/L   Chloride 104 96 - 112 mEq/L   CO2 29 19 - 32 mEq/L   Glucose, Bld 145 (H) 70 - 99 mg/dL   BUN 21 6 - 23 mg/dL   Creatinine, Ser 1.02 0.40 - 1.50 mg/dL   Total  Bilirubin 0.4 0.2 - 1.2 mg/dL   Alkaline Phosphatase 64 39 - 117 U/L   AST 60 (H) 0 - 37 U/L   ALT 87 (H) 0 - 53 U/L   Total Protein 7.2 6.0 - 8.3 g/dL   Albumin 4.2 3.5 - 5.2 g/dL   GFR 75.10 >60.00 mL/min   Calcium 10.0 8.4 - 10.5 mg/dL  HgB A1c     Status: Abnormal   Collection Time: 06/04/19  9:40 AM  Result Value Ref Range   Hgb A1c MFr Bld 7.0 (H) 4.6 - 6.5 %    Comment: Glycemic Control Guidelines for People with Diabetes:Non Diabetic:  <6%Goal of Therapy: <7%Additional Action Suggested:  >8%   Lipid panel     Status: Abnormal   Collection Time: 06/04/19  9:40 AM  Result Value Ref Range   Cholesterol 202 (H) 0 - 200 mg/dL    Comment: ATP III Classification       Desirable:  < 200 mg/dL               Borderline High:  200 - 239 mg/dL          High:  > = 240 mg/dL   Triglycerides 117.0 0.0 - 149.0 mg/dL    Comment: Normal:  <150 mg/dLBorderline High:  150 - 199 mg/dL   HDL 40.30 >39.00 mg/dL   VLDL 23.4 0.0 - 40.0 mg/dL   LDL Cholesterol 138 (H) 0 - 99 mg/dL   Total CHOL/HDL Ratio 5     Comment:                Men          Women1/2 Average Risk     3.4          3.3Average Risk          5.0          4.42X Average Risk          9.6          7.13X Average  Risk          15.0          11.0                       NonHDL 161.76     Comment: NOTE:  Non-HDL goal should be 30 mg/dL higher than patient's LDL goal (i.e. LDL goal of < 70 mg/dL, would have non-HDL goal of < 100 mg/dL)  TSH     Status: None   Collection Time: 06/04/19  9:40 AM  Result Value Ref Range   TSH 2.72 0.35 - 4.50 uIU/mL  PSA     Status: None   Collection Time: 06/04/19  9:40 AM  Result Value Ref Range   PSA 0.91 0.10 - 4.00 ng/mL    Comment: Test performed using Access Hybritech PSA Assay, a parmagnetic partical, chemiluminecent immunoassay.  Hepatitis C Antibody     Status: None   Collection Time: 06/04/19  9:40 AM  Result Value Ref Range   Hepatitis C Ab NON-REACTIVE NON-REACTI   SIGNAL TO CUT-OFF 0.03 <1.00     Comment: . HCV antibody was non-reactive. There is no laboratory  evidence of HCV infection. . In most cases, no further action is required. However, if recent HCV exposure is suspected, a test for HCV RNA (test code [phone]) is suggested. . For additional information please refer to http://education.questdiagnostics.com/faq/FAQ22v1 (This link is being provided for informational/ educational purposes only.) .      ROS: 14 pt review of systems performed and negative (unless mentioned in an HPI)  Objective: BP (!) 141/83 (BP Location: Right Arm, Patient Position: Sitting, Cuff Size: Large)   Pulse 61   Temp 98.4 F (36.9 C) (Temporal)   Resp 18   Ht 6' 0.5" (1.842 m)   Wt (!) 359 lb 2 oz (162.9 kg)   SpO2 93%   BMI 48.04 kg/m  Gen: Afebrile. No acute distress. Nontoxic in appearance, well-developed, well-nourished, very pleasant, obese Caucasian male. HENT: AT. [place]. Bilateral TM visualized and normal in appearance, normal external auditory canal. MMM, no oral lesions, adequate dentition. Bilateral nares within normal limits. Throat without erythema, ulcerations or exudates.  No cough on exam, no hoarseness on exam. Eyes:Pupils Equal Round Reactive to light, Extraocular movements intact,  Conjunctiva without redness, discharge or icterus. Neck/lymp/endocrine: Supple, no lymphadenopathy, no thyromegaly CV: RRR no murmur, no edema, +2/4 P posterior tibialis pulses.  No carotid bruits. No JVD. Chest: CTAB, no wheeze, rhonchi or crackles.  Normal respiratory effort.  Good air movement. Abd: Soft.  Obese. NTND. BS present.  No masses palpated. No hepatosplenomegaly. No rebound tenderness or guarding. Skin: No rashes, purpura or petechiae. Warm and well-perfused. Skin intact. Neuro/Msk:  Normal gait. PERLA. EOMi. Alert. Oriented x3.  Cranial nerves II through XII intact. Muscle strength 5/5 upper/lower extremity. DTRs equal bilaterally. Psych: Normal affect, dress and demeanor. Normal  speech. Normal thought content and judgment. Diabetic Foot Exam - Simple   Simple Foot Form Diabetic Foot exam was performed with the following findings: Yes 06/04/2019  6:45 PM  Visual Inspection No deformities, no ulcerations, no other skin breakdown bilaterally: Yes Sensation Testing Intact to touch and monofilament testing bilaterally: Yes Pulse Check Posterior Tibialis and Dorsalis pulse intact bilaterally: Yes Comments      No exam data present  Assessment/plan: ARHAN [name] is a 58 y.o. male present for CPE Essential hypertension/hyperlipidemia/morbid obesity/Aortic atherosclerosis (HCC)/chronic anticoag/PE -Stable.  Patient just took medications today.  Continue losartan 75 mg daily and HCTZ 25 mg daily.  Refills provided for him today. -Continue Xarelto (for PE) - low sodium diet - Advised him to increase his exercise greater than 150 minutes a week.   - tolerating Zeita-continue.  Refills provided today -Follow-up 4 mos   Type 2 diabetes mellitus without complication, without long-term current use of insulin (HCC) -A1c collected today with labs. Continue metformin 1000 mg daily. Continue diet and exercise modifications  - continue gabapentin QHS 300 mg -refills provided for him today on this and Flexeril for his back discomfort. PNA series: Pneumonia series completed Flu shot: Up-to-date (recommneded yearly) Microalbumin: on ARB Foot exam: Completed today 05/2019 Eye exam: Up-to-date 03/2019 Dr. Oswaldo Conroy,  [facility]- records requested and he was encouraged to schedule this year A1c: 6.6 >>5.8>>6.3>> ordered today F/u 4 months  Mild persistent asthma without complication -Stable.  Currently not needing the Asmanex.  Taking the Singulair daily.  No further refills needed today  Chronic thoracic back pain: Improved.  Continue gabapentin nightly and Flexeril nightly as needed.  Refills provided on both medications today.  Encounter for preventative health  examination Patient was encouraged to exercise greater than 150 minutes a week. Patient was encouraged to choose a diet filled with fresh fruits and vegetables, and lean meats. AVS provided to patient today for education/recommendation on gender specific health and safety maintenance. Colonoscopy: last screen 2016 by Dr. Bryan Lemma, recommend follow up 5 years Immunizations:  tdap overdue-completed today, influenza UTD 2020, PNA series completed 2019, Shingrix No. 1 provided today Infectious disease screening: HIV completed 2018.  Hep C completed today, patient agreeable.  PSA: No results found for: PSA, pt was counseled on prostate cancer screenings.  Screening completed today  Annual physical plus > > 25 minutes spent with patient, >50% of time spent face to face   Return in about 1 year (around 06/03/2020) for CPE (30 min).  and 4 months for chronic medical condition follow-up.  Note is dictated utilizing voice recognition software. Although note has been proof read prior to signing, occasional typographical errors still can be missed. If any questions arise, please do not hesitate to call for verification.  Electronically signed by: Howard Pouch, DO [place]

## 2019-06-05 LAB — HEPATITIS C ANTIBODY
Hepatitis C Ab: NONREACTIVE
SIGNAL TO CUT-OFF: 0.03 (ref <1.00)

## 2019-06-08 ENCOUNTER — Telehealth: Payer: Self-pay | Admitting: Family Medicine

## 2019-06-08 ENCOUNTER — Encounter: Payer: Self-pay | Admitting: Family Medicine

## 2019-06-08 DIAGNOSIS — G8929 Other chronic pain: Secondary | ICD-10-CM | POA: Insufficient documentation

## 2019-06-08 DIAGNOSIS — M546 Pain in thoracic spine: Secondary | ICD-10-CM | POA: Insufficient documentation

## 2019-06-08 DIAGNOSIS — R748 Abnormal levels of other serum enzymes: Secondary | ICD-10-CM | POA: Insufficient documentation

## 2019-06-08 NOTE — Telephone Encounter (Addendum)
Please inform patient the following information: His kidneys, electrolytes, cell count, thyroid panel and prostate cancer screening are all within normal range. His cholesterol levels are almost exactly the same as last year with a total cholesterol of 202, HDL 40 and LDL of 138, triglycerides 117. A1c has improved, was 7.2 now 7.0. His liver enzymes have increased and are abnormal.  This is new for him.  Avoid any Tylenol use- try to avoid any flexeril use as well- and follow up in 1 month for retest.    -Liver enzymes can fluctuate with starting medication uses, any alcohol use, recent illnesses/viral illnesses or can be an indication of liver/gallbladder issues.  Since he is not have any symptoms, will repeat enzymes in 3-4 weeks if not returning to normal, further work-up will be indicated at that time.  -Lab appointment okay in 3-4 weeks, if abnormal will then need to see in person to discuss further work-up.

## 2019-06-08 NOTE — Telephone Encounter (Signed)
Called patient and he voiced understanding of new plan. Made lab appointment for patient

## 2019-07-08 ENCOUNTER — Ambulatory Visit (INDEPENDENT_AMBULATORY_CARE_PROVIDER_SITE_OTHER): Payer: Federal, State, Local not specified - PPO | Admitting: Family Medicine

## 2019-07-08 ENCOUNTER — Other Ambulatory Visit: Payer: Self-pay

## 2019-07-08 DIAGNOSIS — R748 Abnormal levels of other serum enzymes: Secondary | ICD-10-CM | POA: Diagnosis not present

## 2019-07-08 DIAGNOSIS — G4733 Obstructive sleep apnea (adult) (pediatric): Secondary | ICD-10-CM | POA: Diagnosis not present

## 2019-07-08 LAB — HEPATIC FUNCTION PANEL
ALT: 81 U/L — ABNORMAL HIGH (ref 0–53)
AST: 42 U/L — ABNORMAL HIGH (ref 0–37)
Albumin: 4 g/dL (ref 3.5–5.2)
Alkaline Phosphatase: 66 U/L (ref 39–117)
Bilirubin, Direct: 0.1 mg/dL (ref 0.0–0.3)
Total Bilirubin: 0.4 mg/dL (ref 0.2–1.2)
Total Protein: 7.1 g/dL (ref 6.0–8.3)

## 2019-07-10 ENCOUNTER — Other Ambulatory Visit: Payer: Self-pay

## 2019-07-10 ENCOUNTER — Encounter: Payer: Self-pay | Admitting: Family Medicine

## 2019-07-10 ENCOUNTER — Ambulatory Visit: Payer: Federal, State, Local not specified - PPO | Admitting: Family Medicine

## 2019-07-10 VITALS — BP 146/80 | HR 70 | Temp 97.7°F | Resp 17 | Ht 73.0 in | Wt 363.5 lb

## 2019-07-10 DIAGNOSIS — R7989 Other specified abnormal findings of blood chemistry: Secondary | ICD-10-CM | POA: Diagnosis not present

## 2019-07-10 DIAGNOSIS — I10 Essential (primary) hypertension: Secondary | ICD-10-CM | POA: Diagnosis not present

## 2019-07-10 DIAGNOSIS — R748 Abnormal levels of other serum enzymes: Secondary | ICD-10-CM

## 2019-07-10 DIAGNOSIS — K760 Fatty (change of) liver, not elsewhere classified: Secondary | ICD-10-CM | POA: Diagnosis not present

## 2019-07-10 MED ORDER — LOSARTAN POTASSIUM 100 MG PO TABS: 100.0000 mg | ORAL_TABLET | Freq: Every day | ORAL | 1 refills | Status: DC

## 2019-07-10 NOTE — Progress Notes (Signed)
This visit occurred during the SARS-CoV-2 public health emergency.  Safety protocols were in place, including screening questions prior to the visit, additional usage of staff PPE, and extensive cleaning of exam room while observing appropriate contact time as indicated for disinfecting solutions.    Adrian [name] , 30-Jun-1961, 58 y.o., male MRN: [id number] Patient Care Team    Relationship Specialty Notifications Start End  Ma Hillock, DO PCP - General Family Medicine  02/06/18   Tanda Rockers, MD Consulting Physician Pulmonary Disease  02/07/18   Otelia Sergeant, OD Referring Physician   02/07/18   Lavena Bullion, DO Consulting Physician Gastroenterology  01/20/19     Chief Complaint  Patient presents with  . Follow-up    Discuss labs Pt took BP meds at 0845.      Subjective: Pt presents for an OV to follow-up on his elevated liver enzymes found to be elevated on his routine labs for chronic conditions approximately 4-5 weeks ago.  Repeat enzymes are again elevated although AST decreased very mildly.  ALT 87 down to 81.  AST 60 down to 42.  Patient has not had elevated liver enzymes in the past.  Over the last 4 weeks he has avoided all Tylenol and has not used his Flexeril.  He also has avoided alcohol use.  He states he sometimes has a few glasses of wine a week, usually no more than 1 bottle wine a week.  He had a CT November 2019 which showed mild hepatic steatosis only.  His liver enzymes at that time were normal.  HIV was negative 2018.  Hepatitis C antibody was negative 2019.  Patient is asymptomatic currently.  But has had some flank and back pain on the right side intermittently over the last year.  Depression screen [facility] 2/9 06/04/2019 09/25/2018 08/19/2018 04/23/2018 02/06/2018  Decreased Interest 0 0 0 0 0  Down, Depressed, Hopeless 0 0 0 0 0  PHQ - 2 Score 0 0 0 0 0    Allergies  Allergen Reactions  . Lisinopril Cough  . Statins Other (See Comments)    Causes  pain/ foot trouble   Social History   Social History Narrative   Marital status/children/pets: Married.   Education/employment: Associates degree, employed as a Armed forces operational officer:      -Wears a bicycle helmet riding a bike: No     -smoke alarm in the home:Yes     - wears seatbelt: Yes     - Feels safe in their relationships: Yes   Past Medical History:  Diagnosis Date  . Adenomatous colon polyp    FS 02/2004; colo 2005 hyperplastic polyp; 2010 neg - butmucosal prolapse.   . Allergy   . Asthma   . Blood in stool   . Chicken pox   . Colon polyps   . Diabetes mellitus without complication ([place]) 99991111  . Diverticulosis   . Frequent headaches   . Genital warts   . GERD (gastroesophageal reflux disease)   . Hemorrhoid   . Hyperlipidemia   . Hypertension   . Hypogonadism in male    labs x2: 11/14/2015 - 216 and 11/17/2015 193; declined med at that time.   . Insomnia   . OSA on CPAP 09/18/2016   HST 09/18/2016; ESS-4; AHI-76; O2 min- 61%  . Pulmonary embolism ([place]) 01/2017   provoked by long care ride, on xarelto, est Dr. Melvyn Novas  . Thyroid disease    subclinical  hypothyroidism per records   Past Surgical History:  Procedure Laterality Date  . ANKLE SURGERY    . COLONOSCOPY  2016   dr Cristina Gong   . ESOPHAGOGASTRODUODENOSCOPY     with dr Cristina Gong    Family History  Problem Relation Age of Onset  . Asthma Mother   . Arthritis Mother   . Allergies Brother   . Alcohol abuse Maternal Grandmother   . Cancer Maternal Grandmother   . Hiatal hernia Maternal Grandfather   . Colon cancer Neg Hx   . Esophageal cancer Neg Hx    Allergies as of 07/10/2019      Reactions   Lisinopril Cough   Statins Other (See Comments)   Causes pain/ foot trouble      Medication List       Accurate as of July 10, 2019 10:12 AM. If you have any questions, ask your nurse or doctor.        Asmanex (60 Metered Doses) 220 MCG/INH inhaler Generic drug: mometasone Inhale 2 puffs  into the lungs daily.   cyclobenzaprine 10 MG tablet Commonly known as: FLEXERIL TAKE 1 TABLET BY MOUTH EVERYDAY AT BEDTIME PRN   ezetimibe 10 MG tablet Commonly known as: ZETIA Take 1 tablet (10 mg total) by mouth daily.   gabapentin 300 MG capsule Commonly known as: NEURONTIN TAKE 1 CAPSULE BY MOUTH EVERYDAY AT BEDTIME   hydrochlorothiazide 25 MG tablet Commonly known as: HYDRODIURIL Take 1 tablet (25 mg total) by mouth daily.   KLS Aller-Flo 50 MCG/ACT nasal spray Generic drug: fluticasone Place into both nostrils daily.   losartan 50 MG tablet Commonly known as: COZAAR Take 1.5 tablets (75 mg total) by mouth daily.   metFORMIN 1000 MG tablet Commonly known as: GLUCOPHAGE Take 1 tablet (1,000 mg total) by mouth daily with breakfast.   montelukast 10 MG tablet Commonly known as: SINGULAIR Take 1 tablet (10 mg total) by mouth at bedtime.   omeprazole 40 MG capsule Commonly known as: PRILOSEC Take 1 capsule (40 mg total) by mouth daily.   rivaroxaban 10 MG Tabs tablet Commonly known as: Xarelto Take 1 tablet (10 mg total) by mouth daily.   UNABLE TO FIND Med Name: CPAP       All past medical history, surgical history, allergies, family history, immunizations andmedications were updated in the EMR today and reviewed under the history and medication portions of their EMR.     ROS: Negative, with the exception of above mentioned in HPI   Objective:  BP (!) 146/80 (BP Location: Right Arm, Patient Position: Sitting, Cuff Size: Large)   Pulse 70   Temp 97.7 F (36.5 C) (Temporal)   Resp 17   Ht 6\' 1"  (1.854 m)   Wt (!) 363 lb 8 oz (164.9 kg)   SpO2 95%   BMI 47.96 kg/m  Body mass index is 47.96 kg/m. Gen: Afebrile. No acute distress. Nontoxic in appearance, well developed, well nourished.  Obese, very pleasant Caucasian male. HENT: AT. [place].  Eyes:Pupils Equal Round Reactive to light, Extraocular movements intact,  Conjunctiva without redness, discharge or  icterus. CV: RRR * Chest: CTAB, no wheeze or crackles. Abd: Soft.  Obese. NTND. BS present.  No masses palpated. No rebound or guarding.  Skin: No rashes, purpura or petechiae.  Neuro:  Normal gait. PERLA. EOMi. Alert. Oriented x3   No exam data present No results found. No results found for this or any previous visit (from the past 24 hour(s)).  Assessment/Plan: Shanon Brow  Adrian [name] is a 58 y.o. male present for OV for  Elevated liver enzymes/Hepatic steatosis -Discussed further work-up with him today.  Although his elevated liver enzymes might be reflective of his fatty liver disease progressing, it is prudent to complete work-up to ensure no other processes is missed. -Labs today to rule out acute hepatitis and autoimmune hepatitis. -Abdominal ultrasound for evaluation of liver and potential causes of elevated liver enzymes. - Hepatitis, Acute - Anti-smooth muscle antibody, IgG -Use Flexeril if needed, since stopping the medicine did not change his liver enzymes.   -Discussed fatty liver disease process.  Patient was given AVS with instructions on dietary changes.  Essential hypertension Despite increasing patient's losartan at prior visit, his blood pressure still borderline today.  Went ahead and increase to losartan 100 mg daily. - losartan (COZAAR) 100 MG tablet; Take 1 tablet (100 mg total) by mouth daily.  Dispense: 90 tablet; Refill: 1 -Follow-up to remain as stated at his routine hypertension visit.   Reviewed expectations re: course of current medical issues.  Discussed self-management of symptoms.  Outlined signs and symptoms indicating need for more acute intervention.  Patient verbalized understanding and all questions were answered.  Patient received an After-Visit Summary.    No orders of the defined types were placed in this encounter. > 25 minutes spent with patient, >50% of time spent face to face      Note is dictated utilizing voice recognition software.  Although note has been proof read prior to signing, occasional typographical errors still can be missed. If any questions arise, please do not hesitate to call for verification.   electronically signed by:  Howard Pouch, DO  [place]

## 2019-07-10 NOTE — Patient Instructions (Addendum)
Increase losartan to 100 mg a day  Nonalcoholic Fatty Liver Disease Diet, Adult Nonalcoholic fatty liver disease is a condition that causes fat to build up in and around the liver. The disease makes it harder for the liver to work the way that it should. Following a healthy diet can help to keep nonalcoholic fatty liver disease under control. It can also help to prevent or improve conditions that are associated with the disease, such as heart disease, diabetes, high blood pressure, and abnormal cholesterol levels. Along with regular exercise, this diet:  Promotes weight loss.  Helps to control blood sugar levels.  Helps to improve the way that the body uses insulin. What are tips for following this plan? Reading food labels Always check food labels for:  The amount of saturated fat in a food. You should limit your intake of saturated fat. Saturated fat is found in foods that come from animals, including meat and dairy products such as butter, cheese, and whole milk.  The amount of fiber in a food. You should choose high-fiber foods such as fruits, vegetables, and whole grains. Try to get 25-30 grams (g) of fiber a day.  Cooking  When cooking, use heart-healthy oils that are high in monounsaturated fats. These include olive oil, canola oil, and avocado oil.  Limit frying or deep-frying foods. Cook foods using healthy methods such as baking, boiling, steaming, and grilling instead. Meal planning  You may want to keep track of how many calories you take in. Eating the right amount of calories will help you achieve a healthy weight. Meeting with a registered dietitian can help you get started.  Limit how often you eat takeout and fast food. These foods are usually very high in fat, salt, and sugar.  Use the glycemic index (GI) to plan your meals. The index tells you how quickly a food will raise your blood sugar. Choose low-GI foods (GI less than 55). These foods take a longer time to raise  blood sugar. A registered dietitian can help you identify foods lower on the GI scale. Lifestyle  You may want to follow a Mediterranean diet. This diet includes a lot of vegetables, lean meats or fish, whole grains, fruits, and healthy oils and fats. What foods can I eat?  Fruits Bananas. Apples. Oranges. Grapes. Papaya. Mango. Pomegranate. Kiwi. Grapefruit. Cherries. Vegetables Lettuce. Spinach. Peas. Beets. Cauliflower. Cabbage. Broccoli. Carrots. Tomatoes. Squash. Eggplant. Herbs. Peppers. Onions. Cucumbers. Brussels sprouts. Yams and sweet potatoes. Beans. Lentils. Grains Whole wheat or whole-grain foods, including breads, crackers, cereals, and pasta. Stone-ground whole wheat. Unsweetened oatmeal. Bulgur. Barley. Quinoa. Brown or wild rice. Corn or whole wheat flour tortillas. Meats and other proteins Lean meats. Poultry. Tofu. Seafood and shellfish. Dairy Low-fat or fat-free dairy products, such as yogurt, cottage cheese, or cheese. Beverages Water. Sugar-free drinks. Tea. Coffee. Low-fat or skim milk. Milk alternatives, such as soy or almond milk. Real fruit juice. Fats and oils Avocado. Canola or olive oil. Nuts and nut butters. Seeds. Seasonings and condiments Mustard. Relish. Low-fat, low-sugar ketchup and barbecue sauce. Low-fat or fat-free mayonnaise. Sweets and desserts Sugar-free sweets. The items listed above may not be a complete list of foods and beverages you can eat. Contact a dietitian for more information. What foods should I limit or avoid? Meats and other proteins Limit red meat to 1-2 times a week. Dairy [employer]. Fats and oils Palm oil and coconut oil. Fried foods. Other foods Processed foods. Foods that contain a lot of salt  or sodium. Sweets and desserts Sweets that contain sugar. Beverages Sweetened drinks, such as sweet tea, milkshakes, iced sweet drinks, and sodas. Alcohol. The items listed above may not be a complete list of foods and  beverages you should avoid. Contact a dietitian for more information. Where to find more information [employer] of Diabetes and Digestive and Kidney Diseases: [email or web address] Summary  Nonalcoholic fatty liver disease is a condition that causes fat to build up in and around the liver.  Following a healthy diet can help to keep nonalcoholic fatty liver disease under control. Your diet should be rich in fruits, vegetables, whole grains, and lean proteins.  Limit your intake of saturated fat. Saturated fat is found in foods that come from animals, including meat and dairy products such as butter, cheese, and whole milk.  This diet promotes weight loss, helps to control blood sugar levels, and helps to improve the way that the body uses insulin. This information is not intended to replace advice given to you by your health care provider. Make sure you discuss any questions you have with your health care provider. Document Released: 11/23/2014 Document Revised: 10/31/2018 Document Reviewed: 07/31/2018 Elsevier Patient Education  2020 [employer].

## 2019-07-15 LAB — ANTI-SMOOTH MUSCLE ANTIBODY, IGG: Actin (Smooth Muscle) Antibody (IGG): <20 U (ref <20)

## 2019-07-15 LAB — HEPATITIS PANEL, ACUTE
Hep A IgM: NONREACTIVE
Hep B C IgM: NONREACTIVE
Hepatitis B Surface Ag: NONREACTIVE
Hepatitis C Ab: NONREACTIVE
SIGNAL TO CUT-OFF: 0.02 (ref <1.00)

## 2019-07-21 ENCOUNTER — Other Ambulatory Visit: Payer: Federal, State, Local not specified - PPO

## 2019-07-23 ENCOUNTER — Other Ambulatory Visit: Payer: Self-pay

## 2019-07-23 ENCOUNTER — Ambulatory Visit (INDEPENDENT_AMBULATORY_CARE_PROVIDER_SITE_OTHER): Payer: Federal, State, Local not specified - PPO

## 2019-07-23 DIAGNOSIS — K760 Fatty (change of) liver, not elsewhere classified: Secondary | ICD-10-CM | POA: Diagnosis not present

## 2019-07-23 DIAGNOSIS — R748 Abnormal levels of other serum enzymes: Secondary | ICD-10-CM

## 2019-07-25 ENCOUNTER — Encounter: Payer: Self-pay | Admitting: Family Medicine

## 2019-08-20 ENCOUNTER — Other Ambulatory Visit: Payer: Self-pay | Admitting: Internal Medicine

## 2019-09-21 ENCOUNTER — Telehealth: Payer: Self-pay

## 2019-09-21 NOTE — Telephone Encounter (Signed)
Pt was called and given vaccine information/websites to keep up when it will be patients turn for the vaccine, he verbalized understanding

## 2019-09-21 NOTE — Telephone Encounter (Signed)
Wants to know if his eligible for COVID vaccine. High risk because of all his health complications  Please call [phone]

## 2019-10-05 ENCOUNTER — Ambulatory Visit: Payer: Federal, State, Local not specified - PPO | Admitting: Family Medicine

## 2019-10-06 DIAGNOSIS — G4733 Obstructive sleep apnea (adult) (pediatric): Secondary | ICD-10-CM | POA: Diagnosis not present

## 2019-10-22 ENCOUNTER — Other Ambulatory Visit: Payer: Self-pay | Admitting: Internal Medicine

## 2019-10-27 ENCOUNTER — Ambulatory Visit: Payer: Federal, State, Local not specified - PPO | Admitting: Family Medicine

## 2019-10-27 ENCOUNTER — Other Ambulatory Visit: Payer: Self-pay

## 2019-10-27 ENCOUNTER — Telehealth: Payer: Self-pay | Admitting: Family Medicine

## 2019-10-27 ENCOUNTER — Encounter: Payer: Self-pay | Admitting: Family Medicine

## 2019-10-27 VITALS — BP 139/82 | HR 62 | Temp 97.6°F | Resp 17 | Ht 73.0 in | Wt 366.1 lb

## 2019-10-27 DIAGNOSIS — J4530 Mild persistent asthma, uncomplicated: Secondary | ICD-10-CM

## 2019-10-27 DIAGNOSIS — I10 Essential (primary) hypertension: Secondary | ICD-10-CM

## 2019-10-27 DIAGNOSIS — Z23 Encounter for immunization: Secondary | ICD-10-CM

## 2019-10-27 DIAGNOSIS — G8929 Other chronic pain: Secondary | ICD-10-CM

## 2019-10-27 DIAGNOSIS — K760 Fatty (change of) liver, not elsewhere classified: Secondary | ICD-10-CM

## 2019-10-27 DIAGNOSIS — I700 Atherosclerosis of aorta: Secondary | ICD-10-CM

## 2019-10-27 DIAGNOSIS — E782 Mixed hyperlipidemia: Secondary | ICD-10-CM | POA: Diagnosis not present

## 2019-10-27 DIAGNOSIS — R748 Abnormal levels of other serum enzymes: Secondary | ICD-10-CM | POA: Diagnosis not present

## 2019-10-27 DIAGNOSIS — E119 Type 2 diabetes mellitus without complications: Secondary | ICD-10-CM

## 2019-10-27 DIAGNOSIS — M546 Pain in thoracic spine: Secondary | ICD-10-CM

## 2019-10-27 LAB — POCT GLYCOSYLATED HEMOGLOBIN (HGB A1C)
HbA1c POC (<> result, manual entry): 7.1 % (ref 4.0–5.6)
HbA1c, POC (controlled diabetic range): 7.1 % — AB (ref 0.0–7.0)
HbA1c, POC (prediabetic range): 7.1 % — AB (ref 5.7–6.4)
Hemoglobin A1C: 7.1 % — AB (ref 4.0–5.6)

## 2019-10-27 LAB — HEPATIC FUNCTION PANEL
ALT: 71 U/L — ABNORMAL HIGH (ref 0–53)
AST: 47 U/L — ABNORMAL HIGH (ref 0–37)
Albumin: 3.9 g/dL (ref 3.5–5.2)
Alkaline Phosphatase: 65 U/L (ref 39–117)
Bilirubin, Direct: 0.1 mg/dL (ref 0.0–0.3)
Total Bilirubin: 0.5 mg/dL (ref 0.2–1.2)
Total Protein: 6.9 g/dL (ref 6.0–8.3)

## 2019-10-27 MED ORDER — LOSARTAN POTASSIUM 100 MG PO TABS: 100.0000 mg | ORAL_TABLET | Freq: Every day | ORAL | 1 refills | Status: DC

## 2019-10-27 MED ORDER — BUDESONIDE 180 MCG/ACT IN AEPB: 2.0000 | INHALATION_SPRAY | Freq: Two times a day (BID) | RESPIRATORY_TRACT | 11 refills | Status: DC

## 2019-10-27 MED ORDER — AMLODIPINE BESYLATE 2.5 MG PO TABS: 2.5000 mg | ORAL_TABLET | Freq: Every day | ORAL | 1 refills | Status: DC

## 2019-10-27 MED ORDER — MONTELUKAST SODIUM 10 MG PO TABS: 10.0000 mg | ORAL_TABLET | Freq: Every day | ORAL | 3 refills | Status: DC

## 2019-10-27 MED ORDER — METFORMIN HCL 1000 MG PO TABS: 1000.0000 mg | ORAL_TABLET | Freq: Every day | ORAL | 1 refills | Status: DC

## 2019-10-27 MED ORDER — OMEPRAZOLE 40 MG PO CPDR: 40.0000 mg | DELAYED_RELEASE_CAPSULE | Freq: Every day | ORAL | 5 refills | Status: DC

## 2019-10-27 MED ORDER — CETIRIZINE HCL 10 MG PO TABS: 10.0000 mg | ORAL_TABLET | Freq: Every day | ORAL | 11 refills | Status: DC

## 2019-10-27 MED ORDER — HYDROCHLOROTHIAZIDE 25 MG PO TABS: 25.0000 mg | ORAL_TABLET | Freq: Every day | ORAL | 1 refills | Status: DC

## 2019-10-27 NOTE — Progress Notes (Addendum)
This visit occurred during the SARS-CoV-2 public health emergency.  Safety protocols were in place, including screening questions prior to the visit, additional usage of staff PPE, and extensive cleaning of exam room while observing appropriate contact time as indicated for disinfecting solutions.    Adrian [name] , [birth year], 59 y.o., male MRN: [id number] Patient Care Team    Relationship Specialty Notifications Start End  Ma Hillock, DO PCP - General Family Medicine  02/06/18   Tanda Rockers, MD Consulting Physician Pulmonary Disease  02/07/18   Otelia Sergeant, OD Referring Physician   02/07/18   Lavena Bullion, DO Consulting Physician Gastroenterology  01/20/19     Chief Complaint  Patient presents with  . Diabetes  . Hypertension  . Asthma    Pt states his inhaler is no longer covered by insurance, Asmanex. They will cover Budefonide.      Subjective:  Essential hypertension/hyperlipidemia/morbid obesity/CKD3 Pt reports compliance with losartan 100mg  QD and HCTZ 25 mg daily. Blood pressures ranges at home normal range.  He just took his medications before entering the door today. Patient denies chest pain, shortness of breath, dizziness or lower extremity edema.  Pt is not taking a daily baby ASA. Pt is not prescribed statin 2/2 intolerance x2. Started zeita and tolerating. Labs UTD 05/2019 Diet: low Salt Exercise: Routine exercise RF: Hypertension, hyperlipidemia, obesity, diabetes.   Type 2 diabetes mellitus without complication, without long-term current use of insulin ([place]) Patient reports compliance with metformin 1000 mg daily daily. Patient denies dizziness, hyperglycemic or hypoglycemic events. Patient denies numbness, tingling in the extremities or nonhealing wounds of feet.  He is prescribed gabapentin 300 mg nightly and reports it is very helpful-mostly for back/leg pain. Diabetes diagnosed 2019 PNA series: Pneumococcal 23 /2018-->prevnar completed  2019 Flu shot: completed 2020(recommneded yearly) Microalbumin: on ARB Foot exam: 711/2020UTD Eye exam: UTD 2020, Dr. Oswaldo Conroy, [facility] A1c: 6.6 >>5.8>>>6.3>> 7.2>7.1 today  Asthma: Patient reports his asthma has been pretty stable- not needing albuterol.Insurance now changed formulary and asmanex is not covered. He is compliant with Singulair nightly. He is not taking a daily antihistamine.   Pt presents for an OV to follow-up on his elevated liver enzymes found to be elevated on his routine labs for chronic conditions approximately 4-5 weeks ago.  Repeat enzymes are again elevated although AST decreased very mildly.  ALT 87 down to 81.  AST 60 down to 42.  Patient has not had elevated liver enzymes in the past.  Over the last 4 weeks he has avoided all Tylenol and has not used his Flexeril.  He also has avoided alcohol use.  He states he sometimes has a few glasses of wine a week, usually no more than 1 bottle wine a week.  He had a CT November 2019 which showed mild hepatic steatosis only.  His liver enzymes at that time were normal.  HIV was negative 2018.  Hepatitis C antibody was negative 2019.  Patient is asymptomatic currently.  But has had some flank and back pain on the right side intermittently over the last year.  Depression screen [facility] 2/9 06/04/2019 09/25/2018 08/19/2018 04/23/2018 02/06/2018  Decreased Interest 0 0 0 0 0  Down, Depressed, Hopeless 0 0 0 0 0  PHQ - 2 Score 0 0 0 0 0    Allergies  Allergen Reactions  . Lisinopril Cough  . Statins Other (See Comments)    Causes pain/ foot trouble   Social History  Social History Narrative   Marital status/children/pets: Married.   Education/employment: Associates degree, employed as a Armed forces operational officer:      -Wears a bicycle helmet riding a bike: No     -smoke alarm in the home:Yes     - wears seatbelt: Yes     - Feels safe in their relationships: Yes   Past Medical History:  Diagnosis Date  .  Adenomatous colon polyp    FS 02/2004; colo 2005 hyperplastic polyp; 2010 neg - butmucosal prolapse.   . Allergy   . Asthma   . Blood in stool   . Chicken pox   . Colon polyps   . Diabetes mellitus without complication ([place]) 99991111  . Diverticulosis   . Frequent headaches   . Genital warts   . GERD (gastroesophageal reflux disease)   . Hemorrhoid   . Hepatic steatosis 04/2019   Elevated liver enzymes-full work-up for infectious disease process with ultrasound normal with the exception of hepatic steatosis  . Hyperlipidemia   . Hypertension   . Hypogonadism in male    labs x2: 11/14/2015 - 216 and 11/17/2015 193; declined med at that time.   . Insomnia   . OSA on CPAP 09/18/2016   HST 09/18/2016; ESS-4; AHI-76; O2 min- 61%  . Pulmonary embolism ([place]) 01/2017   provoked by long care ride, on xarelto, est Dr. Melvyn Novas  . Thyroid disease    subclinical hypothyroidism per records   Past Surgical History:  Procedure Laterality Date  . ANKLE SURGERY    . COLONOSCOPY  2016   dr Cristina Gong   . ESOPHAGOGASTRODUODENOSCOPY     with dr Cristina Gong    Family History  Problem Relation Age of Onset  . Asthma Mother   . Arthritis Mother   . Allergies Brother   . Alcohol abuse Maternal Grandmother   . Cancer Maternal Grandmother   . Hiatal hernia Maternal Grandfather   . Colon cancer Neg Hx   . Esophageal cancer Neg Hx    Allergies as of 10/27/2019      Reactions   Lisinopril Cough   Statins Other (See Comments)   Causes pain/ foot trouble      Medication List       Accurate as of October 27, 2019  9:44 AM. If you have any questions, ask your nurse or doctor.        Asmanex (60 Metered Doses) 220 MCG/INH inhaler Generic drug: mometasone Inhale 2 puffs into the lungs daily.   cyclobenzaprine 10 MG tablet Commonly known as: FLEXERIL TAKE 1 TABLET BY MOUTH EVERYDAY AT BEDTIME PRN   ezetimibe 10 MG tablet Commonly known as: ZETIA Take 1 tablet (10 mg total) by mouth daily.   gabapentin  300 MG capsule Commonly known as: NEURONTIN TAKE 1 CAPSULE BY MOUTH EVERYDAY AT BEDTIME   hydrochlorothiazide 25 MG tablet Commonly known as: HYDRODIURIL Take 1 tablet (25 mg total) by mouth daily.   KLS Aller-Flo 50 MCG/ACT nasal spray Generic drug: fluticasone Place into both nostrils daily.   losartan 100 MG tablet Commonly known as: COZAAR Take 1 tablet (100 mg total) by mouth daily.   metFORMIN 1000 MG tablet Commonly known as: GLUCOPHAGE Take 1 tablet (1,000 mg total) by mouth daily with breakfast.   montelukast 10 MG tablet Commonly known as: SINGULAIR Take 1 tablet (10 mg total) by mouth at bedtime.   omeprazole 40 MG capsule Commonly known as: PRILOSEC Take 1 capsule (40 mg total) by mouth daily.  UNABLE TO FIND Med Name: CPAP   Xarelto 10 MG Tabs tablet Generic drug: rivaroxaban TAKE 1 TABLET BY MOUTH EVERY DAY       All past medical history, surgical history, allergies, family history, immunizations andmedications were updated in the EMR today and reviewed under the history and medication portions of their EMR.     ROS: Negative, with the exception of above mentioned in HPI   Objective:  BP 138/84 (BP Location: Left Arm, Patient Position: Sitting, Cuff Size: Large)   Pulse 62   Temp 97.6 F (36.4 C) (Temporal)   Resp 17   Ht 6\' 1"  (1.854 m)   Wt (!) 366 lb 2 oz (166.1 kg)   SpO2 93%   BMI 48.30 kg/m  Body mass index is 48.3 kg/m. Gen: Afebrile. No acute distress. Obese caucasian male.  HENT: AT. [place].  Eyes:Pupils Equal Round Reactive to light, Extraocular movements intact,  Conjunctiva without redness, discharge or icterus. CV: RRR, no edema, +2/4 P posterior tibialis pulses Chest: CTAB, no wheeze or crackles Skin: no rashes, purpura or petechiae.  Neuro:  Normal gait. PERLA. EOMi. Alert. Oriented. Psych: Normal affect, dress and demeanor. Normal speech. Normal thought content and judgment.  No exam data present No results found. Results  for orders placed or performed in visit on 10/27/19 (from the past 24 hour(s))  POCT glycosylated hemoglobin (Hb A1C)     Status: Abnormal   Collection Time: 10/27/19  9:43 AM  Result Value Ref Range   Hemoglobin A1C 7.1 (A) 4.0 - 5.6 %   HbA1c POC (<> result, manual entry) 7.1 4.0 - 5.6 %   HbA1c, POC (prediabetic range) 7.1 (A) 5.7 - 6.4 %   HbA1c, POC (controlled diabetic range) 7.1 (A) 0.0 - 7.0 %    Assessment/Plan: Adrian [name] is a 59 y.o. male present for OV for  Elevated liver enzymes/Hepatic steatosis -cute hepatitis and autoimmune hepatitis, HIV>negative -Abdominal ultrasound > hepatic steatosis.  -Use Flexeril if needed, since stopping the medicine did not change his liver enzymes.  - - lft today to ensure stable> if stable no further evaluation is needed   Diabetes: Type 2 diabetes mellitus without complication, without long-term current use of insulin ([facility]) -stable continue metformin 1000 mg daily. Continue diet and exercise modifications PNA series: Pneumonia series completed Flu shot: Up-to-date (recommneded yearly) Microalbumin: on ARB Foot exam: Completed  05/2019 Eye exam: Up-to-date 03/2019 Dr. Oswaldo Conroy, [place] [facility]- records requested and he was encouraged to schedule this year A1c: 6.6 >>5.8>>6.3>> 7.2>7.1 F/u 4 months  Mild persistent asthma without complication - mildly increasing this spring.  - Pulmicort 1-2 puffs BID.(fornualry changed from Asmanex) Continue Singulair daily.  - start zyrtec QHS  Chronic thoracic back pain: Stable.  Continue gabapentin nightly  Continue Flexeril nightly as needed.    Essential hypertension/morbid obesity/HLD/Aortic athersclerosis - above goal of 130/80 with is DM history.  - Continue  losartan 100 mg daily. - continue HCTZ 25 - start amlodipine 2.5 mg QD.  Low sodium diet Exercise.  - f/u 4 mos.   Shingrix #2 provided today   Reviewed expectations re: course of current medical issues.  Discussed  self-management of symptoms.  Outlined signs and symptoms indicating need for more acute intervention.  Patient verbalized understanding and all questions were answered.  Patient received an After-Visit Summary.    Orders Placed This Encounter  Procedures  . Varicella-zoster vaccine IM  . POCT glycosylated hemoglobin (Hb A1C)   Meds ordered this encounter  Medications  . metFORMIN (GLUCOPHAGE) 1000 MG tablet    Sig: Take 1 tablet (1,000 mg total) by mouth daily with breakfast.    Dispense:  90 tablet    Refill:  1  . montelukast (SINGULAIR) 10 MG tablet    Sig: Take 1 tablet (10 mg total) by mouth at bedtime.    Dispense:  90 tablet    Refill:  3  . omeprazole (PRILOSEC) 40 MG capsule    Sig: Take 1 capsule (40 mg total) by mouth daily.    Dispense:  30 capsule    Refill:  5  . losartan (COZAAR) 100 MG tablet    Sig: Take 1 tablet (100 mg total) by mouth daily.    Dispense:  90 tablet    Refill:  1    Please dc all other losartan scripts and remind pt of changes and only take one of these now. Thank you so much!  . hydrochlorothiazide (HYDRODIURIL) 25 MG tablet    Sig: Take 1 tablet (25 mg total) by mouth daily.    Dispense:  90 tablet    Refill:  1  . cetirizine (ZYRTEC) 10 MG tablet    Sig: Take 1 tablet (10 mg total) by mouth daily.    Dispense:  30 tablet    Refill:  11  . budesonide (PULMICORT) 180 MCG/ACT inhaler    Sig: Inhale 2 puffs into the lungs in the morning and at bedtime.    Dispense:  1 each    Refill:  11  . amLODipine (NORVASC) 2.5 MG tablet    Sig: Take 1 tablet (2.5 mg total) by mouth daily.    Dispense:  90 tablet    Refill:  1   Referral Orders  No referral(s) requested today     Note is dictated utilizing voice recognition software. Although note has been proof read prior to signing, occasional typographical errors still can be missed. If any questions arise, please do not hesitate to call for verification.   electronically signed  by:  Howard Pouch, DO  [place]

## 2019-10-27 NOTE — Telephone Encounter (Signed)
Pt was called and detailed message was left on patients VM letting him know about medication being sent with instructions. He was also asked to return call to schedule 4 month [facility] appt

## 2019-10-27 NOTE — Telephone Encounter (Signed)
Please inform pt I have added amlodipine 1 tab daily to his BP regimen, as we discussed. This has been called into his pharmacy.

## 2019-10-27 NOTE — Patient Instructions (Addendum)
Add zyrtec before bed for better allergy and asthma coverage.  Pulmicort 1-2 puffs twice a day.  Continue Singulair. a1c is 7.1 today. Continue diabetic diet and exercise.   Follow up in 4 months.

## 2019-10-29 ENCOUNTER — Telehealth: Payer: Self-pay

## 2019-10-29 NOTE — Telephone Encounter (Signed)
Pharmacy was called and they've had this RX ready for him since 10/27/2019. Pt was called and VM was left letting patient know that RX was ready and he needed to reach out to them and pick it up

## 2019-10-29 NOTE — Telephone Encounter (Signed)
Patient is requesting generic version of pulmicort to be sent to [place].

## 2019-11-15 ENCOUNTER — Other Ambulatory Visit: Payer: Self-pay | Admitting: Internal Medicine

## 2019-11-23 ENCOUNTER — Ambulatory Visit: Payer: Federal, State, Local not specified - PPO | Attending: Internal Medicine

## 2019-11-23 DIAGNOSIS — Z23 Encounter for immunization: Secondary | ICD-10-CM

## 2019-11-23 NOTE — Progress Notes (Signed)
   Covid-19 Vaccination Clinic  Name:  Adrian [name]    MRN: [id number] DOB: [birth year]  11/23/2019  [name] was observed post Covid-19 immunization for 15 minutes without incident. He was provided with Vaccine Information Sheet and instruction to access the V-Safe system.   [name] was instructed to call 911 with any severe reactions post vaccine: Marland Kitchen Difficulty breathing  . Swelling of face and throat  . A fast heartbeat  . A bad rash all over body  . Dizziness and weakness   Immunizations Administered    Name Date Dose VIS Date Route   Moderna COVID-19 Vaccine 11/23/2019 10:02 AM 0.5 mL 06/2019 Intramuscular   Manufacturer: Moderna   LotFP:[id number]   [id number]

## 2019-11-25 ENCOUNTER — Telehealth: Payer: Self-pay

## 2019-11-25 DIAGNOSIS — G8929 Other chronic pain: Secondary | ICD-10-CM

## 2019-11-25 MED ORDER — CYCLOBENZAPRINE HCL 10 MG PO TABS: ORAL_TABLET | ORAL | 5 refills | Status: DC

## 2019-11-25 NOTE — Telephone Encounter (Signed)
Refilled requested medication

## 2019-11-25 NOTE — Telephone Encounter (Signed)
RF request for Flexeril  LOV: 10/27/2019 Next ov: 02/25/2020 Last written: 06/04/2019 #30 x5

## 2019-11-25 NOTE — Addendum Note (Signed)
Addended by: Howard Pouch A on: 11/25/2019 11:59 AM   Modules accepted: Orders

## 2019-12-20 ENCOUNTER — Other Ambulatory Visit: Payer: Self-pay | Admitting: Internal Medicine

## 2019-12-28 ENCOUNTER — Ambulatory Visit: Payer: Self-pay | Attending: Internal Medicine

## 2019-12-28 ENCOUNTER — Ambulatory Visit: Payer: Federal, State, Local not specified - PPO

## 2019-12-28 DIAGNOSIS — Z23 Encounter for immunization: Secondary | ICD-10-CM

## 2019-12-28 NOTE — Progress Notes (Signed)
   Covid-19 Vaccination Clinic  Name:  Adrian [name]    MRN: [id number] DOB: [birth year]  12/28/2019  Mr. [name] was observed post Covid-19 immunization for 15 minutes without incident. He was provided with Vaccine Information Sheet and instruction to access the V-Safe system.   Mr. [name] was instructed to call 911 with any severe reactions post vaccine: Marland Kitchen Difficulty breathing  . Swelling of face and throat  . A fast heartbeat  . A bad rash all over body  . Dizziness and weakness   Immunizations Administered    Name Date Dose VIS Date Route   Moderna COVID-19 Vaccine 12/28/2019  9:54 AM 0.5 mL 06/2019 Intramuscular   Manufacturer: Moderna   Lot: [id number]   [place]: [id number]

## 2020-01-06 DIAGNOSIS — G4733 Obstructive sleep apnea (adult) (pediatric): Secondary | ICD-10-CM | POA: Diagnosis not present

## 2020-01-12 DIAGNOSIS — K080 Exfoliation of teeth due to systemic causes: Secondary | ICD-10-CM | POA: Diagnosis not present

## 2020-01-15 ENCOUNTER — Other Ambulatory Visit: Payer: Self-pay | Admitting: Internal Medicine

## 2020-01-20 ENCOUNTER — Other Ambulatory Visit: Payer: Self-pay

## 2020-01-20 MED ORDER — RIVAROXABAN 10 MG PO TABS: 10.0000 mg | ORAL_TABLET | Freq: Every day | ORAL | 2 refills | Status: DC

## 2020-01-20 NOTE — Telephone Encounter (Signed)
RF request for Xarelto LOV:10/27/19 Next ov: 02/25/20 Last written:11/16/19(30,0)   This medication was originally prescribed by Dr.Wert, recently denied on 01/15/20. Medication pending, please advise if refill appropriate.

## 2020-02-25 ENCOUNTER — Ambulatory Visit: Payer: Federal, State, Local not specified - PPO | Admitting: Family Medicine

## 2020-04-06 ENCOUNTER — Ambulatory Visit: Payer: Federal, State, Local not specified - PPO | Admitting: Family Medicine

## 2020-04-06 ENCOUNTER — Encounter: Payer: Self-pay | Admitting: Family Medicine

## 2020-04-06 ENCOUNTER — Other Ambulatory Visit: Payer: Self-pay

## 2020-04-06 VITALS — BP 142/77 | HR 65 | Temp 98.6°F | Resp 16 | Ht 73.0 in | Wt 366.0 lb

## 2020-04-06 DIAGNOSIS — Z7901 Long term (current) use of anticoagulants: Secondary | ICD-10-CM

## 2020-04-06 DIAGNOSIS — K760 Fatty (change of) liver, not elsewhere classified: Secondary | ICD-10-CM

## 2020-04-06 DIAGNOSIS — E119 Type 2 diabetes mellitus without complications: Secondary | ICD-10-CM | POA: Diagnosis not present

## 2020-04-06 DIAGNOSIS — I700 Atherosclerosis of aorta: Secondary | ICD-10-CM

## 2020-04-06 DIAGNOSIS — E782 Mixed hyperlipidemia: Secondary | ICD-10-CM | POA: Diagnosis not present

## 2020-04-06 DIAGNOSIS — I2699 Other pulmonary embolism without acute cor pulmonale: Secondary | ICD-10-CM

## 2020-04-06 DIAGNOSIS — G4733 Obstructive sleep apnea (adult) (pediatric): Secondary | ICD-10-CM | POA: Diagnosis not present

## 2020-04-06 DIAGNOSIS — J4530 Mild persistent asthma, uncomplicated: Secondary | ICD-10-CM

## 2020-04-06 DIAGNOSIS — Z23 Encounter for immunization: Secondary | ICD-10-CM

## 2020-04-06 DIAGNOSIS — M546 Pain in thoracic spine: Secondary | ICD-10-CM

## 2020-04-06 DIAGNOSIS — R748 Abnormal levels of other serum enzymes: Secondary | ICD-10-CM

## 2020-04-06 DIAGNOSIS — I10 Essential (primary) hypertension: Secondary | ICD-10-CM

## 2020-04-06 DIAGNOSIS — G8929 Other chronic pain: Secondary | ICD-10-CM

## 2020-04-06 LAB — POCT GLYCOSYLATED HEMOGLOBIN (HGB A1C)
HbA1c POC (<> result, manual entry): 8.9 % (ref 4.0–5.6)
HbA1c, POC (controlled diabetic range): 8.9 % — AB (ref 0.0–7.0)
HbA1c, POC (prediabetic range): 8.9 % — AB (ref 5.7–6.4)
Hemoglobin A1C: 8.9 % — AB (ref 4.0–5.6)

## 2020-04-06 MED ORDER — CYCLOBENZAPRINE HCL 10 MG PO TABS: ORAL_TABLET | ORAL | 5 refills | Status: DC

## 2020-04-06 MED ORDER — EZETIMIBE 10 MG PO TABS: 10.0000 mg | ORAL_TABLET | Freq: Every day | ORAL | 3 refills | Status: DC

## 2020-04-06 MED ORDER — HYDROCHLOROTHIAZIDE 25 MG PO TABS: 25.0000 mg | ORAL_TABLET | Freq: Every day | ORAL | 1 refills | Status: DC

## 2020-04-06 MED ORDER — METFORMIN HCL 1000 MG PO TABS: 1000.0000 mg | ORAL_TABLET | Freq: Two times a day (BID) | ORAL | 1 refills | Status: DC

## 2020-04-06 MED ORDER — AMLODIPINE BESYLATE 5 MG PO TABS: 5.0000 mg | ORAL_TABLET | Freq: Every day | ORAL | 1 refills | Status: DC

## 2020-04-06 MED ORDER — LOSARTAN POTASSIUM 100 MG PO TABS: 100.0000 mg | ORAL_TABLET | Freq: Every day | ORAL | 1 refills | Status: DC

## 2020-04-06 MED ORDER — BUDESONIDE 180 MCG/ACT IN AEPB
2.0000 | INHALATION_SPRAY | Freq: Two times a day (BID) | RESPIRATORY_TRACT | 11 refills | Status: DC
Start: 2020-04-06 — End: 2020-12-08

## 2020-04-06 MED ORDER — RIVAROXABAN 10 MG PO TABS
10.0000 mg | ORAL_TABLET | Freq: Every day | ORAL | 1 refills | Status: DC
Start: 2020-04-06 — End: 2020-07-06

## 2020-04-06 MED ORDER — GABAPENTIN 300 MG PO CAPS: ORAL_CAPSULE | ORAL | 2 refills | Status: DC

## 2020-04-06 MED ORDER — MONTELUKAST SODIUM 10 MG PO TABS: 10.0000 mg | ORAL_TABLET | Freq: Every day | ORAL | 3 refills | Status: DC

## 2020-04-06 NOTE — Progress Notes (Addendum)
This visit occurred during the SARS-CoV-2 public health emergency.  Safety protocols were in place, including screening questions prior to the visit, additional usage of staff PPE, and extensive cleaning of exam room while observing appropriate contact time as indicated for disinfecting solutions.    Adrian [name] , Feb 24, 1961, 59 y.o., male MRN: [id number] Patient Care Team    Relationship Specialty Notifications Start End  Ma Hillock, DO PCP - General Family Medicine  02/06/18   Tanda Rockers, MD Consulting Physician Pulmonary Disease  02/07/18   Otelia Sergeant, OD Referring Physician   02/07/18   Lavena Bullion, DO Consulting Physician Gastroenterology  01/20/19     Chief Complaint  Patient presents with  . Follow-up    CMC/DM     Subjective: Adrian [name] is a 59 y.o.  Essential hypertension/hyperlipidemia/morbid obesity/CKD3 Pt reports  compliance with losartan 100mg  QD and HCTZ 25 mg daily. Blood pressures ranges at home normal range.Patient denies chest pain, shortness of breath, dizziness or lower extremity edema.  Pt is not taking a daily baby ASA. Pt is not prescribed statin 2/2 intolerance x2. Compliance with zeita and tolerating. Labs due next appointment Diet: low Salt Exercise: Routine exercise RF: Hypertension, hyperlipidemia, obesity, diabetes.   Type 2 diabetes mellitus without complication, without long-term current use of insulin ([place]) Patient reports compliance with metformin 1000 mg daily daily. Patient denies dizziness, hyperglycemic or hypoglycemic events. Patient denies numbness, tingling in the extremities or nonhealing wounds of feet.  He is prescribed gabapentin 300 mg nightly and reports it is very helpful-mostly for back/leg pain. Diabetes diagnosed 2019 PNA series: Pneumococcal 23 /2018-->prevnar completed 2019 Flu shot: completed 2021-today (recommneded yearly) Microalbumin: on ARB Foot exam: 04/06/2020 Eye exam: UTD 2020, Dr. Oswaldo Conroy,  [facility]> encouraged.  A1c: 6.6 >>5.8>>>6.3>> 7.2>7.1> 8.9 today  Asthma: Patient reports his asthma has been rather stable.  Not needing albuterol breakthrough. Compliant with Pulmicort. He is compliant with Singulair nightly. He is not taking a daily antihistamine.    Depression screen [place] 2/9 04/07/2020 06/04/2019 09/25/2018 08/19/2018 04/23/2018  Decreased Interest 0 0 0 0 0  Down, Depressed, Hopeless 0 0 0 0 0  PHQ - 2 Score 0 0 0 0 0    Allergies  Allergen Reactions  . Lisinopril Cough  . Statins Other (See Comments)    Causes pain/ foot trouble   Social History   Social History Narrative   Marital status/children/pets: Married.   Education/employment: Associates degree, employed as a Armed forces operational officer:      -Wears a bicycle helmet riding a bike: No     -smoke alarm in the home:Yes     - wears seatbelt: Yes     - Feels safe in their relationships: Yes   Past Medical History:  Diagnosis Date  . Adenomatous colon polyp    FS 02/2004; colo 2005 hyperplastic polyp; 2010 neg - butmucosal prolapse.   . Allergy   . Asthma   . Blood in stool   . Chicken pox   . Colon polyps   . Diabetes mellitus without complication ([place]) 54/6270  . Diverticulosis   . Frequent headaches   . Genital warts   . GERD (gastroesophageal reflux disease)   . Hemorrhoid   . Hepatic steatosis 04/2019   Elevated liver enzymes-full work-up for infectious disease process with ultrasound normal with the exception of hepatic steatosis  . Hyperlipidemia   . Hypertension   . Hypogonadism in male  labs x2: 11/14/2015 - 216 and 11/17/2015 193; declined med at that time.   . Insomnia   . OSA on CPAP 09/18/2016   HST 09/18/2016; ESS-4; AHI-76; O2 min- 61%  . Pulmonary embolism ([place]) 01/2017   provoked by long care ride, on xarelto, est Dr. Melvyn Novas  . Thyroid disease    subclinical hypothyroidism per records   Past Surgical History:  Procedure Laterality Date  . ANKLE SURGERY    .  COLONOSCOPY  2016   dr Cristina Gong   . ESOPHAGOGASTRODUODENOSCOPY     with dr Cristina Gong    Family History  Problem Relation Age of Onset  . Asthma Mother   . Arthritis Mother   . Allergies Brother   . Alcohol abuse Maternal Grandmother   . Cancer Maternal Grandmother   . Hiatal hernia Maternal Grandfather   . Colon cancer Neg Hx   . Esophageal cancer Neg Hx    Allergies as of 04/06/2020      Reactions   Lisinopril Cough   Statins Other (See Comments)   Causes pain/ foot trouble      Medication List       Accurate as of April 06, 2020 11:59 PM. If you have any questions, ask your nurse or doctor.        STOP taking these medications   omeprazole 40 MG capsule Commonly known as: PRILOSEC Stopped by: Howard Pouch, DO     TAKE these medications   amLODipine 5 MG tablet Commonly known as: NORVASC Take 1 tablet (5 mg total) by mouth daily. What changed:   medication strength  how much to take Changed by: Howard Pouch, DO   budesonide 180 MCG/ACT inhaler Commonly known as: PULMICORT Inhale 2 puffs into the lungs in the morning and at bedtime.   cetirizine 10 MG tablet Commonly known as: ZYRTEC Take 1 tablet (10 mg total) by mouth daily.   cyclobenzaprine 10 MG tablet Commonly known as: FLEXERIL TAKE 1 TABLET BY MOUTH EVERYDAY AT BEDTIME PRN   ezetimibe 10 MG tablet Commonly known as: ZETIA Take 1 tablet (10 mg total) by mouth daily.   gabapentin 300 MG capsule Commonly known as: NEURONTIN TAKE 1 CAPSULE BY MOUTH EVERYDAY AT BEDTIME   hydrochlorothiazide 25 MG tablet Commonly known as: HYDRODIURIL Take 1 tablet (25 mg total) by mouth daily.   KLS Aller-Flo 50 MCG/ACT nasal spray Generic drug: fluticasone Place into both nostrils daily.   losartan 100 MG tablet Commonly known as: COZAAR Take 1 tablet (100 mg total) by mouth daily.   metFORMIN 1000 MG tablet Commonly known as: GLUCOPHAGE Take 1 tablet (1,000 mg total) by mouth 2 (two) times daily  with a meal. What changed: when to take this Changed by: Howard Pouch, DO   montelukast 10 MG tablet Commonly known as: SINGULAIR Take 1 tablet (10 mg total) by mouth at bedtime.   rivaroxaban 10 MG Tabs tablet Commonly known as: Xarelto Take 1 tablet (10 mg total) by mouth daily.   UNABLE TO FIND Med Name: CPAP       All past medical history, surgical history, allergies, family history, immunizations andmedications were updated in the EMR today and reviewed under the history and medication portions of their EMR.     ROS: Negative, with the exception of above mentioned in HPI   Objective:  BP (!) 142/77 (BP Location: Left Arm, Patient Position: Sitting, Cuff Size: Large)   Pulse 65   Temp 98.6 F (37 C) (Oral)  Resp 16   Ht 6\' 1"  (1.854 m)   Wt (!) 366 lb (166 kg)   SpO2 94%   BMI 48.29 kg/m  Body mass index is 48.29 kg/m. Gen: Afebrile. No acute distress.  Pleasant, obese, Caucasian male HENT: AT. [place]. e. MMM.  No cough.  No shortness of breath. Eyes:Pupils Equal Round Reactive to light, Extraocular movements intact,  Conjunctiva without redness, discharge or icterus. Neck/lymp/endocrine: Supple, no lymphadenopathy, no thyromegaly CV: RRR no murmur, no edema, +2/4 P posterior tibialis pulses Chest: CTAB, no wheeze or crackles Abd: Soft.  Obese. NTND. BS present.  No masses palpated.  Skin: No rashes, purpura or petechiae.  Neuro:  Normal gait. PERLA. EOMi. Alert. Oriented x3  Psych: Normal affect, dress and demeanor. Normal speech. Normal thought content and judgment. Diabetic Foot Exam - Simple   Simple Foot Form Diabetic Foot exam was performed with the following findings: Yes 04/07/2020  6:20 PM  Visual Inspection No deformities, no ulcerations, no other skin breakdown bilaterally: Yes Sensation Testing Intact to touch and monofilament testing bilaterally: Yes Pulse Check Posterior Tibialis and Dorsalis pulse intact bilaterally: Yes Comments        No  exam data present No results found. No results found for this or any previous visit (from the past 24 hour(s)).  Assessment/Plan: Adrian [name] is a 59 y.o. male present for OV for  Elevated liver enzymes/Hepatic steatosis -cute hepatitis and autoimmune hepatitis, HIV>negative -Abdominal ultrasound > hepatic steatosis.  -Use Flexeril if needed, since stopping the medicine did not change his liver enzymes.  -L FTs have been stable we will collect with routine labs yearly.  Type 2 diabetes mellitus without complication, without long-term current use of insulin ([facility]) -A1c continues to elevated 8.9 today.  We discussed his diet and he has been eating more starchy foods which may be partially responsible for increasing his A1c.  He is not being as active as well.  Hopefully once he gets back on his exercise regiment and starts following his diet his A1c will decrease back to where it normally is at goal.  If not may need to consider adding on second agent. -A1c goal less than 7, and closer to 6.5 desired. Increase metformin 1000 milligrams to twice daily dosing.  Continue diet and exercise modifications PNA series: Pneumonia series completed Flu shot: Up-to-date (recommneded yearly) Microalbumin: on ARB Foot exam: Completed today 03/2020 Eye exam: 03/2019 Dr. Oswaldo Conroy, [facility]-patient was encouraged to schedule his eye exam A1c: 6.6 >>5.8>>6.3>> 7.2>7.1> 8.9    Mild persistent asthma without complication -Stable.  -Continue Pulmicort 1-2 puffs BID.(fornualry changed from Asmanex) Continue Singulair 10 mg daily.  Continue Zyrtec 10 mg nightly  Chronic thoracic back pain: Stable Continue gabapentin nightly  Continue Flexeril 10 mg nightly as needed.    Essential hypertension/morbid obesity/HLD/Aortic athersclerosis - above goal of 130/80 with is DM history.  -Continue losartan 100 mg daily. -Continue HCTZ 25 -Increase amlodipine 2.5 mg > 5 mg daily -Continue Zetia 10 mg  daily Low sodium diet Exercise.    Influenza vaccine administered today  Follow-up in 3 months since A1c is rising.   Reviewed expectations re: course of current medical issues.  Discussed self-management of symptoms.  Outlined signs and symptoms indicating need for more acute intervention.  Patient verbalized understanding and all questions were answered.  Patient received an After-Visit Summary.    Orders Placed This Encounter  Procedures  . Flu Vaccine QUAD 36+ mos IM  . POCT HgB A1C  Meds ordered this encounter  Medications  . amLODipine (NORVASC) 5 MG tablet    Sig: Take 1 tablet (5 mg total) by mouth daily.    Dispense:  90 tablet    Refill:  1  . montelukast (SINGULAIR) 10 MG tablet    Sig: Take 1 tablet (10 mg total) by mouth at bedtime.    Dispense:  90 tablet    Refill:  3  . losartan (COZAAR) 100 MG tablet    Sig: Take 1 tablet (100 mg total) by mouth daily.    Dispense:  90 tablet    Refill:  1    Please dc all other losartan scripts and remind pt of changes and only take one of these now. Thank you so much!  . hydrochlorothiazide (HYDRODIURIL) 25 MG tablet    Sig: Take 1 tablet (25 mg total) by mouth daily.    Dispense:  90 tablet    Refill:  1  . gabapentin (NEURONTIN) 300 MG capsule    Sig: TAKE 1 CAPSULE BY MOUTH EVERYDAY AT BEDTIME    Dispense:  90 capsule    Refill:  2  . ezetimibe (ZETIA) 10 MG tablet    Sig: Take 1 tablet (10 mg total) by mouth daily.    Dispense:  90 tablet    Refill:  3  . cyclobenzaprine (FLEXERIL) 10 MG tablet    Sig: TAKE 1 TABLET BY MOUTH EVERYDAY AT BEDTIME PRN    Dispense:  30 tablet    Refill:  5  . budesonide (PULMICORT) 180 MCG/ACT inhaler    Sig: Inhale 2 puffs into the lungs in the morning and at bedtime.    Dispense:  1 each    Refill:  11  . rivaroxaban (XARELTO) 10 MG TABS tablet    Sig: Take 1 tablet (10 mg total) by mouth daily.    Dispense:  90 tablet    Refill:  1  . metFORMIN (GLUCOPHAGE) 1000  MG tablet    Sig: Take 1 tablet (1,000 mg total) by mouth 2 (two) times daily with a meal.    Dispense:  180 tablet    Refill:  1   Referral Orders  No referral(s) requested today     Note is dictated utilizing voice recognition software. Although note has been proof read prior to signing, occasional typographical errors still can be missed. If any questions arise, please do not hesitate to call for verification.   electronically signed by:  Howard Pouch, DO  [place]

## 2020-04-06 NOTE — Patient Instructions (Addendum)
Med changes:  Increase metformin to twice daily.  Increase amlodipine to 5 mg a day ( new bottle will be 5 mg tablets) Avoid high starch foods- just like you would sugar foods. Stick to "low glycemic index foods".  Next appt 3 months   a1c was 8.9 today. Goal Less than 7 (and best if closer to 6.5)

## 2020-04-13 DIAGNOSIS — R0902 Hypoxemia: Secondary | ICD-10-CM | POA: Diagnosis not present

## 2020-04-22 ENCOUNTER — Other Ambulatory Visit: Payer: Self-pay | Admitting: Family Medicine

## 2020-05-12 ENCOUNTER — Other Ambulatory Visit (HOSPITAL_BASED_OUTPATIENT_CLINIC_OR_DEPARTMENT_OTHER): Payer: Self-pay

## 2020-05-12 DIAGNOSIS — G4734 Idiopathic sleep related nonobstructive alveolar hypoventilation: Secondary | ICD-10-CM

## 2020-05-12 DIAGNOSIS — G4733 Obstructive sleep apnea (adult) (pediatric): Secondary | ICD-10-CM

## 2020-05-29 ENCOUNTER — Other Ambulatory Visit: Payer: Self-pay | Admitting: Family Medicine

## 2020-06-12 ENCOUNTER — Other Ambulatory Visit: Payer: Self-pay

## 2020-06-12 ENCOUNTER — Ambulatory Visit (HOSPITAL_BASED_OUTPATIENT_CLINIC_OR_DEPARTMENT_OTHER): Payer: Federal, State, Local not specified - PPO | Attending: Internal Medicine | Admitting: Internal Medicine

## 2020-06-12 VITALS — Ht 73.0 in | Wt 360.0 lb

## 2020-06-12 DIAGNOSIS — E669 Obesity, unspecified: Secondary | ICD-10-CM | POA: Insufficient documentation

## 2020-06-12 DIAGNOSIS — Z6841 Body Mass Index (BMI) 40.0 and over, adult: Secondary | ICD-10-CM | POA: Insufficient documentation

## 2020-06-12 DIAGNOSIS — Z9989 Dependence on other enabling machines and devices: Secondary | ICD-10-CM | POA: Diagnosis not present

## 2020-06-12 DIAGNOSIS — G4733 Obstructive sleep apnea (adult) (pediatric): Secondary | ICD-10-CM | POA: Diagnosis not present

## 2020-06-12 DIAGNOSIS — Z7951 Long term (current) use of inhaled steroids: Secondary | ICD-10-CM | POA: Diagnosis not present

## 2020-06-12 DIAGNOSIS — Z79899 Other long term (current) drug therapy: Secondary | ICD-10-CM | POA: Diagnosis not present

## 2020-06-12 DIAGNOSIS — R0902 Hypoxemia: Secondary | ICD-10-CM | POA: Diagnosis not present

## 2020-06-13 ENCOUNTER — Other Ambulatory Visit (HOSPITAL_BASED_OUTPATIENT_CLINIC_OR_DEPARTMENT_OTHER): Payer: Self-pay

## 2020-06-13 DIAGNOSIS — G4733 Obstructive sleep apnea (adult) (pediatric): Secondary | ICD-10-CM

## 2020-06-13 DIAGNOSIS — G4734 Idiopathic sleep related nonobstructive alveolar hypoventilation: Secondary | ICD-10-CM

## 2020-06-19 DIAGNOSIS — G4733 Obstructive sleep apnea (adult) (pediatric): Secondary | ICD-10-CM | POA: Diagnosis not present

## 2020-06-19 NOTE — Procedures (Signed)
   NAME: Adrian [name] DATE OF BIRTH:  [birth year] MEDICAL RECORD NUMBER [id number]  LOCATION: [facility]  PHYSICIAN: Marius Ditch  DATE OF STUDY: 06/12/2020  SLEEP STUDY TYPE: Positive Airway Pressure Titration               REFERRING PHYSICIAN: Marius Ditch, MD  INDICATION FOR STUDY: persisting hypoxemia on CPAP being used to treat severe OSA. Likely has obesity hypoventilation syndrome  EPWORTH SLEEPINESS SCORE:  NA HEIGHT: 6\' 1"  (185.4 cm)  WEIGHT: (!) 360 lb (163.3 kg)    Body mass index is 47.5 kg/m.  NECK SIZE: 21 in.  MEDICATIONS Patient self administered medications include: pulmicort flexhaler, CETIRIZINE, SINGULAIR, GABAPENTIN. Medications administered during study include No sleep medicine administered.Marland Kitchen  SLEEP STUDY TECHNIQUE The patient underwent an attended overnight polysomnography titration to assess the effects of cpap therapy. The following variables were monitored: EEG (C4-A1, C3-A2, O1-A2, O2-A1, F3-M2, F4-M1), EOG, submental and leg EMG, ECG, oxyhemoglobin saturation by pulse oximetry, thoracic and abdominal respiratory effort belts, nasal/oral airflow by pressure sensor, body position sensor and snoring sensor. CPAP pressure was titrated to eliminate apneas, hypopneas and oxygen desaturation.  TECHNICAL COMMENTS Comments added by Technician: Patient had difficulty initiating sleep. Comments added by Scorer: N/A  SLEEP ARCHITECTURE The study was initiated at 9:55:31 PM and terminated at 5:02:05 AM. Total recorded time was 426.6 minutes. EEG confirmed total sleep time was 203.5 minutes yielding a sleep efficiency of 47.7%%. Sleep onset after lights out was 66.9 minutes with a REM latency of 194.0 minutes. The patient spent 15.7%% of the night in stage N1 sleep, 63.9%% in stage N2 sleep, 0.0%% in stage N3 and 20.4% in REM. The Arousal Index was 7.7/hour.  RESPIRATORY PARAMETERS The overall AHI was 8.8 per hour, and the RDI was 10.0  events/hour with a central apnea index of 0.0per hour. The patient was started on CPAP. However, this did not resolve his breathing events. The most appropriate setting of BiPAP was IPAP/EPAP 24/20 cm H2O. At this setting, during which he slept only 3 minutes, he had no events. However, in his post-test questionnaire he noted that he had GI discomfort for air from the high pressure. Hypoxemia persisted through most pressure settings.   LEG MOVEMENT DATA The total leg movements were 0 with a resulting leg movement index of 0.0. Associated arousal with leg movement index was 0.0.  CARDIAC DATA The underlying cardiac rhythm was most consistent with sinus rhythm. Mean heart rate during sleep was 58.3 bpm. Additional rhythm abnormalities include None.  IMPRESSIONS - Very low sleep efficiency (48%) - Severe OSA incompletely titrated on this study.  - Persisting hypoxemia at most pressures - Inability to tolerate high pressures due to GI air.   DIAGNOSIS - Obstructive Sleep Apnea (G47.33)  RECOMMENDATIONS - The patient will likely need oxygen to solve his hypoxemia since higher pressures don't really resolve it and the patient can't tolerate the higher pressures - Consider repeat PAP titration with CPAP limiting maximum pressure to 12 cm water pressure and using supplemental oxygen as need to keep O2 sat above 88%.   Marius Ditch Sleep specialist, [place] Board of Internal Medicine  ELECTRONICALLY SIGNED ON:  06/19/2020, 7:10 PM [place] PH: [phone]   FX: [phone] [place]

## 2020-06-24 DIAGNOSIS — Z86010 Personal history of colonic polyps: Secondary | ICD-10-CM | POA: Diagnosis not present

## 2020-06-24 DIAGNOSIS — Z7901 Long term (current) use of anticoagulants: Secondary | ICD-10-CM | POA: Diagnosis not present

## 2020-07-06 ENCOUNTER — Ambulatory Visit: Payer: Federal, State, Local not specified - PPO | Admitting: Family Medicine

## 2020-07-06 ENCOUNTER — Other Ambulatory Visit: Payer: Self-pay

## 2020-07-06 ENCOUNTER — Encounter: Payer: Self-pay | Admitting: Family Medicine

## 2020-07-06 VITALS — BP 134/76 | HR 72 | Temp 98.6°F | Ht 73.0 in | Wt 364.0 lb

## 2020-07-06 DIAGNOSIS — E038 Other specified hypothyroidism: Secondary | ICD-10-CM | POA: Insufficient documentation

## 2020-07-06 DIAGNOSIS — M546 Pain in thoracic spine: Secondary | ICD-10-CM

## 2020-07-06 DIAGNOSIS — G4734 Idiopathic sleep related nonobstructive alveolar hypoventilation: Secondary | ICD-10-CM | POA: Insufficient documentation

## 2020-07-06 DIAGNOSIS — Z7901 Long term (current) use of anticoagulants: Secondary | ICD-10-CM

## 2020-07-06 DIAGNOSIS — I700 Atherosclerosis of aorta: Secondary | ICD-10-CM

## 2020-07-06 DIAGNOSIS — R109 Unspecified abdominal pain: Secondary | ICD-10-CM

## 2020-07-06 DIAGNOSIS — I10 Essential (primary) hypertension: Secondary | ICD-10-CM | POA: Diagnosis not present

## 2020-07-06 DIAGNOSIS — E119 Type 2 diabetes mellitus without complications: Secondary | ICD-10-CM

## 2020-07-06 DIAGNOSIS — Z86711 Personal history of pulmonary embolism: Secondary | ICD-10-CM | POA: Insufficient documentation

## 2020-07-06 DIAGNOSIS — E7800 Pure hypercholesterolemia, unspecified: Secondary | ICD-10-CM | POA: Insufficient documentation

## 2020-07-06 DIAGNOSIS — J301 Allergic rhinitis due to pollen: Secondary | ICD-10-CM | POA: Insufficient documentation

## 2020-07-06 DIAGNOSIS — R748 Abnormal levels of other serum enzymes: Secondary | ICD-10-CM

## 2020-07-06 DIAGNOSIS — Z86010 Personal history of colonic polyps: Secondary | ICD-10-CM | POA: Insufficient documentation

## 2020-07-06 DIAGNOSIS — R7303 Prediabetes: Secondary | ICD-10-CM | POA: Insufficient documentation

## 2020-07-06 DIAGNOSIS — J4530 Mild persistent asthma, uncomplicated: Secondary | ICD-10-CM | POA: Diagnosis not present

## 2020-07-06 DIAGNOSIS — I2699 Other pulmonary embolism without acute cor pulmonale: Secondary | ICD-10-CM

## 2020-07-06 DIAGNOSIS — E782 Mixed hyperlipidemia: Secondary | ICD-10-CM | POA: Diagnosis not present

## 2020-07-06 DIAGNOSIS — J4520 Mild intermittent asthma, uncomplicated: Secondary | ICD-10-CM | POA: Insufficient documentation

## 2020-07-06 DIAGNOSIS — R7989 Other specified abnormal findings of blood chemistry: Secondary | ICD-10-CM | POA: Insufficient documentation

## 2020-07-06 DIAGNOSIS — E559 Vitamin D deficiency, unspecified: Secondary | ICD-10-CM | POA: Diagnosis not present

## 2020-07-06 DIAGNOSIS — G8929 Other chronic pain: Secondary | ICD-10-CM

## 2020-07-06 LAB — POCT GLYCOSYLATED HEMOGLOBIN (HGB A1C)
HbA1c POC (<> result, manual entry): 7.7 % (ref 4.0–5.6)
HbA1c, POC (controlled diabetic range): 7.7 % — AB (ref 0.0–7.0)
HbA1c, POC (prediabetic range): 7.7 % — AB (ref 5.7–6.4)
Hemoglobin A1C: 7.7 % — AB (ref 4.0–5.6)

## 2020-07-06 MED ORDER — CETIRIZINE HCL 10 MG PO TABS
10.0000 mg | ORAL_TABLET | Freq: Every day | ORAL | 11 refills | Status: DC
Start: 2020-07-06 — End: 2020-12-08

## 2020-07-06 MED ORDER — LOSARTAN POTASSIUM 100 MG PO TABS: 100.0000 mg | ORAL_TABLET | Freq: Every day | ORAL | 1 refills | Status: DC

## 2020-07-06 MED ORDER — DAPAGLIFLOZIN PROPANEDIOL 5 MG PO TABS: 5.0000 mg | ORAL_TABLET | Freq: Every day | ORAL | 1 refills | Status: DC

## 2020-07-06 MED ORDER — EZETIMIBE 10 MG PO TABS: 10.0000 mg | ORAL_TABLET | Freq: Every day | ORAL | 3 refills | Status: DC

## 2020-07-06 MED ORDER — AMLODIPINE BESYLATE 5 MG PO TABS: 5.0000 mg | ORAL_TABLET | Freq: Every day | ORAL | 1 refills | Status: DC

## 2020-07-06 MED ORDER — CYCLOBENZAPRINE HCL 10 MG PO TABS: ORAL_TABLET | ORAL | 5 refills | Status: DC

## 2020-07-06 MED ORDER — MONTELUKAST SODIUM 10 MG PO TABS: 10.0000 mg | ORAL_TABLET | Freq: Every day | ORAL | 3 refills | Status: DC

## 2020-07-06 MED ORDER — RIVAROXABAN 10 MG PO TABS: 10.0000 mg | ORAL_TABLET | Freq: Every day | ORAL | 1 refills | Status: DC

## 2020-07-06 MED ORDER — GABAPENTIN 300 MG PO CAPS: ORAL_CAPSULE | ORAL | 2 refills | Status: DC

## 2020-07-06 MED ORDER — HYDROCHLOROTHIAZIDE 25 MG PO TABS: 25.0000 mg | ORAL_TABLET | Freq: Every day | ORAL | 1 refills | Status: DC

## 2020-07-06 NOTE — Patient Instructions (Addendum)
We added Farxiga to your diabetic regimen.  Next appt 3-4 mos   Diabetes Mellitus and Nutrition, Adult When you have diabetes (diabetes mellitus), it is very important to have healthy eating habits because your blood sugar (glucose) levels are greatly affected by what you eat and drink. Eating healthy foods in the appropriate amounts, at about the same times every day, can help you:  Control your blood glucose.  Lower your risk of heart disease.  Improve your blood pressure.  Reach or maintain a healthy weight. Every person with diabetes is different, and each person has different needs for a meal plan. Your health care provider may recommend that you work with a diet and nutrition specialist (dietitian) to make a meal plan that is best for you. Your meal plan may vary depending on factors such as:  The calories you need.  The medicines you take.  Your weight.  Your blood glucose, blood pressure, and cholesterol levels.  Your activity level.  Other health conditions you have, such as heart or kidney disease. How do carbohydrates affect me? Carbohydrates, also called carbs, affect your blood glucose level more than any other type of food. Eating carbs naturally raises the amount of glucose in your blood. Carb counting is a method for keeping track of how many carbs you eat. Counting carbs is important to keep your blood glucose at a healthy level, especially if you use insulin or take certain oral diabetes medicines. It is important to know how many carbs you can safely have in each meal. This is different for every person. Your dietitian can help you calculate how many carbs you should have at each meal and for each snack. Foods that contain carbs include:  Bread, cereal, rice, pasta, and crackers.  Potatoes and corn.  Peas, beans, and lentils.  Milk and yogurt.  Fruit and juice.  Desserts, such as cakes, cookies, ice cream, and candy. How does alcohol affect  me? Alcohol can cause a sudden decrease in blood glucose (hypoglycemia), especially if you use insulin or take certain oral diabetes medicines. Hypoglycemia can be a life-threatening condition. Symptoms of hypoglycemia (sleepiness, dizziness, and confusion) are similar to symptoms of having too much alcohol. If your health care provider says that alcohol is safe for you, follow these guidelines:  Limit alcohol intake to no more than 1 drink per day for nonpregnant women and 2 drinks per day for men. One drink equals 12 oz of beer, 5 oz of wine, or 1 oz of hard liquor.  Do not drink on an empty stomach.  Keep yourself hydrated with water, diet soda, or unsweetened iced tea.  Keep in mind that regular soda, juice, and other mixers may contain a lot of sugar and must be counted as carbs. What are tips for following this plan?  Reading food labels  Start by checking the serving size on the "Nutrition Facts" label of packaged foods and drinks. The amount of calories, carbs, fats, and other nutrients listed on the label is based on one serving of the item. Many items contain more than one serving per package.  Check the total grams (g) of carbs in one serving. You can calculate the number of servings of carbs in one serving by dividing the total carbs by 15. For example, if a food has 30 g of total carbs, it would be equal to 2 servings of carbs.  Check the number of grams (g) of saturated and trans fats in one serving.  Choose foods that have low or no amount of these fats.  Check the number of milligrams (mg) of salt (sodium) in one serving. Most people should limit total sodium intake to less than 2,300 mg per day.  Always check the nutrition information of foods labeled as "low-fat" or "nonfat". These foods may be higher in added sugar or refined carbs and should be avoided.  Talk to your dietitian to identify your daily goals for nutrients listed on the label. Shopping  Avoid buying  canned, premade, or processed foods. These foods tend to be high in fat, sodium, and added sugar.  Shop around the outside edge of the grocery store. This includes fresh fruits and vegetables, bulk grains, fresh meats, and fresh dairy. Cooking  Use low-heat cooking methods, such as baking, instead of high-heat cooking methods like deep frying.  Cook using healthy oils, such as olive, canola, or sunflower oil.  Avoid cooking with butter, cream, or high-fat meats. Meal planning  Eat meals and snacks regularly, preferably at the same times every day. Avoid going long periods of time without eating.  Eat foods high in fiber, such as fresh fruits, vegetables, beans, and whole grains. Talk to your dietitian about how many servings of carbs you can eat at each meal.  Eat 4-6 ounces (oz) of lean protein each day, such as lean meat, chicken, fish, eggs, or tofu. One oz of lean protein is equal to: ? 1 oz of meat, chicken, or fish. ? 1 egg. ?  cup of tofu.  Eat some foods each day that contain healthy fats, such as avocado, nuts, seeds, and fish. Lifestyle  Check your blood glucose regularly.  Exercise regularly as told by your health care provider. This may include: ? 150 minutes of moderate-intensity or vigorous-intensity exercise each week. This could be brisk walking, biking, or water aerobics. ? Stretching and doing strength exercises, such as yoga or weightlifting, at least 2 times a week.  Take medicines as told by your health care provider.  Do not use any products that contain nicotine or tobacco, such as cigarettes and e-cigarettes. If you need help quitting, ask your health care provider.  Work with a Social worker or diabetes educator to identify strategies to manage stress and any emotional and social challenges. Questions to ask a health care provider  Do I need to meet with a diabetes educator?  Do I need to meet with a dietitian?  What number can I call if I have  questions?  When are the best times to check my blood glucose? Where to find more information:  American Diabetes Association: diabetes.org  Academy of Nutrition and Dietetics: www.eatright.[employer] of Diabetes and Digestive and Kidney Diseases (NIH): [employer] Summary  A healthy meal plan will help you control your blood glucose and maintain a healthy lifestyle.  Working with a diet and nutrition specialist (dietitian) can help you make a meal plan that is best for you.  Keep in mind that carbohydrates (carbs) and alcohol have immediate effects on your blood glucose levels. It is important to count carbs and to use alcohol carefully. This information is not intended to replace advice given to you by your health care provider. Make sure you discuss any questions you have with your health care provider. Document Revised: 06/21/2017 Document Reviewed: 08/13/2016 Elsevier Patient Education  2020 [employer].

## 2020-07-06 NOTE — Progress Notes (Signed)
This visit occurred during the SARS-CoV-2 public health emergency.  Safety protocols were in place, including screening questions prior to the visit, additional usage of staff PPE, and extensive cleaning of exam room while observing appropriate contact time as indicated for disinfecting solutions.    Adrian [name] , [birth year], 59 y.o., male MRN: [id number] Patient Care Team    Relationship Specialty Notifications Start End  Adrian Hillock, DO PCP - General Family Medicine  02/06/18   Adrian Rockers, MD Consulting Physician Pulmonary Disease  02/07/18   Adrian [name], OD Referring Physician   02/07/18   Adrian Bullion, DO Consulting Physician Gastroenterology  01/20/19     Chief Complaint  Patient presents with  . Follow-up    [facility]; pt is fasting      Subjective: Adrian [name] is a 59 y.o.  Essential hypertension/hyperlipidemia/morbid obesity/CKD3 Pt reports  compliance  with losartan 179m QD, amlodipine 5 mg and HCTZ 25 mg daily. Blood pressures ranges at home normal range.Patient denies chest pain, shortness of breath, dizziness or lower extremity edema.  Pt is not taking a daily baby ASA. Pt is not prescribed statin 2/2 intolerance x2. Compliance with zeita and tolerating. Labs due next appointment Diet: low Salt Exercise: Routine exercise RF: Hypertension, hyperlipidemia, obesity, diabetes.   Type 2 diabetes mellitus without complication, without long-term current use of insulin ([place] reports compliance with metformin 1000 mg daily daily. Patient denies dizziness, hyperglycemic or hypoglycemic events. Patient denies dizziness, hyperglycemic or hypoglycemic events. Patient denies numbness, tingling in the extremities or nonhealing wounds of feet.  He states he has cut back on his sugar consumption. He has gained 4 lbs. He is prescribed gabapentin 300 mg nightly and reports it is very helpful-mostly for back/leg pain. Diabetes diagnosed 2019 PNA series:  Pneumonia series completed Flu shot: Up-to-date (recommneded yearly) Microalbumin: on ARB Foot exam: Completed 03/2020 Eye exam: 04/2020 Dr. BOswaldo ConroyA1c: 6.6 >>5.8>>6.3>> 7.2>7.1> 8.9 >7.7 today  Asthma: Patient reports his asthma has been rather stable.  Not needing albuterol breakthrough.Compliant with Pulmicort and Singulair nightly. He is not taking a daily antihistamine.    Depression screen [facility]2/9 07/06/2020 04/07/2020 06/04/2019 09/25/2018 08/19/2018  Decreased Interest 0 0 0 0 0  Down, Depressed, Hopeless 0 0 0 0 0  PHQ - 2 Score 0 0 0 0 0    Allergies  Allergen Reactions  . Atorvastatin     Other reaction(s): myalgias  . Crestor [Rosuvastatin]     Other reaction(s): severe foot pains  . Lisinopril Cough    Other reaction(s): cough  . Statins Other (See Comments)    Causes pain/ foot trouble   Social History   Social History Narrative   Marital status/children/pets: Married.   Education/employment: Associates degree, employed as a fArmed forces operational officer      -Wears a bicycle helmet riding a bike: No     -smoke alarm in the home:Yes     - wears seatbelt: Yes     - Feels safe in their relationships: Yes   Past Medical History:  Diagnosis Date  . Adenomatous colon polyp    FS 02/2004; colo 2005 hyperplastic polyp; 2010 neg - butmucosal prolapse.   . Allergy   . Asthma   . Blood in stool   . Chicken pox   . Colon polyps   . Diabetes mellitus without complication ([place] 066/4403 . Diverticulosis   . Frequent headaches   . Genital warts   .  GERD (gastroesophageal reflux disease)   . Hemorrhoid   . Hepatic steatosis 04/2019   Elevated liver enzymes-full work-up for infectious disease process with ultrasound normal with the exception of hepatic steatosis  . Hyperlipidemia   . Hypertension   . Hypogonadism in male    labs x2: 11/14/2015 - 216 and 11/17/2015 193; declined med at that time.   . Insomnia   . OSA on CPAP 09/18/2016   HST 09/18/2016; ESS-4; AHI-76;  O2 min- 61%  . Pulmonary embolism ([place]) 01/2017   provoked by long care ride, on xarelto, est Adrian [name]  . Thyroid disease    subclinical hypothyroidism per records   Past Surgical History:  Procedure Laterality Date  . ANKLE SURGERY    . COLONOSCOPY  2016   dr Adrian [name]   . ESOPHAGOGASTRODUODENOSCOPY     with dr Adrian [name]    Family History  Problem Relation Age of Onset  . Asthma Mother   . Arthritis Mother   . Allergies Brother   . Alcohol abuse Maternal Grandmother   . Cancer Maternal Grandmother   . Hiatal hernia Maternal Grandfather   . Colon cancer Neg Hx   . Esophageal cancer Neg Hx    Allergies as of 07/06/2020      Reactions   Atorvastatin    Other reaction(s): myalgias   Crestor [rosuvastatin]    Other reaction(s): severe foot pains   Lisinopril Cough   Other reaction(s): cough   Statins Other (See Comments)   Causes pain/ foot trouble      Medication List       Accurate as of July 06, 2020  2:29 PM. If you have any questions, ask your nurse or doctor.        amLODipine 5 MG tablet Commonly known as: NORVASC Take 1 tablet (5 mg total) by mouth daily.   budesonide 180 MCG/ACT inhaler Commonly known as: PULMICORT Inhale 2 puffs into the lungs in the morning and at bedtime.   cetirizine 10 MG tablet Commonly known as: ZYRTEC Take 1 tablet (10 mg total) by mouth daily.   cyclobenzaprine 10 MG tablet Commonly known as: FLEXERIL TAKE 1 TABLET BY MOUTH EVERYDAY AT BEDTIME PRN   dapagliflozin propanediol 5 MG Tabs tablet Commonly known as: Farxiga Take 1 tablet (5 mg total) by mouth daily before breakfast. Started by: Adrian Pouch, DO   ezetimibe 10 MG tablet Commonly known as: ZETIA Take 1 tablet (10 mg total) by mouth daily.   fluticasone 50 MCG/ACT nasal spray Commonly known as: FLONASE Place into both nostrils daily.   gabapentin 300 MG capsule Commonly known as: NEURONTIN TAKE 1 CAPSULE BY MOUTH EVERYDAY AT BEDTIME    hydrochlorothiazide 25 MG tablet Commonly known as: HYDRODIURIL Take 1 tablet (25 mg total) by mouth daily.   losartan 100 MG tablet Commonly known as: COZAAR Take 1 tablet (100 mg total) by mouth daily.   metFORMIN 1000 MG tablet Commonly known as: GLUCOPHAGE Take 1 tablet (1,000 mg total) by mouth 2 (two) times daily with a meal.   montelukast 10 MG tablet Commonly known as: SINGULAIR Take 1 tablet (10 mg total) by mouth at bedtime.   rivaroxaban 10 MG Tabs tablet Commonly known as: Xarelto Take 1 tablet (10 mg total) by mouth daily.   UNABLE TO FIND Med Name: CPAP       All past medical history, surgical history, allergies, family history, immunizations andmedications were updated in the EMR today and reviewed under the history and medication  portions of their EMR.     ROS: Negative, with the exception of above mentioned in HPI   Objective:  BP 134/76   Pulse 72   Temp 98.6 F (37 C) (Oral)   Ht '6\' 1"'  (1.854 m)   Wt (!) 364 lb (165.1 kg)   SpO2 95%   BMI 48.02 kg/m  Body mass index is 48.02 kg/m. Gen: Afebrile. No acute distress. Nontoxic, pleasant obese male.  HENT: AT. [place].  Eyes:Pupils Equal Round Reactive to light, Extraocular movements intact,  Conjunctiva without redness, discharge or icterus. Neck/lymp/endocrine: Supple,no lymphadenopathy, no thyromegaly CV: RRR no murmur, no edema, +2/4 P posterior tibialis pulses Chest: CTAB, no wheeze or crackles Abd: Soft. obese. NTND. BS present. no Masses palpated.  Skin: no rashes, purpura or petechiae.  Neuro:  Normal gait. PERLA. EOMi. Alert. Oriented x3  Psych: Normal affect, dress and demeanor. Normal speech. Normal thought content and judgment.   No exam data present No results found. Results for orders placed or performed in visit on 07/06/20 (from the past 24 hour(s))  POCT HgB A1C     Status: Abnormal   Collection Time: 07/06/20  2:08 PM  Result Value Ref Range   Hemoglobin A1C 7.7 (A) 4.0 - 5.6 %    HbA1c POC (<> result, manual entry) 7.7 4.0 - 5.6 %   HbA1c, POC (prediabetic range) 7.7 (A) 5.7 - 6.4 %   HbA1c, POC (controlled diabetic range) 7.7 (A) 0.0 - 7.0 %    Assessment/Plan: Adrian [name] is a 59 y.o. male present for OV for  Elevated liver enzymes/Hepatic steatosis -cute hepatitis and autoimmune hepatitis, HIV>negative -Abdominal ultrasound > hepatic steatosis.  -Use Flexeril if needed, since stopping the medicine did not change his liver enzymes.  -L FTs have been stable we will collect with routine labs yearly> cmp collected today  Type 2 diabetes mellitus without complication, without long-term current use of insulin (HCC) Improved but still above goal -A1c goal less than 7, and closer to 6.5 desired. - continue metformin 1000 milligrams to twice daily dosing.  - start farxiga 5 mg qd Continue diet and exercise modifications PNA series: Pneumonia series completed Flu shot: Up-to-date (recommneded yearly) Microalbumin: on ARB Foot exam: Completed 03/2020 Eye exam: 04/2020 Dr. Oswaldo [name] A1c: 6.6 >>5.8>>6.3>> 7.2>7.1> 8.9 >7.7 today   Mild persistent asthma without complication -Stable.  -Continue Pulmicort 1-2 puffs BID.(fornualry changed from Asmanex) Continue Singulair 10 mg daily.  Continue Zyrtec 10 mg nightly  Chronic thoracic back pain: Stable.  Continue gabapentin nightly  Continue  Flexeril 10 mg nightly as needed.    Essential hypertension/morbid obesity/HLD/Aortic athersclerosis - stable - Goal :130/80 with is DM history.  -continue  losartan 100 mg daily. -continue  HCTZ 25 -continue  amlodipine 5 mg daily -continue  Zetia 10 mg daily Low sodium diet Exercise.      Reviewed expectations re: course of current medical issues.  Discussed self-management of symptoms.  Outlined signs and symptoms indicating need for more acute intervention.  Patient verbalized understanding and all questions were answered.  Patient received an  After-Visit Summary.    Orders Placed This Encounter  Procedures  . CBC w/Diff  . Comp Met (CMET)  . TSH  . Lipid panel  . Vitamin D (25 hydroxy)  . POCT HgB A1C   Meds ordered this encounter  Medications  . hydrochlorothiazide (HYDRODIURIL) 25 MG tablet    Sig: Take 1 tablet (25 mg total) by mouth daily.    Dispense:  90 tablet    Refill:  1  . losartan (COZAAR) 100 MG tablet    Sig: Take 1 tablet (100 mg total) by mouth daily.    Dispense:  90 tablet    Refill:  1    Please dc all other losartan scripts and remind pt of changes and only take one of these now. Thank you so much!  Marland Kitchen gabapentin (NEURONTIN) 300 MG capsule    Sig: TAKE 1 CAPSULE BY MOUTH EVERYDAY AT BEDTIME    Dispense:  90 capsule    Refill:  2  . ezetimibe (ZETIA) 10 MG tablet    Sig: Take 1 tablet (10 mg total) by mouth daily.    Dispense:  90 tablet    Refill:  3  . rivaroxaban (XARELTO) 10 MG TABS tablet    Sig: Take 1 tablet (10 mg total) by mouth daily.    Dispense:  90 tablet    Refill:  1  . montelukast (SINGULAIR) 10 MG tablet    Sig: Take 1 tablet (10 mg total) by mouth at bedtime.    Dispense:  90 tablet    Refill:  3  . cyclobenzaprine (FLEXERIL) 10 MG tablet    Sig: TAKE 1 TABLET BY MOUTH EVERYDAY AT BEDTIME PRN    Dispense:  30 tablet    Refill:  5  . cetirizine (ZYRTEC) 10 MG tablet    Sig: Take 1 tablet (10 mg total) by mouth daily.    Dispense:  30 tablet    Refill:  11  . amLODipine (NORVASC) 5 MG tablet    Sig: Take 1 tablet (5 mg total) by mouth daily.    Dispense:  90 tablet    Refill:  1  . dapagliflozin propanediol (FARXIGA) 5 MG TABS tablet    Sig: Take 1 tablet (5 mg total) by mouth daily before breakfast.    Dispense:  90 tablet    Refill:  1   Referral Orders  No referral(s) requested today     Note is dictated utilizing voice recognition software. Although note has been proof read prior to signing, occasional typographical errors still can be missed. If any  questions arise, please do not hesitate to call for verification.   electronically signed by:  Adrian Pouch, DO  [place]

## 2020-07-07 ENCOUNTER — Telehealth: Payer: Self-pay | Admitting: Family Medicine

## 2020-07-07 DIAGNOSIS — E119 Type 2 diabetes mellitus without complications: Secondary | ICD-10-CM

## 2020-07-07 LAB — LIPID PANEL
Cholesterol: 196 mg/dL (ref <200)
HDL: 36 mg/dL — ABNORMAL LOW (ref >=40)
LDL Cholesterol (Calc): 131 mg/dL (calc) — ABNORMAL HIGH
Non-HDL Cholesterol (Calc): 160 mg/dL (calc) — ABNORMAL HIGH (ref <130)
Total CHOL/HDL Ratio: 5.4 (calc) — ABNORMAL HIGH (ref <5.0)
Triglycerides: 156 mg/dL — ABNORMAL HIGH (ref <150)

## 2020-07-07 LAB — COMPREHENSIVE METABOLIC PANEL
AG Ratio: 1.2 (calc) (ref 1.0–2.5)
ALT: 69 U/L — ABNORMAL HIGH (ref 9–46)
AST: 47 U/L — ABNORMAL HIGH (ref 10–35)
Albumin: 3.8 g/dL (ref 3.6–5.1)
Alkaline phosphatase (APISO): 68 U/L (ref 35–144)
BUN: 14 mg/dL (ref 7–25)
CO2: 25 mmol/L (ref 20–32)
Calcium: 9.8 mg/dL (ref 8.6–10.3)
Chloride: 102 mmol/L (ref 98–110)
Creat: 0.94 mg/dL (ref 0.70–1.33)
Globulin: 3.3 g/dL (calc) (ref 1.9–3.7)
Glucose, Bld: 192 mg/dL — ABNORMAL HIGH (ref 65–99)
Potassium: 4.2 mmol/L (ref 3.5–5.3)
Sodium: 138 mmol/L (ref 135–146)
Total Bilirubin: 0.4 mg/dL (ref 0.2–1.2)
Total Protein: 7.1 g/dL (ref 6.1–8.1)

## 2020-07-07 LAB — CBC WITH DIFFERENTIAL/PLATELET
Absolute Monocytes: 630 cells/uL (ref 200–950)
Basophils Absolute: 38 cells/uL (ref 0–200)
Basophils Relative: 0.5 %
Eosinophils Absolute: 0 cells/uL — ABNORMAL LOW (ref 15–500)
Eosinophils Relative: 0 %
HCT: 45.2 % (ref 38.5–50.0)
Hemoglobin: 15.6 g/dL (ref 13.2–17.1)
Lymphs Abs: 1530 cells/uL (ref 850–3900)
MCH: 30.4 pg (ref 27.0–33.0)
MCHC: 34.5 g/dL (ref 32.0–36.0)
MCV: 87.9 fL (ref 80.0–100.0)
MPV: 10.8 fL (ref 7.5–12.5)
Monocytes Relative: 8.4 %
Neutro Abs: 5303 cells/uL (ref 1500–7800)
Neutrophils Relative %: 70.7 %
Platelets: 264 10*3/uL (ref 140–400)
RBC: 5.14 10*6/uL (ref 4.20–5.80)
RDW: 12.1 % (ref 11.0–15.0)
Total Lymphocyte: 20.4 %
WBC: 7.5 10*3/uL (ref 3.8–10.8)

## 2020-07-07 LAB — TSH: TSH: 3.96 mIU/L (ref 0.40–4.50)

## 2020-07-07 LAB — VITAMIN D 25 HYDROXY (VIT D DEFICIENCY, FRACTURES): Vit D, 25-Hydroxy: 22 ng/mL — ABNORMAL LOW (ref 30–100)

## 2020-07-07 MED ORDER — METFORMIN HCL 1000 MG PO TABS
1000.0000 mg | ORAL_TABLET | Freq: Two times a day (BID) | ORAL | 1 refills | Status: DC
Start: 2020-07-07 — End: 2020-12-08

## 2020-07-07 MED ORDER — VITAMIN D (ERGOCALCIFEROL) 1.25 MG (50000 UNIT) PO CAPS: 50000.0000 [IU] | ORAL_CAPSULE | ORAL | 0 refills | Status: DC

## 2020-07-07 NOTE — Telephone Encounter (Signed)
LVM for pt to CB regarding results.  

## 2020-07-07 NOTE — Telephone Encounter (Signed)
Please call patient -kidney and thyroid function are normal -Liver function is stable -Blood cell counts and electrolytes are normal -Cholesterol panel is okay, with the exception of his LDL/bad cholesterol is creeping back up and is now 131.  With his history of diabetes and hypertension it is very important we try to keep this less than 100.  Continue the Zetia and increase fiber by taking Metamucil daily.  If still above 100 on next appointment, we will need to consider additional coverage for his cholesterol which would be an injection every 2 weeks. -Vitamin D levels are low at 22-I have called in a once weekly vitamin D supplementation.  He should also start an over-the-counter vitamin D 1000 units daily and continue even after the once weekly medication is completed.

## 2020-07-07 NOTE — Telephone Encounter (Signed)
Spoke with pt regarding labs and instructions.   

## 2020-07-26 ENCOUNTER — Other Ambulatory Visit: Payer: Self-pay | Admitting: Family Medicine

## 2020-08-22 DIAGNOSIS — G4733 Obstructive sleep apnea (adult) (pediatric): Secondary | ICD-10-CM | POA: Diagnosis not present

## 2020-08-24 ENCOUNTER — Other Ambulatory Visit (HOSPITAL_BASED_OUTPATIENT_CLINIC_OR_DEPARTMENT_OTHER): Payer: Self-pay

## 2020-08-24 DIAGNOSIS — G4733 Obstructive sleep apnea (adult) (pediatric): Secondary | ICD-10-CM

## 2020-08-28 ENCOUNTER — Ambulatory Visit (HOSPITAL_BASED_OUTPATIENT_CLINIC_OR_DEPARTMENT_OTHER): Payer: Federal, State, Local not specified - PPO | Attending: Internal Medicine | Admitting: Internal Medicine

## 2020-08-28 ENCOUNTER — Other Ambulatory Visit: Payer: Self-pay

## 2020-08-28 DIAGNOSIS — Z9989 Dependence on other enabling machines and devices: Secondary | ICD-10-CM | POA: Diagnosis not present

## 2020-08-28 DIAGNOSIS — I10 Essential (primary) hypertension: Secondary | ICD-10-CM | POA: Diagnosis not present

## 2020-08-28 DIAGNOSIS — Z79899 Other long term (current) drug therapy: Secondary | ICD-10-CM | POA: Insufficient documentation

## 2020-08-28 DIAGNOSIS — G4733 Obstructive sleep apnea (adult) (pediatric): Secondary | ICD-10-CM | POA: Diagnosis not present

## 2020-08-28 DIAGNOSIS — E119 Type 2 diabetes mellitus without complications: Secondary | ICD-10-CM | POA: Diagnosis not present

## 2020-08-30 DIAGNOSIS — G4733 Obstructive sleep apnea (adult) (pediatric): Secondary | ICD-10-CM | POA: Diagnosis not present

## 2020-08-30 NOTE — Procedures (Signed)
   NAME: Adrian [name] DATE OF BIRTH:  1960-11-08 MEDICAL RECORD NUMBER [id number]  LOCATION: [facility]  PHYSICIAN: Marius Ditch  DATE OF STUDY: 08/28/2020  SLEEP STUDY TYPE: Positive Airway Pressure Titration               REFERRING PHYSICIAN: Marius Ditch, MD  EPWORTH SLEEPINESS SCORE:  Not available HEIGHT: 6\' 1"  (185.4 cm)  WEIGHT: (!) 360 lb (163.3 kg)    Body mass index is 47.5 kg/m.  NECK SIZE: 21 in.  CLINICAL INFORMATION The patient was referred to the sleep center for evaluation of G47.33 OSA: Adult and Pediatric (327.23). Indications include Asthma, Diabetes (249.00), Hypertension (401.9). Most recent titration study dated 06/12/2020 revealed an AHI of 0.0/h at BPAP 24/20. However, the patient was unable to tolerate the pressure required to keep his AHI and O2 sats adequate without excessive GI air, bloating and pain.   MEDICATIONS Patient self administered medications include: pulmicort flexhaler, CETIRIZINE, SINGULAIR, GABAPENTIN, citirizine. No sleep medicine administered.Marland Kitchen  SLEEP STUDY TECHNIQUE The patient underwent an attended overnight polysomnography titration to assess the effects of cpap therapy. The following variables were monitored: EEG(C4-A1, C3-A2, O1-A2, O2-A1), EOG, submental and leg EMG, ECG, oxyhemoglobin saturation by pulse oximetry, thoracic and abdominal respiratory effort belts, nasal/oral airflow by pressure sensor, body position sensor and snoring sensor. CPAP pressure was titrated to eliminate apneas, hypopneas and oxygen desaturation.  TECHNICAL COMMENTS Comments added by Technician: O2 initiated due to low sats. Comments added by Scorer: N/A  SLEEP ARCHITECTURE The study was initiated at 10:45:41 PM and terminated at 5:17:46 AM. Total recorded time was 392.1 minutes. EEG confirmed total sleep time was 298.5 minutes yielding a sleep efficiency of 76.1%%. Sleep onset after lights out was 22.9 minutes with a REM  latency of 158.5 minutes. The patient spent 11.2%% of the night in stage N1 sleep, 69.8%% in stage N2 sleep, 0.0%% in stage N3 and 18.9% in REM. The Arousal Index was 10.7/hour.  RESPIRATORY PARAMETERS The overall AHI was 3.2 per hour, and the RDI was 5.8 events/hour with a central apnea index of 0.0per hour. The patient's CPAP was limited to IPAP/EPAP 12/12 cm H2O. Oxygen was then applied. 1PLM of oxygen relieved hypoxemia in NREM sleep; 2LPM of oxygen improved but did not abolish hypoxemia in REM sleep.   LEG MOVEMENT DATA The total leg movements were 0 with a resulting leg movement index of 0.0/hr. Associated arousal with leg movement index was 0.0/hr.  CARDIAC DATA The underlying cardiac rhythm was most consistent with sinus rhythm. Mean heart rate during sleep was 57.7 bpm. Additional rhythm abnormalities include PVCs.  IMPRESSIONS - Severe Obstructive Sleep Apnea (CSA) - Patient tolerated CPAP 12 and most events were resolved.  - 2 LPM oxygen required to solve most hypoxemia  DIAGNOSIS - Obstructive Sleep Apnea (G47.33)  RECOMMENDATIONS - CPAP therapy of 12 cm H2O and oxygen at 2 LPM.  Marius Ditch Sleep specialist, American Board of Internal Medicine  ELECTRONICALLY SIGNED ON:  08/30/2020, 6:11 PM [place] PH: [phone]   FX: [phone] [place]

## 2020-09-27 DIAGNOSIS — G4733 Obstructive sleep apnea (adult) (pediatric): Secondary | ICD-10-CM | POA: Diagnosis not present

## 2020-10-17 ENCOUNTER — Ambulatory Visit: Payer: Federal, State, Local not specified - PPO | Admitting: Family Medicine

## 2020-10-28 DIAGNOSIS — G4733 Obstructive sleep apnea (adult) (pediatric): Secondary | ICD-10-CM | POA: Diagnosis not present

## 2020-11-17 ENCOUNTER — Ambulatory Visit: Payer: Federal, State, Local not specified - PPO | Admitting: Family Medicine

## 2020-11-17 DIAGNOSIS — G8929 Other chronic pain: Secondary | ICD-10-CM

## 2020-11-17 DIAGNOSIS — I700 Atherosclerosis of aorta: Secondary | ICD-10-CM

## 2020-11-17 DIAGNOSIS — E119 Type 2 diabetes mellitus without complications: Secondary | ICD-10-CM

## 2020-11-17 DIAGNOSIS — I10 Essential (primary) hypertension: Secondary | ICD-10-CM

## 2020-11-17 DIAGNOSIS — J4530 Mild persistent asthma, uncomplicated: Secondary | ICD-10-CM

## 2020-11-17 DIAGNOSIS — R748 Abnormal levels of other serum enzymes: Secondary | ICD-10-CM

## 2020-11-17 DIAGNOSIS — E782 Mixed hyperlipidemia: Secondary | ICD-10-CM

## 2020-11-21 DIAGNOSIS — G4733 Obstructive sleep apnea (adult) (pediatric): Secondary | ICD-10-CM | POA: Diagnosis not present

## 2020-11-27 DIAGNOSIS — G4733 Obstructive sleep apnea (adult) (pediatric): Secondary | ICD-10-CM | POA: Diagnosis not present

## 2020-12-08 ENCOUNTER — Telehealth: Payer: Self-pay | Admitting: Family Medicine

## 2020-12-08 ENCOUNTER — Ambulatory Visit: Payer: Federal, State, Local not specified - PPO | Admitting: Family Medicine

## 2020-12-08 ENCOUNTER — Other Ambulatory Visit: Payer: Self-pay

## 2020-12-08 ENCOUNTER — Encounter: Payer: Self-pay | Admitting: Family Medicine

## 2020-12-08 VITALS — BP 127/83 | HR 69 | Temp 98.5°F | Ht 73.0 in | Wt 356.0 lb

## 2020-12-08 DIAGNOSIS — M546 Pain in thoracic spine: Secondary | ICD-10-CM | POA: Diagnosis not present

## 2020-12-08 DIAGNOSIS — I10 Essential (primary) hypertension: Secondary | ICD-10-CM

## 2020-12-08 DIAGNOSIS — E119 Type 2 diabetes mellitus without complications: Secondary | ICD-10-CM

## 2020-12-08 DIAGNOSIS — I272 Pulmonary hypertension, unspecified: Secondary | ICD-10-CM

## 2020-12-08 DIAGNOSIS — Z86711 Personal history of pulmonary embolism: Secondary | ICD-10-CM

## 2020-12-08 DIAGNOSIS — E559 Vitamin D deficiency, unspecified: Secondary | ICD-10-CM

## 2020-12-08 DIAGNOSIS — G4734 Idiopathic sleep related nonobstructive alveolar hypoventilation: Secondary | ICD-10-CM

## 2020-12-08 DIAGNOSIS — Z7901 Long term (current) use of anticoagulants: Secondary | ICD-10-CM

## 2020-12-08 DIAGNOSIS — J4530 Mild persistent asthma, uncomplicated: Secondary | ICD-10-CM | POA: Diagnosis not present

## 2020-12-08 DIAGNOSIS — G8929 Other chronic pain: Secondary | ICD-10-CM

## 2020-12-08 DIAGNOSIS — E782 Mixed hyperlipidemia: Secondary | ICD-10-CM

## 2020-12-08 DIAGNOSIS — R748 Abnormal levels of other serum enzymes: Secondary | ICD-10-CM

## 2020-12-08 DIAGNOSIS — I700 Atherosclerosis of aorta: Secondary | ICD-10-CM

## 2020-12-08 LAB — BASIC METABOLIC PANEL
BUN: 13 mg/dL (ref 6–23)
CO2: 29 mEq/L (ref 19–32)
Calcium: 11.1 mg/dL — ABNORMAL HIGH (ref 8.4–10.5)
Chloride: 100 mEq/L (ref 96–112)
Creatinine, Ser: 1 mg/dL (ref 0.40–1.50)
GFR: 82.57 mL/min (ref >60.00)
Glucose, Bld: 141 mg/dL — ABNORMAL HIGH (ref 70–99)
Potassium: 4.3 mEq/L (ref 3.5–5.1)
Sodium: 138 mEq/L (ref 135–145)

## 2020-12-08 LAB — CBC
HCT: 45.8 % (ref 39.0–52.0)
Hemoglobin: 15.7 g/dL (ref 13.0–17.0)
MCHC: 34.2 g/dL (ref 30.0–36.0)
MCV: 88.9 fl (ref 78.0–100.0)
Platelets: 257 10*3/uL (ref 150.0–400.0)
RBC: 5.15 Mil/uL (ref 4.22–5.81)
RDW: 13.5 % (ref 11.5–15.5)
WBC: 7.5 10*3/uL (ref 4.0–10.5)

## 2020-12-08 LAB — VITAMIN D 25 HYDROXY (VIT D DEFICIENCY, FRACTURES): VITD: 38.39 ng/mL (ref 30.00–100.00)

## 2020-12-08 LAB — POCT GLYCOSYLATED HEMOGLOBIN (HGB A1C)
HbA1c POC (<> result, manual entry): 6.8 % (ref 4.0–5.6)
HbA1c, POC (controlled diabetic range): 6.8 % (ref 0.0–7.0)
HbA1c, POC (prediabetic range): 6.8 % — AB (ref 5.7–6.4)
Hemoglobin A1C: 6.8 % — AB (ref 4.0–5.6)

## 2020-12-08 LAB — LDL CHOLESTEROL, DIRECT: Direct LDL: 141 mg/dL

## 2020-12-08 MED ORDER — EZETIMIBE 10 MG PO TABS: 10.0000 mg | ORAL_TABLET | Freq: Every day | ORAL | 3 refills | Status: DC

## 2020-12-08 MED ORDER — LOSARTAN POTASSIUM 100 MG PO TABS: 100.0000 mg | ORAL_TABLET | Freq: Every day | ORAL | 1 refills | Status: DC

## 2020-12-08 MED ORDER — RIVAROXABAN 10 MG PO TABS: 10.0000 mg | ORAL_TABLET | Freq: Every day | ORAL | 1 refills | Status: DC

## 2020-12-08 MED ORDER — CYCLOBENZAPRINE HCL 10 MG PO TABS: ORAL_TABLET | ORAL | 5 refills | Status: DC

## 2020-12-08 MED ORDER — HYDROCHLOROTHIAZIDE 25 MG PO TABS
25.0000 mg | ORAL_TABLET | Freq: Every day | ORAL | 1 refills | Status: DC
Start: 2020-12-08 — End: 2021-06-08

## 2020-12-08 MED ORDER — BUDESONIDE 180 MCG/ACT IN AEPB: 2.0000 | INHALATION_SPRAY | Freq: Two times a day (BID) | RESPIRATORY_TRACT | 11 refills | Status: DC

## 2020-12-08 MED ORDER — GABAPENTIN 300 MG PO CAPS: ORAL_CAPSULE | ORAL | 2 refills | Status: DC

## 2020-12-08 MED ORDER — METFORMIN HCL 1000 MG PO TABS
1000.0000 mg | ORAL_TABLET | Freq: Two times a day (BID) | ORAL | 1 refills | Status: DC
Start: 2020-12-08 — End: 2021-06-08

## 2020-12-08 MED ORDER — AMLODIPINE BESYLATE 5 MG PO TABS: 5.0000 mg | ORAL_TABLET | Freq: Every day | ORAL | 1 refills | Status: DC

## 2020-12-08 MED ORDER — MONTELUKAST SODIUM 10 MG PO TABS: 10.0000 mg | ORAL_TABLET | Freq: Every day | ORAL | 3 refills | Status: DC

## 2020-12-08 MED ORDER — CETIRIZINE HCL 10 MG PO TABS: 10.0000 mg | ORAL_TABLET | Freq: Every day | ORAL | 11 refills | Status: DC

## 2020-12-08 MED ORDER — DAPAGLIFLOZIN PROPANEDIOL 5 MG PO TABS: 5.0000 mg | ORAL_TABLET | Freq: Every day | ORAL | 1 refills | Status: DC

## 2020-12-08 NOTE — Telephone Encounter (Signed)
Pt called in noting he was asked to call back with Vit D info.  Pt taking Vitamin D3, 2000iu (76mcg) 5 pills per day.

## 2020-12-08 NOTE — Progress Notes (Signed)
This visit occurred during the SARS-CoV-2 public health emergency.  Safety protocols were in place, including screening questions prior to the visit, additional usage of staff PPE, and extensive cleaning of exam room while observing appropriate contact time as indicated for disinfecting solutions.    Adrian [name] , 12-09-1960, 60 y.o., male MRN: [id number] Patient Care Team    Relationship Specialty Notifications Start End  Ma Hillock, DO PCP - General Family Medicine  02/06/18   Tanda Rockers, MD Consulting Physician Pulmonary Disease  02/07/18   Otelia Sergeant, OD Referring Physician   02/07/18   Lavena Bullion, DO Consulting Physician Gastroenterology  01/20/19     Chief Complaint  Patient presents with  . Follow-up  . Diabetes    Pt is not fasting     Subjective: Adrian [name] is a 60 y.o. pt present for cmc follow up Essential hypertension/hyperlipidemia/morbid obesity/CKD3 Pt reports  compliance with losartan 100mg  QD, amlodipine 5 mg and HCTZ 25 mg daily. Patient denies chest pain, shortness of breath, dizziness or lower extremity edema.  Pt is not taking a daily baby ASA. Pt is not prescribed statin 2/2 intolerance x2. Compliance with zeita and tolerating. Labs due next appointment Diet: low Salt Exercise: Routine exercise RF: Hypertension, hyperlipidemia, obesity, diabetes.   Type 2 diabetes mellitus without complication, without long-term current use of insulin ([place]) Patient reports compliance with metformin 1000 mg daily daily.Patient denies dizziness, hyperglycemic or hypoglycemic events. Patient denies numbness, tingling in the extremities or nonhealing wounds of feet. He does have a toenail fungus left great toe.  He states he has cut back on his sugar consumption.  He is prescribed gabapentin 300 mg nightly and reports it is very helpful-mostly for back/leg pain. Diabetes diagnosed 2019 PNA series: Pneumonia series completed Flu shot: Up-to-date  (recommneded yearly) Microalbumin: on ARB Foot exam: Completed 12/08/2020 Eye exam: 04/2020 Dr. Oswaldo Conroy  Asthma: Patient reports his asthma has been rather stable.  Not needing albuterol breakthrough.compliant  with Pulmicort and Singulair nightly. He is now taking a daily antihistamine.    Depression screen [facility] 2/9 07/06/2020 04/07/2020 06/04/2019 09/25/2018 08/19/2018  Decreased Interest 0 0 0 0 0  Down, Depressed, Hopeless 0 0 0 0 0  PHQ - 2 Score 0 0 0 0 0    Allergies  Allergen Reactions  . Atorvastatin     Other reaction(s): myalgias  . Crestor [Rosuvastatin]     Other reaction(s): severe foot pains  . Lisinopril Cough    Other reaction(s): cough  . Statins Other (See Comments)    Causes pain/ foot trouble   Social History   Social History Narrative   Marital status/children/pets: Married.   Education/employment: Associates degree, employed as a Armed forces operational officer:      -Wears a bicycle helmet riding a bike: No     -smoke alarm in the home:Yes     - wears seatbelt: Yes     - Feels safe in their relationships: Yes   Past Medical History:  Diagnosis Date  . Adenomatous colon polyp    FS 02/2004; colo 2005 hyperplastic polyp; 2010 neg - butmucosal prolapse.   . Allergy   . Asthma   . Blood in stool   . Chicken pox   . Colon polyps   . Diabetes mellitus without complication ([place]) 40/9811  . Diverticulosis   . Frequent headaches   . Genital warts   . GERD (gastroesophageal reflux disease)   .  Hemorrhoid   . Hepatic steatosis 04/2019   Elevated liver enzymes-full work-up for infectious disease process with ultrasound normal with the exception of hepatic steatosis  . Hyperlipidemia   . Hypertension   . Hypogonadism in male    labs x2: 11/14/2015 - 216 and 11/17/2015 193; declined med at that time.   . Insomnia   . OSA on CPAP 09/18/2016   HST 09/18/2016; ESS-4; AHI-76; O2 min- 61%  . Pulmonary embolism ([place]) 01/2017   provoked by long care ride, on xarelto,  est Dr. Melvyn Novas  . Thyroid disease    subclinical hypothyroidism per records   Past Surgical History:  Procedure Laterality Date  . ANKLE SURGERY    . COLONOSCOPY  2016   dr Cristina Gong   . ESOPHAGOGASTRODUODENOSCOPY     with dr Cristina Gong    Family History  Problem Relation Age of Onset  . Asthma Mother   . Arthritis Mother   . Allergies Brother   . Alcohol abuse Maternal Grandmother   . Cancer Maternal Grandmother   . Hiatal hernia Maternal Grandfather   . Colon cancer Neg Hx   . Esophageal cancer Neg Hx    Allergies as of 12/08/2020      Reactions   Atorvastatin    Other reaction(s): myalgias   Crestor [rosuvastatin]    Other reaction(s): severe foot pains   Lisinopril Cough   Other reaction(s): cough   Statins Other (See Comments)   Causes pain/ foot trouble      Medication List       Accurate as of Dec 08, 2020 10:31 AM. If you have any questions, ask your nurse or doctor.        amLODipine 5 MG tablet Commonly known as: NORVASC Take 1 tablet (5 mg total) by mouth daily.   budesonide 180 MCG/ACT inhaler Commonly known as: PULMICORT Inhale 2 puffs into the lungs in the morning and at bedtime.   cetirizine 10 MG tablet Commonly known as: ZYRTEC Take 1 tablet (10 mg total) by mouth daily.   cyclobenzaprine 10 MG tablet Commonly known as: FLEXERIL TAKE 1 TABLET BY MOUTH EVERYDAY AT BEDTIME PRN   dapagliflozin propanediol 5 MG Tabs tablet Commonly known as: Farxiga Take 1 tablet (5 mg total) by mouth daily before breakfast.   ezetimibe 10 MG tablet Commonly known as: ZETIA Take 1 tablet (10 mg total) by mouth daily.   fluticasone 50 MCG/ACT nasal spray Commonly known as: FLONASE Place into both nostrils daily.   gabapentin 300 MG capsule Commonly known as: NEURONTIN TAKE 1 CAPSULE BY MOUTH EVERYDAY AT BEDTIME   hydrochlorothiazide 25 MG tablet Commonly known as: HYDRODIURIL Take 1 tablet (25 mg total) by mouth daily.   losartan 100 MG  tablet Commonly known as: COZAAR Take 1 tablet (100 mg total) by mouth daily.   metFORMIN 1000 MG tablet Commonly known as: GLUCOPHAGE Take 1 tablet (1,000 mg total) by mouth 2 (two) times daily with a meal.   montelukast 10 MG tablet Commonly known as: SINGULAIR Take 1 tablet (10 mg total) by mouth at bedtime.   rivaroxaban 10 MG Tabs tablet Commonly known as: Xarelto Take 1 tablet (10 mg total) by mouth daily.   UNABLE TO FIND Med Name: CPAP   Vitamin D (Ergocalciferol) 1.25 MG (50000 UNIT) Caps capsule Commonly known as: DRISDOL Take 1 capsule (50,000 Units total) by mouth every 7 (seven) days.       All past medical history, surgical history, allergies, family history, immunizations  andmedications were updated in the EMR today and reviewed under the history and medication portions of their EMR.     ROS: Negative, with the exception of above mentioned in HPI   Objective:  BP 127/83   Pulse 69   Temp 98.5 F (36.9 C) (Oral)   Ht 6\' 1"  (1.854 m)   Wt (!) 356 lb (161.5 kg)   SpO2 96%   BMI 46.97 kg/m  Body mass index is 46.97 kg/m. Gen: Afebrile. No acute distress. Nontoxic obese pleasant male HENT: AT. [place]. No cough. No hoarseness.  Eyes:Pupils Equal Round Reactive to light, Extraocular movements intact,  Conjunctiva without redness, discharge or icterus. Neck/lymp/endocrine: Supple,no lymphadenopathy, no thyromegaly CV: RRR no murmur, no edema, +2/4 P posterior tibialis pulses Chest: CTAB, no wheeze or crackles Abd: Soft. obese. NTND. BS present. Skin: no rashes, purpura or petechiae. Left lg toe nail fungus present Neuro:  Normal gait. PERLA. EOMi. Alert. Oriented x3 Psych: Normal affect, dress and demeanor. Normal speech. Normal thought content and judgment.  No exam data present No results found. Results for orders placed or performed in visit on 12/08/20 (from the past 24 hour(s))  POCT HgB A1C     Status: Abnormal   Collection Time: 12/08/20 10:07 AM   Result Value Ref Range   Hemoglobin A1C 6.8 (A) 4.0 - 5.6 %   HbA1c POC (<> result, manual entry) 6.8 4.0 - 5.6 %   HbA1c, POC (prediabetic range) 6.8 (A) 5.7 - 6.4 %   HbA1c, POC (controlled diabetic range) 6.8 0.0 - 7.0 %    Assessment/Plan: PRAYAN ULIN is a 60 y.o. male present for OV for  Elevated liver enzymes/Hepatic steatosis -cute hepatitis and autoimmune hepatitis, HIV>negative -Abdominal ultrasound > hepatic steatosis.  -L FTs have been stable we will collect with routine labs yearly> cmp yearly at cpe  Type 2 diabetes mellitus without complication, without long-term current use of insulin (HCC) At goal <7!!! - continue  metformin 1000 milligrams to twice daily dosing.  - continue  farxiga 5 mg qd Continue diet and exercise modifications PNA series: Pneumonia series completed Flu shot: Up-to-date (recommneded yearly) Microalbumin: on ARB Foot exam: Completed 03/2020 Eye exam: 04/2020 Dr. Oswaldo Conroy Toe nail fungus- w/ fatty liver disease/elevated lft> hesitant to prescribed oral terb. Encouraged him to start over the counter topical lamisil spray and solution  daily and advised him he would need to continue for about a year in order to full treat.  A1c: 6.6 >>5.8>>6.3>> 7.2>7.1> 8.9 >7.7> 6.8 today   Mild persistent asthma without complication -stable.  -continue  Pulmicort 1-2 puffs BID.(fornualry changed from Asmanex) Continue Singulair 10 mg daily.  Continue Zyrtec 10 mg nightly  Chronic thoracic back pain: Stable.  Continue  gabapentin nightly  Continue  Flexeril 10 mg nightly as needed.    H/o PE- chronic anticoagulation (lifetime) - continue xarelto - cbc and bmp collected today  Essential hypertension/morbid obesity/HLD< 70 ldl goal/Aortic atherosclerosis/statin intolerant - stable.  - Goal :130/80 with is DM history.  -continue   losartan 100 mg daily. -continue  HCTZ 25 -continue amlodipine 5 mg daily -continue   Zetia 10 mg daily Low sodium  diet Cbc, bmp, ldl collected today- pt aware goal is < 70. He is statin intolerant. Discussed adding repatha or other options if LDL still above goal on zetia alone.  Exercise.      Reviewed expectations re: course of current medical issues.  Discussed self-management of symptoms.  Outlined signs and symptoms  indicating need for more acute intervention.  Patient verbalized understanding and all questions were answered.  Patient received an After-Visit Summary.    Orders Placed This Encounter  Procedures  . Direct LDL  . Vitamin D (25 hydroxy)  . CBC  . Basic Metabolic Panel (BMET)  . POCT HgB A1C   Meds ordered this encounter  Medications  . rivaroxaban (XARELTO) 10 MG TABS tablet    Sig: Take 1 tablet (10 mg total) by mouth daily.    Dispense:  90 tablet    Refill:  1  . montelukast (SINGULAIR) 10 MG tablet    Sig: Take 1 tablet (10 mg total) by mouth at bedtime.    Dispense:  90 tablet    Refill:  3  . metFORMIN (GLUCOPHAGE) 1000 MG tablet    Sig: Take 1 tablet (1,000 mg total) by mouth 2 (two) times daily with a meal.    Dispense:  180 tablet    Refill:  1  . losartan (COZAAR) 100 MG tablet    Sig: Take 1 tablet (100 mg total) by mouth daily.    Dispense:  90 tablet    Refill:  1    Please dc all other losartan scripts and remind pt of changes and only take one of these now. Thank you so much!  . hydrochlorothiazide (HYDRODIURIL) 25 MG tablet    Sig: Take 1 tablet (25 mg total) by mouth daily.    Dispense:  90 tablet    Refill:  1  . gabapentin (NEURONTIN) 300 MG capsule    Sig: TAKE 1 CAPSULE BY MOUTH EVERYDAY AT BEDTIME    Dispense:  90 capsule    Refill:  2  . ezetimibe (ZETIA) 10 MG tablet    Sig: Take 1 tablet (10 mg total) by mouth daily.    Dispense:  90 tablet    Refill:  3  . dapagliflozin propanediol (FARXIGA) 5 MG TABS tablet    Sig: Take 1 tablet (5 mg total) by mouth daily before breakfast.    Dispense:  90 tablet    Refill:  1  .  cyclobenzaprine (FLEXERIL) 10 MG tablet    Sig: TAKE 1 TABLET BY MOUTH EVERYDAY AT BEDTIME PRN    Dispense:  30 tablet    Refill:  5  . cetirizine (ZYRTEC) 10 MG tablet    Sig: Take 1 tablet (10 mg total) by mouth daily.    Dispense:  30 tablet    Refill:  11  . budesonide (PULMICORT) 180 MCG/ACT inhaler    Sig: Inhale 2 puffs into the lungs in the morning and at bedtime.    Dispense:  1 each    Refill:  11  . amLODipine (NORVASC) 5 MG tablet    Sig: Take 1 tablet (5 mg total) by mouth daily.    Dispense:  90 tablet    Refill:  1   Referral Orders  No referral(s) requested today     Note is dictated utilizing voice recognition software. Although note has been proof read prior to signing, occasional typographical errors still can be missed. If any questions arise, please do not hesitate to call for verification.   electronically signed by:  Howard Pouch, DO  [place]

## 2020-12-08 NOTE — Patient Instructions (Signed)
Great to see you today.  I have refilled the medication(s) we provide.   If labs were collected, we will inform you of lab results once received either by echart message or telephone call.   - echart message- for normal results that have been seen by the patient already.   - telephone call: abnormal results or if patient has not viewed results in their echart.  

## 2020-12-09 ENCOUNTER — Telehealth: Payer: Self-pay | Admitting: Family Medicine

## 2020-12-09 MED ORDER — REPATHA 140 MG/ML ~~LOC~~ SOSY: 1.0000 mL | PREFILLED_SYRINGE | SUBCUTANEOUS | 3 refills | Status: DC

## 2020-12-09 NOTE — Telephone Encounter (Signed)
Please inform patient kidney function is normal. Vitamin D levels are normal.  He reported taking high levels of vitamin D at least he thought.  If he is currently taking less than 4000 units a day, then he should continue on his current dose.  If he is taking over 4000 units a day he needs to decrease to 4000 units a day. Blood cell counts and electrolytes are normal   Incidentally, it was also noted he had high calcium.  This is a new finding for him.  If he is taking any calcium supplements he is to stop use of those even in a multivitamin. Increase his water consumption daily is required to help decrease calcium levels - he should consume at least 80-100 ounces daily.   Bad cholesterol/LDL is 141 and his goal is under 70 with hi chronic conditions. He is statin intolerant and already taking zetia daily. Therefore, we would need to add an additional agent called repatha. It is not a statin. It is an injection every 14 days that the patient completes at home.  I have called this in. After reading instructions and watching video on website (info in prescription) he needs additional education, he can make a nurse visit  For assistance.   F/u 8 weeks on new med start and hypercalcemia - FASTING please, labs will be collected.

## 2020-12-09 NOTE — Telephone Encounter (Signed)
Spoke with patient regarding results/recommendations.  

## 2020-12-14 ENCOUNTER — Encounter: Payer: Self-pay | Admitting: Family Medicine

## 2020-12-14 ENCOUNTER — Ambulatory Visit (INDEPENDENT_AMBULATORY_CARE_PROVIDER_SITE_OTHER): Payer: Federal, State, Local not specified - PPO

## 2020-12-14 ENCOUNTER — Telehealth (INDEPENDENT_AMBULATORY_CARE_PROVIDER_SITE_OTHER): Payer: Federal, State, Local not specified - PPO | Admitting: Family Medicine

## 2020-12-14 DIAGNOSIS — Z20822 Contact with and (suspected) exposure to covid-19: Secondary | ICD-10-CM

## 2020-12-14 DIAGNOSIS — R519 Headache, unspecified: Secondary | ICD-10-CM

## 2020-12-14 LAB — POCT INFLUENZA A/B
Influenza A, POC: NEGATIVE
Influenza B, POC: NEGATIVE

## 2020-12-14 NOTE — Progress Notes (Signed)
VIRTUAL VISIT VIA VIDEO  I connected with Adrian [name] on 12/15/20 at  2:45 PM EDT by elemedicine application and verified that I am speaking with the correct person using two identifiers. Location patient: Home Location provider: [facility], Office Persons participating in the virtual visit: Patient, Dr. Raoul Pitch and Darnell Level. Cesar, CMA  I discussed the limitations of evaluation and management by telemedicine and the availability of in person appointments. The patient expressed understanding and agreed to proceed.   SUBJECTIVE Chief Complaint  Patient presents with  . Dizziness    Pt c/o brain fog, dizziness, HA x 4 days; pt had covid exposure at home but had neg covid test x4;     HPI: Adrian [name] is a 60 y.o. male present for exposure to covid 19. His wife, daughter and grandson have been diagnosed with covid over the last week. He has take 4 home test but all have been negative. He has sx that started 4 days ago of "brain fog", dizziness and headache. He denies cough or resp symptoms. He reports he did eat Jiff (that was recalled), but denies GI symptoms. He has also seen a few ticks on him the last few days. He denies rash or fever.  Vaccine x3 ROS: See pertinent positives and negatives per HPI.  Patient Active Problem List   Diagnosis Date Noted  . Allergic rhinitis due to pollen 07/06/2020  . Subclinical hypothyroidism 07/06/2020  . Idiopathic sleep related nonobstructive alveolar hypoventilation 07/06/2020  . Low testosterone 07/06/2020  . Personal history of colonic polyps 07/06/2020  . Personal history of pulmonary embolism 07/06/2020  . Elevated liver enzymes-hepatic steatosis 06/08/2019  . Chronic bilateral thoracic back pain 06/08/2019  . Chronic anticoagulation 01/20/2019  . Aortic atherosclerosis ([place]) 06/12/2018  . Abdominal pain with radiation to back 06/10/2018  . Vitamin D deficiency 02/06/2018  . Mixed hyperlipidemia 02/06/2018  . Mild persistent  asthma without complication 75/17/0017  . Morbid obesity due to excess calories ([place]) 03/14/2017  . Type 2 diabetes mellitus without complication ([place]) 49/44/9675  . Pulmonary hypertension ([place]) 02/02/2017  . Obstructive sleep apnea syndrome 02/01/2017  . Hypertension     Social History   Tobacco Use  . Smoking status: Never Smoker  . Smokeless tobacco: Never Used  Substance Use Topics  . Alcohol use: Yes    Alcohol/week: 3.0 standard drinks    Types: 3 Glasses of wine per week    Current Outpatient Medications:  .  amLODipine (NORVASC) 5 MG tablet, Take 1 tablet (5 mg total) by mouth daily., Disp: 90 tablet, Rfl: 1 .  budesonide (PULMICORT) 180 MCG/ACT inhaler, Inhale 2 puffs into the lungs in the morning and at bedtime., Disp: 1 each, Rfl: 11 .  cetirizine (ZYRTEC) 10 MG tablet, Take 1 tablet (10 mg total) by mouth daily., Disp: 30 tablet, Rfl: 11 .  cyclobenzaprine (FLEXERIL) 10 MG tablet, TAKE 1 TABLET BY MOUTH EVERYDAY AT BEDTIME PRN, Disp: 30 tablet, Rfl: 5 .  dapagliflozin propanediol (FARXIGA) 5 MG TABS tablet, Take 1 tablet (5 mg total) by mouth daily before breakfast., Disp: 90 tablet, Rfl: 1 .  doxycycline (VIBRA-TABS) 100 MG tablet, Take 1 tablet (100 mg total) by mouth 2 (two) times daily., Disp: 20 tablet, Rfl: 0 .  ezetimibe (ZETIA) 10 MG tablet, Take 1 tablet (10 mg total) by mouth daily., Disp: 90 tablet, Rfl: 3 .  fluticasone (FLONASE) 50 MCG/ACT nasal spray, Place into both nostrils daily., Disp: , Rfl:  .  gabapentin (NEURONTIN) 300 MG capsule, TAKE 1 CAPSULE BY MOUTH EVERYDAY AT BEDTIME, Disp: 90 capsule, Rfl: 2 .  hydrochlorothiazide (HYDRODIURIL) 25 MG tablet, Take 1 tablet (25 mg total) by mouth daily., Disp: 90 tablet, Rfl: 1 .  losartan (COZAAR) 100 MG tablet, Take 1 tablet (100 mg total) by mouth daily., Disp: 90 tablet, Rfl: 1 .  metFORMIN (GLUCOPHAGE) 1000 MG tablet, Take 1 tablet (1,000 mg total) by mouth 2 (two) times daily with a meal., Disp: 180 tablet,  Rfl: 1 .  montelukast (SINGULAIR) 10 MG tablet, Take 1 tablet (10 mg total) by mouth at bedtime., Disp: 90 tablet, Rfl: 3 .  rivaroxaban (XARELTO) 10 MG TABS tablet, Take 1 tablet (10 mg total) by mouth daily., Disp: 90 tablet, Rfl: 1 .  UNABLE TO FIND, Med Name: CPAP, Disp: , Rfl:  .  Vitamin D, Ergocalciferol, (DRISDOL) 1.25 MG (50000 UNIT) CAPS capsule, Take 1 capsule (50,000 Units total) by mouth every 7 (seven) days., Disp: 12 capsule, Rfl: 0 .  Evolocumab (REPATHA) 140 MG/ML SOSY, Inject 1 mL into the skin every 14 (fourteen) days. (Patient not taking: Reported on 12/14/2020), Disp: 6 mL, Rfl: 3  Allergies  Allergen Reactions  . Atorvastatin     Other reaction(s): myalgias  . Crestor [Rosuvastatin]     Other reaction(s): severe foot pains  . Lisinopril Cough    Other reaction(s): cough  . Statins Other (See Comments)    Causes pain/ foot trouble    OBJECTIVE: There were no vitals taken for this visit. Gen: No acute distress. Nontoxic in appearance.  HENT: AT. [place].  MMM.  Eyes:Pupils Equal Round Reactive to light, Extraocular movements intact,  Conjunctiva without redness, discharge or icterus. Chest: Cough no present Skin: no rashes, purpura or petechiae.  Neuro: Alert. Oriented x3  Psych: Normal affect and demeanor. Normal speech. Normal thought content and judgment.  ASSESSMENT AND PLAN: Adrian [name] is a 60 y.o. male present for  Close exposure to COVID-19 virus/headache Rest, hydrate.  +/- flonase, mucinex (DM if cough), nettie pot or nasal saline.  paxlovid will be prescribed if COVID-positive> if needed pt will need to be counseled on stopping xarelto during use of paxlovid. ASswell as, monitor for low bp on amlodipine- may need to 1/2 dose while using paxlovid Doxycycline 100 mg twice daily x10 days if COVID-negative, start only if symptoms are worsening or not improving. F/U 2 weeks if not improved.  consider doxy (tick exposure and sinus).  - POCT Influenza A/B;  Future> negative - Novel Coronavirus, NAA ([facility]); Future> pending.    Howard Pouch, DO 12/15/2020   Return in about 2 weeks (around 12/28/2020), or if symptoms worsen or fail to improve.  Orders Placed This Encounter  Procedures  . Novel Coronavirus, NAA ([facility])  . POCT Influenza A/B   Meds ordered this encounter  Medications  . doxycycline (VIBRA-TABS) 100 MG tablet    Sig: Take 1 tablet (100 mg total) by mouth 2 (two) times daily.    Dispense:  20 tablet    Refill:  0   Referral Orders  No referral(s) requested today

## 2020-12-15 ENCOUNTER — Encounter: Payer: Self-pay | Admitting: Family Medicine

## 2020-12-15 LAB — SARS-COV-2, NAA 2 DAY TAT

## 2020-12-15 LAB — NOVEL CORONAVIRUS, NAA: SARS-CoV-2, NAA: NOT DETECTED

## 2020-12-15 MED ORDER — DOXYCYCLINE HYCLATE 100 MG PO TABS: 100.0000 mg | ORAL_TABLET | Freq: Two times a day (BID) | ORAL | 0 refills | Status: DC

## 2020-12-15 NOTE — Patient Instructions (Signed)

## 2020-12-22 NOTE — Telephone Encounter (Signed)
FYI

## 2020-12-23 NOTE — Telephone Encounter (Signed)
Please add to his med list. Thanks

## 2020-12-28 DIAGNOSIS — G4733 Obstructive sleep apnea (adult) (pediatric): Secondary | ICD-10-CM | POA: Diagnosis not present

## 2021-01-02 ENCOUNTER — Telehealth: Payer: Self-pay | Admitting: Family Medicine

## 2021-01-02 NOTE — Telephone Encounter (Signed)
Adrian [name], pt Ph# [phone]  Pt calling about med ordered for him at last appt, Evolocumab (REPATHA) 140 MG/ML SOSY. He said it still hasn't been approved by his insurance per the pharmacy. Pt asking for status update.

## 2021-01-03 NOTE — Telephone Encounter (Signed)
Called pharmacy and spoke with Elmyra Ricks who stated the system had not completed process for send PA request and will fax over request

## 2021-01-04 NOTE — Telephone Encounter (Signed)
Adrian [name] (KeyJeanell Sparrow) Rx #: [id number] Repatha 140MG /ML syringes  Sent to plan 01/03/21

## 2021-01-27 DIAGNOSIS — G4733 Obstructive sleep apnea (adult) (pediatric): Secondary | ICD-10-CM | POA: Diagnosis not present

## 2021-01-30 NOTE — Telephone Encounter (Signed)
Patient following up on request for Repatha.  Evolocumab (REPATHA) 140 MG/ML SOSY [[id number]]    [facility] pharmacy benefits told patient today there must a Peer to Peer call completed.   [employer] [phone]

## 2021-01-30 NOTE — Telephone Encounter (Signed)
PA was denied. Please advise on peer to peer

## 2021-01-31 NOTE — Telephone Encounter (Signed)
Why was the reason provided for the repatha denial and what was the alternatives.  - he is intolerant to all statins- has tried multiple   - he is already taking zetia and not at goal.   Thanks.

## 2021-02-01 NOTE — Telephone Encounter (Signed)
Please see reason for denied PA

## 2021-02-01 NOTE — Telephone Encounter (Signed)
Patient is intolerant of statins, he is allergic ,he has been on multiple statins, including high doses and he had to discontinue. Patient is on Zetia currently and not meeting LDL goals for a diabetic. His ASCVD risk is 23.1%   Please resubmit prior authorization. Thank you

## 2021-02-01 NOTE — Telephone Encounter (Signed)
Spoke with insurance in regards to PA. They had stated that they will need documentation of pt trying statin at high intensity for at least 3 mos inn combination with Zetia (ezetimibe) and the ASCVD 10 year risk score being higher the 7.5%.

## 2021-02-01 NOTE — Telephone Encounter (Signed)
Will call and submit new PA

## 2021-02-02 ENCOUNTER — Ambulatory Visit: Payer: Federal, State, Local not specified - PPO | Admitting: Family Medicine

## 2021-02-06 NOTE — Telephone Encounter (Signed)
PA denied due to documentation not stating that pt has tried 2 trials of statin for at least 2 weeks with or without Zetia. Please see papers scanned into chart.

## 2021-02-08 NOTE — Telephone Encounter (Signed)
Patient states insurance told him they are waiting on a peer to peer call regarding Repatha. He states he did try statins twice in the past, Crestor and atorvastatin, both of which he is allergic two.  Please call [phone] for peer to peer.

## 2021-02-08 NOTE — Telephone Encounter (Signed)
The last I heard they needed documentation of statins, Zetia and his cardiac risk score.  I have provided them with these documentations multiple times.  Please call his insurance and find out what the problem is now.  They should not need a peer peer peer review when he is meeting all criteria. We have never had to do a peer to peer review for any medications, it seems his insurance company is being difficult to work with.  Has been on multiple statins, he is currently on Zetia and has been for a long time.  His cardiac risk score is in prior phone note attached.  Please resubmit a form with everything that they are requiring.

## 2021-02-09 NOTE — Telephone Encounter (Signed)
Pt sched for VV

## 2021-02-09 NOTE — Telephone Encounter (Signed)
Spoke with provider and inform her that PA has been completed 4 times already. Provider agreed to have pt do a VV to update OV notes for insurance purposes.

## 2021-02-15 ENCOUNTER — Telehealth: Payer: Federal, State, Local not specified - PPO | Admitting: Family Medicine

## 2021-02-15 ENCOUNTER — Encounter: Payer: Self-pay | Admitting: Family Medicine

## 2021-02-15 DIAGNOSIS — Z789 Other specified health status: Secondary | ICD-10-CM | POA: Insufficient documentation

## 2021-02-15 DIAGNOSIS — I700 Atherosclerosis of aorta: Secondary | ICD-10-CM

## 2021-02-15 DIAGNOSIS — E1169 Type 2 diabetes mellitus with other specified complication: Secondary | ICD-10-CM

## 2021-02-15 DIAGNOSIS — I10 Essential (primary) hypertension: Secondary | ICD-10-CM

## 2021-02-15 DIAGNOSIS — E785 Hyperlipidemia, unspecified: Secondary | ICD-10-CM

## 2021-02-15 NOTE — Progress Notes (Signed)
VIRTUAL VISIT VIA VIDEO  I connected with Adrian [name] on 02/20/21 at 11:00 AM EDT by elemedicine application and verified that I am speaking with the correct person using two identifiers. Location patient: Home Location provider: [facility], Office Persons participating in the virtual visit: Patient, Dr. Raoul Pitch and Darnell Level. Cesar, CMA  I discussed the limitations of evaluation and management by telemedicine and the availability of in person appointments. The patient expressed understanding and agreed to proceed.   SUBJECTIVE Chief Complaint  Patient presents with   Hyperlipidemia    HPI: Adrian [name] is a 60 y.o. male present to discuss cholesterol management.  -He has laboratory results less than 90 days, that show a LDL-C greater than 70, with Zetia 10 mg daily use.  - He is intolerant to statins and had taking both Crestor and Lipitor.  - He has a history of atherosclerotic cardiovascular disease, hypertension, diabetes and hyperlipidemia. -Patient will be reassessed for response to medication and LDL-see reduction, as well as adherence to the prescribed lipid-lowering regimen after 3 months. -Patient has been assessed for high risk atherosclerotic cardiovascular disease by ASCVD assessment> 23.1% risk of heart attack or stroke in the next 10 years. -Patient has a documented history of ASCVD cardiovascular risk events with findings from CT consistent with aortic atherosclerosis. -Patient had been on both Crestor and Lipitor, at different times greater 4 weeks with persistent muscle symptoms, pain and cramping.  Therefore patient has a contraindication to statin use.  ROS: See pertinent positives and negatives per HPI.  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) 03/14/2017   Hyperlipidemia associated with type 2 diabetes mellitus ([place]) 02/02/2017   Pulmonary hypertension ([place]) 02/02/2017   Obstructive sleep apnea syndrome 02/01/2017   Hypertension     Social History   Tobacco Use   Smoking status: Never   Smokeless tobacco: Never  Substance Use Topics   Alcohol use: Yes    Alcohol/week: 3.0 standard drinks    Types: 3 Glasses of wine per week    Current Outpatient Medications:    amLODipine (NORVASC) 5 MG tablet, Take 1 tablet (5 mg total) by mouth daily., Disp: 90 tablet, Rfl: 1   budesonide (PULMICORT) 180 MCG/ACT inhaler, Inhale 2 puffs into the lungs in the morning and at bedtime., Disp: 1 each, Rfl: 11   cetirizine (ZYRTEC) 10 MG tablet, Take 1 tablet (10 mg total) by mouth daily., Disp: 30 tablet, Rfl: 11   cyclobenzaprine (FLEXERIL) 10 MG tablet, TAKE 1 TABLET BY MOUTH EVERYDAY AT BEDTIME PRN, Disp: 30 tablet, Rfl: 5   dapagliflozin propanediol (FARXIGA) 5 MG TABS tablet, Take 1 tablet (5 mg total) by mouth daily before breakfast., Disp: 90 tablet, Rfl: 1   ezetimibe (ZETIA) 10 MG tablet, Take 1 tablet (10 mg total) by mouth daily., Disp:  90 tablet, Rfl: 3   fluticasone (FLONASE) 50 MCG/ACT nasal spray, Place into both nostrils daily., Disp: , Rfl:    gabapentin (NEURONTIN) 300 MG capsule, TAKE 1 CAPSULE BY MOUTH EVERYDAY AT BEDTIME, Disp: 90 capsule, Rfl: 2   hydrochlorothiazide (HYDRODIURIL) 25 MG  tablet, Take 1 tablet (25 mg total) by mouth daily., Disp: 90 tablet, Rfl: 1   losartan (COZAAR) 100 MG tablet, Take 1 tablet (100 mg total) by mouth daily., Disp: 90 tablet, Rfl: 1   metFORMIN (GLUCOPHAGE) 1000 MG tablet, Take 1 tablet (1,000 mg total) by mouth 2 (two) times daily with a meal., Disp: 180 tablet, Rfl: 1   montelukast (SINGULAIR) 10 MG tablet, Take 1 tablet (10 mg total) by mouth at bedtime., Disp: 90 tablet, Rfl: 3   rivaroxaban (XARELTO) 10 MG TABS tablet, Take 1 tablet (10 mg total) by mouth daily., Disp: 90 tablet, Rfl: 1   UNABLE TO FIND, Med Name: CPAP, Disp: , Rfl:    Evolocumab (REPATHA) 140 MG/ML SOSY, Inject 1 mL into the skin every 14 (fourteen) days., Disp: 6 mL, Rfl: 3  Allergies  Allergen Reactions   Atorvastatin     Other reaction(s): myalgias   Crestor [Rosuvastatin]     Other reaction(s): severe foot pains   Lisinopril Cough    Other reaction(s): cough   Statins Other (See Comments)    Causes pain/ foot trouble    OBJECTIVE: There were no vitals taken for this visit. Gen: No acute distress. Nontoxic in appearance. Pleasant obese male.  HENT: AT. [place].  MMM.  Eyes:Pupils Equal Round Reactive to light, Extraocular movements intact,  Conjunctiva without redness, discharge or icterus. Chest: Cough not present Skin: no rashes, purpura or petechiae.  Neuro: Alert. Oriented x3  Psych: Normal affect and demeanor. Normal speech. Normal thought content and judgment.  ASSESSMENT AND PLAN: Adrian [name] is a 60 y.o. male present for  Aortic atherosclerosis (HCC)/Hyperlipidemia LDL goal <70/Hyperlipidemia associated with type 2 diabetes mellitus (HCC)/Statin intolerance -He has laboratory results less than 90 days, that show a LDL-C greater than 70, with Zetia 10 mg daily use.  - He is intolerant to statins and had taking both Crestor and Lipitor.  - He has a history of atherosclerotic cardiovascular disease, hypertension, diabetes and hyperlipidemia. -Patient  will be reassessed for response to medication and LDL-see reduction, as well as adherence to the prescribed lipid-lowering regimen after 3 months. -Patient has been assessed for high risk atherosclerotic cardiovascular disease by ASCVD assessment> 23.1% risk of heart attack or stroke in the next 10 years. -Patient has a documented history of ASCVD cardiovascular risk events with findings from CT consistent with aortic atherosclerosis. -Patient had been on both Crestor and Lipitor, at different times greater 4 weeks with persistent muscle symptoms, pain and cramping.  Therefore patient has a contraindication to statin use.      Howard Pouch, DO 02/20/2021   Return in about 3 months (around 05/18/2021) for After starting repatha.  Orders Placed This Encounter  Procedures   Lipid panel   Meds ordered this encounter  Medications   Evolocumab (REPATHA) 140 MG/ML SOSY    Sig: Inject 1 mL into the skin every 14 (fourteen) days.    Dispense:  6 mL    Refill:  3   Referral Orders  No referral(s) requested today

## 2021-02-17 ENCOUNTER — Other Ambulatory Visit: Payer: Self-pay

## 2021-02-17 ENCOUNTER — Ambulatory Visit (INDEPENDENT_AMBULATORY_CARE_PROVIDER_SITE_OTHER): Payer: Federal, State, Local not specified - PPO

## 2021-02-17 DIAGNOSIS — I700 Atherosclerosis of aorta: Secondary | ICD-10-CM | POA: Diagnosis not present

## 2021-02-17 LAB — LIPID PANEL
Cholesterol: 194 mg/dL (ref 0–200)
HDL: 32.6 mg/dL — ABNORMAL LOW (ref >39.00)
NonHDL: 161.4
Total CHOL/HDL Ratio: 6
Triglycerides: 235 mg/dL — ABNORMAL HIGH (ref 0.0–149.0)
VLDL: 47 mg/dL — ABNORMAL HIGH (ref 0.0–40.0)

## 2021-02-17 LAB — LDL CHOLESTEROL, DIRECT: Direct LDL: 133 mg/dL

## 2021-02-20 ENCOUNTER — Encounter: Payer: Self-pay | Admitting: Family Medicine

## 2021-02-20 DIAGNOSIS — G4733 Obstructive sleep apnea (adult) (pediatric): Secondary | ICD-10-CM | POA: Diagnosis not present

## 2021-02-20 MED ORDER — REPATHA 140 MG/ML ~~LOC~~ SOSY: 1.0000 mL | PREFILLED_SYRINGE | SUBCUTANEOUS | 3 refills | Status: DC

## 2021-02-21 ENCOUNTER — Telehealth: Payer: Self-pay

## 2021-02-21 NOTE — Telephone Encounter (Signed)
PA started on cover my meds  Adrian [name] ([name]) Rx #: [id number] Repatha '140MG'$ /ML syringes

## 2021-02-23 NOTE — Telephone Encounter (Signed)
Faxed received requesting OV and labs. Both printed and faxing back to insurance now

## 2021-02-27 DIAGNOSIS — G4733 Obstructive sleep apnea (adult) (pediatric): Secondary | ICD-10-CM | POA: Diagnosis not present

## 2021-02-27 NOTE — Telephone Encounter (Signed)
Pt aware PA is approved

## 2021-02-27 NOTE — Telephone Encounter (Signed)
PA approved.  LVM for pt to CB regarding medication.

## 2021-03-02 ENCOUNTER — Ambulatory Visit: Payer: Federal, State, Local not specified - PPO | Admitting: Family Medicine

## 2021-03-23 ENCOUNTER — Ambulatory Visit: Payer: Federal, State, Local not specified - PPO | Admitting: Family Medicine

## 2021-03-30 DIAGNOSIS — G4733 Obstructive sleep apnea (adult) (pediatric): Secondary | ICD-10-CM | POA: Diagnosis not present

## 2021-04-12 DIAGNOSIS — I2721 Secondary pulmonary arterial hypertension: Secondary | ICD-10-CM | POA: Diagnosis not present

## 2021-04-12 DIAGNOSIS — G4733 Obstructive sleep apnea (adult) (pediatric): Secondary | ICD-10-CM | POA: Diagnosis not present

## 2021-04-12 DIAGNOSIS — G4734 Idiopathic sleep related nonobstructive alveolar hypoventilation: Secondary | ICD-10-CM | POA: Diagnosis not present

## 2021-04-14 ENCOUNTER — Telehealth: Payer: Self-pay

## 2021-04-14 NOTE — Telephone Encounter (Signed)
NOTES ON [place] [phone], SENT REFERRAL TO SCHEDULING

## 2021-04-15 ENCOUNTER — Other Ambulatory Visit: Payer: Self-pay | Admitting: Family Medicine

## 2021-04-15 DIAGNOSIS — I10 Essential (primary) hypertension: Secondary | ICD-10-CM

## 2021-04-15 DIAGNOSIS — E119 Type 2 diabetes mellitus without complications: Secondary | ICD-10-CM

## 2021-04-29 DIAGNOSIS — G4733 Obstructive sleep apnea (adult) (pediatric): Secondary | ICD-10-CM | POA: Diagnosis not present

## 2021-05-05 ENCOUNTER — Telehealth: Payer: Self-pay

## 2021-05-05 NOTE — Telephone Encounter (Signed)
NOTES SCANNED TO REFERRAL 

## 2021-05-17 ENCOUNTER — Other Ambulatory Visit: Payer: Self-pay

## 2021-05-17 ENCOUNTER — Ambulatory Visit: Payer: Federal, State, Local not specified - PPO | Admitting: Family Medicine

## 2021-05-17 ENCOUNTER — Encounter: Payer: Self-pay | Admitting: Family Medicine

## 2021-05-17 VITALS — BP 123/82 | HR 68 | Temp 98.4°F | Ht 72.0 in | Wt 357.0 lb

## 2021-05-17 DIAGNOSIS — E785 Hyperlipidemia, unspecified: Secondary | ICD-10-CM | POA: Diagnosis not present

## 2021-05-17 DIAGNOSIS — I700 Atherosclerosis of aorta: Secondary | ICD-10-CM

## 2021-05-17 DIAGNOSIS — R748 Abnormal levels of other serum enzymes: Secondary | ICD-10-CM | POA: Diagnosis not present

## 2021-05-17 HISTORY — DX: Hypercalcemia: E83.52

## 2021-05-17 LAB — COMPREHENSIVE METABOLIC PANEL
ALT: 44 U/L (ref 0–53)
AST: 33 U/L (ref 0–37)
Albumin: 4.2 g/dL (ref 3.5–5.2)
Alkaline Phosphatase: 66 U/L (ref 39–117)
BUN: 16 mg/dL (ref 6–23)
CO2: 27 mEq/L (ref 19–32)
Calcium: 10.1 mg/dL (ref 8.4–10.5)
Chloride: 103 mEq/L (ref 96–112)
Creatinine, Ser: 0.94 mg/dL (ref 0.40–1.50)
GFR: 88.66 mL/min (ref >60.00)
Glucose, Bld: 161 mg/dL — ABNORMAL HIGH (ref 70–99)
Potassium: 4.1 mEq/L (ref 3.5–5.1)
Sodium: 137 mEq/L (ref 135–145)
Total Bilirubin: 0.4 mg/dL (ref 0.2–1.2)
Total Protein: 7.2 g/dL (ref 6.0–8.3)

## 2021-05-17 LAB — LIPID PANEL
Cholesterol: 148 mg/dL (ref 0–200)
HDL: 37.5 mg/dL — ABNORMAL LOW (ref >39.00)
LDL Cholesterol: 81 mg/dL (ref 0–99)
NonHDL: 110.9
Total CHOL/HDL Ratio: 4
Triglycerides: 149 mg/dL (ref 0.0–149.0)
VLDL: 29.8 mg/dL (ref 0.0–40.0)

## 2021-05-17 NOTE — Patient Instructions (Addendum)
Great to see you today.  I have refilled the medication(s) we provide.   If labs were collected, we will inform you of lab results once received either by echart message or telephone call.   - echart message- for normal results that have been seen by the patient already.   - telephone call: abnormal results or if patient has not viewed results in their echart.  High Cholesterol High cholesterol is a condition in which the blood has high levels of a white, waxy substance similar to fat (cholesterol). The liver makes all the cholesterol that the body needs. The human body needs small amounts of cholesterol to help build cells. A person gets extra or excess cholesterol from the food that he or she eats. The blood carries cholesterol from the liver to the rest of the body. If you have high cholesterol, deposits (plaques) may build up on the walls of your arteries. Arteries are the blood vessels that carry blood away from your heart. These plaques make the arteries narrow and stiff. Cholesterol plaques increase your risk for heart attack and stroke. Work with your health care provider to keep your cholesterol levels in a healthy range. What increases the risk? The following factors may make you more likely to develop this condition: Eating foods that are high in animal fat (saturated fat) or cholesterol. Being overweight. Not getting enough exercise. A family history of high cholesterol (familial hypercholesterolemia). Use of tobacco products. Having diabetes. What are the signs or symptoms? In most cases, high cholesterol does not usually cause any symptoms. In severe cases, very high cholesterol levels can cause: Fatty bumps under the skin (xanthomas). A white or gray ring around the black center (pupil) of the eye. How is this diagnosed? This condition may be diagnosed based on the results of a blood test. If you are older than 59 years of age, your health care provider may check your  cholesterol levels every 4-6 years. You may be checked more often if you have high cholesterol or other risk factors for heart disease. The blood test for cholesterol measures: "Bad" cholesterol, or LDL cholesterol. This is the main type of cholesterol that causes heart disease. The desired level is less than 100 mg/dL (2.59 mmol/L). "Good" cholesterol, or HDL cholesterol. HDL helps protect against heart disease by cleaning the arteries and carrying the LDL to the liver for processing. The desired level for HDL is 60 mg/dL (1.55 mmol/L) or higher. Triglycerides. These are fats that your body can store or burn for energy. The desired level is less than 150 mg/dL (1.69 mmol/L). Total cholesterol. This measures the total amount of cholesterol in your blood and includes LDL, HDL, and triglycerides. The desired level is less than 200 mg/dL (5.17 mmol/L). How is this treated? Treatment for high cholesterol starts with lifestyle changes, such as diet and exercise. Diet changes. You may be asked to eat foods that have more fiber and less saturated fats or added sugar. Lifestyle changes. These may include regular exercise, maintaining a healthy weight, and quitting use of tobacco products. Medicines. These are given when diet and lifestyle changes have not worked. You may be prescribed a statin medicine to help lower your cholesterol levels. Follow these instructions at home: Eating and drinking  Eat a healthy, balanced diet. This diet includes: Daily servings of a variety of fresh, frozen, or canned fruits and vegetables. Daily servings of whole grain foods that are rich in fiber. Foods that are low in saturated fats and  trans fats. These include poultry and fish without skin, lean cuts of meat, and low-fat dairy products. A variety of fish, especially oily fish that contain omega-3 fatty acids. Aim to eat fish at least 2 times a week. Avoid foods and drinks that have added sugar. Use healthy cooking  methods, such as roasting, grilling, broiling, baking, poaching, steaming, and stir-frying. Do not fry your food except for stir-frying. If you drink alcohol: Limit how much you have to: 0-1 drink a day for women who are not pregnant. 0-2 drinks a day for men. Know how much alcohol is in a drink. In the [place] one drink equals one 12 oz bottle of beer (355 mL), one 5 oz glass of wine (148 mL), or one 1 oz glass of hard liquor (44 mL). Lifestyle  Get regular exercise. Aim to exercise for a total of 150 minutes a week. Increase your activity level by doing activities such as gardening, walking, and taking the stairs. Do not use any products that contain nicotine or tobacco. These products include cigarettes, chewing tobacco, and vaping devices, such as e-cigarettes. If you need help quitting, ask your health care provider. General instructions Take over-the-counter and prescription medicines only as told by your health care provider. Keep all follow-up visits. This is important. Where to find more information American Heart Association: www.heart.org National Heart, Lung, and Blood Institute: https://wilson-eaton.com/ Contact a health care provider if: You have trouble achieving or maintaining a healthy diet or weight. You are starting an exercise program. You are unable to stop smoking. Get help right away if: You have chest pain. You have trouble breathing. You have discomfort or pain in your jaw, neck, back, shoulder, or arm. You have any symptoms of a stroke. "BE FAST" is an easy way to remember the main warning signs of a stroke: B - Balance. Signs are dizziness, sudden trouble walking, or loss of balance. E - Eyes. Signs are trouble seeing or a sudden change in vision. F - Face. Signs are sudden weakness or numbness of the face, or the face or eyelid drooping on one side. A - Arms. Signs are weakness or numbness in an arm. This happens suddenly and usually on one side of the body. S -  Speech. Signs are sudden trouble speaking, slurred speech, or trouble understanding what people say. T - Time. Time to call emergency services. Write down what time symptoms started. You have other signs of a stroke, such as: A sudden, severe headache with no known cause. Nausea or vomiting. Seizure. These symptoms may represent a serious problem that is an emergency. Do not wait to see if the symptoms will go away. Get medical help right away. Call your local emergency services (911 in the [place] Do not drive yourself to the hospital. Summary Cholesterol plaques increase your risk for heart attack and stroke. Work with your health care provider to keep your cholesterol levels in a healthy range. Eat a healthy, balanced diet, get regular exercise, and maintain a healthy weight. Do not use any products that contain nicotine or tobacco. These products include cigarettes, chewing tobacco, and vaping devices, such as e-cigarettes. Get help right away if you have any symptoms of a stroke. This information is not intended to replace advice given to you by your health care provider. Make sure you discuss any questions you have with your health care provider. Document Revised: 09/22/2020 Document Reviewed: 09/12/2020 Elsevier Patient Education  2022 [employer].

## 2021-05-17 NOTE — Progress Notes (Signed)
Patient Care Team    Relationship Specialty Notifications Start End  Ma Hillock, DO PCP - General Family Medicine  02/06/18   Tanda Rockers, MD Consulting Physician Pulmonary Disease  02/07/18   Otelia Sergeant, OD Referring Physician   02/07/18   Lavena Bullion, DO Consulting Physician Gastroenterology  01/20/19      SUBJECTIVE Chief Complaint  Patient presents with   Hyperlipidemia    F/u; pt is fasting    HPI: HARSHAAN WHANG is a 60 y.o. male present to discuss cholesterol management.  He is tolerating repatha injections q 14 days. He is fasting today and excited to see how it is working for him.  Prior note:   LDL-C greater than 70, with Zetia 10 mg daily use.  - He is intolerant to statins and had taking both Crestor and Lipitor.  - He has a history of atherosclerotic cardiovascular disease, hypertension, diabetes and hyperlipidemia. -Patient will be reassessed for response to medication and LDL-see reduction, as well as adherence to the prescribed lipid-lowering regimen after 3 months. -Patient has been assessed for high risk atherosclerotic cardiovascular disease by ASCVD assessment> 23.1% risk of heart attack or stroke in the next 10 years. -Patient has a documented history of ASCVD cardiovascular risk events with findings from CT consistent with aortic atherosclerosis. -Patient had been on both Crestor and Lipitor, at different times greater 4 weeks with persistent muscle symptoms, pain and cramping.  Therefore patient has a contraindication to statin use.  ROS: See pertinent positives and negatives per HPI.  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/06/2020   Low testosterone 07/06/2020   Personal history of colonic polyps 07/06/2020    Personal history of pulmonary embolism 07/06/2020   Elevated liver enzymes-hepatic steatosis 06/08/2019   Chronic bilateral thoracic back pain 06/08/2019   Chronic anticoagulation 01/20/2019   Aortic atherosclerosis ([place]) 06/12/2018   Abdominal pain with radiation to back 06/10/2018   Vitamin D deficiency 02/06/2018   Hyperlipidemia LDL goal <70 02/06/2018   Mild persistent asthma without complication 75/44/9201   Morbid obesity due to excess calories ([place]) 03/14/2017   Hyperlipidemia associated with type 2 diabetes mellitus ([place]) 02/02/2017   Pulmonary hypertension ([place]) 02/02/2017   Obstructive sleep apnea syndrome 02/01/2017   Hypertension     Social History   Tobacco Use   Smoking status: Never   Smokeless tobacco: Never  Substance Use Topics   Alcohol use: Yes    Alcohol/week: 3.0 standard drinks    Types: 3 Glasses of wine per week    Current Outpatient Medications:    amLODipine (NORVASC) 5 MG tablet, Take 1 tablet (5 mg total) by mouth daily., Disp: 90 tablet, Rfl: 1   budesonide (PULMICORT) 180 MCG/ACT inhaler, Inhale 2 puffs into the lungs in the morning and at bedtime., Disp: 1 each, Rfl: 11   cetirizine (ZYRTEC) 10 MG tablet, Take 1 tablet (10 mg total) by mouth daily., Disp: 30 tablet, Rfl: 11   cyclobenzaprine (FLEXERIL) 10 MG tablet, TAKE 1 TABLET BY MOUTH EVERYDAY AT BEDTIME PRN, Disp: 30 tablet, Rfl: 5   dapagliflozin propanediol (FARXIGA) 5 MG TABS tablet, Take 1 tablet (5 mg total) by mouth daily before breakfast., Disp: 90 tablet, Rfl: 1   Evolocumab (REPATHA) 140 MG/ML SOSY, Inject 1 mL into  the skin every 14 (fourteen) days., Disp: 6 mL, Rfl: 3   ezetimibe (ZETIA) 10 MG tablet, Take 1 tablet (10 mg total) by mouth daily., Disp: 90 tablet, Rfl: 3   fluticasone (FLONASE) 50 MCG/ACT nasal spray, Place into both nostrils daily., Disp: , Rfl:    gabapentin (NEURONTIN) 300 MG capsule, TAKE 1 CAPSULE BY MOUTH EVERYDAY AT BEDTIME, Disp: 90 capsule, Rfl: 2    hydrochlorothiazide (HYDRODIURIL) 25 MG tablet, Take 1 tablet (25 mg total) by mouth daily., Disp: 90 tablet, Rfl: 1   losartan (COZAAR) 100 MG tablet, Take 1 tablet (100 mg total) by mouth daily., Disp: 90 tablet, Rfl: 1   metFORMIN (GLUCOPHAGE) 1000 MG tablet, Take 1 tablet (1,000 mg total) by mouth 2 (two) times daily with a meal., Disp: 180 tablet, Rfl: 1   montelukast (SINGULAIR) 10 MG tablet, Take 1 tablet (10 mg total) by mouth at bedtime., Disp: 90 tablet, Rfl: 3   rivaroxaban (XARELTO) 10 MG TABS tablet, Take 1 tablet (10 mg total) by mouth daily., Disp: 90 tablet, Rfl: 1   UNABLE TO FIND, Med Name: CPAP, Disp: , Rfl:   Allergies  Allergen Reactions   Atorvastatin     Other reaction(s): myalgias   Crestor [Rosuvastatin]     Other reaction(s): severe foot pains   Lisinopril Cough    Other reaction(s): cough   Statins Other (See Comments)    Causes pain/ foot trouble    OBJECTIVE: BP 123/82   Pulse 68   Temp 98.4 F (36.9 C) (Oral)   Ht 6' (1.829 m)   Wt (!) 357 lb (161.9 kg)   SpO2 94%   BMI 48.42 kg/m  Gen: Afebrile. No acute distress.  HENT: AT. [place].  Eyes:Pupils Equal Round Reactive to light, Extraocular movements intact,  Conjunctiva without redness, discharge or icterus. Neck/lymp/endocrine: Supple,no lymphadenopathy CV: RRR no murmur, no edema Chest: CTAB, no wheeze or crackles Skin: no rashes, purpura or petechiae.  Neuro:  Normal gait. PERLA. EOMi. Alert. Oriented x3 Psych: Normal affect, dress and demeanor. Normal speech. Normal thought content and judgment.  ASSESSMENT AND PLAN: BURNETTE SAUTTER is a 60 y.o. male present for  Aortic atherosclerosis (HCC)/Hyperlipidemia LDL goal <70/Hyperlipidemia associated with type 2 diabetes mellitus (HCC)/Statin intolerance Tolerating repatha injections q 14 days. Continue Continue zetia.  Fasting today.  Lipid and cmp collected.   Hypercalcemia:  He has cut back on high calcium foods and supplement.  Ca 11.1 in  May, was an isolated incident of increase for him.  Cmp today. If still elevated would consider pth and dexa.     Howard Pouch, DO 05/17/2021   Return for has appt scheduled. .  Orders Placed This Encounter  Procedures   Lipid panel   Comprehensive metabolic panel    No orders of the defined types were placed in this encounter.  Referral Orders  No referral(s) requested today

## 2021-05-19 ENCOUNTER — Other Ambulatory Visit: Payer: Self-pay

## 2021-05-19 ENCOUNTER — Encounter (HOSPITAL_BASED_OUTPATIENT_CLINIC_OR_DEPARTMENT_OTHER): Payer: Self-pay | Admitting: Cardiovascular Disease

## 2021-05-19 ENCOUNTER — Ambulatory Visit (HOSPITAL_BASED_OUTPATIENT_CLINIC_OR_DEPARTMENT_OTHER): Payer: Federal, State, Local not specified - PPO | Admitting: Cardiovascular Disease

## 2021-05-19 DIAGNOSIS — G4733 Obstructive sleep apnea (adult) (pediatric): Secondary | ICD-10-CM | POA: Diagnosis not present

## 2021-05-19 DIAGNOSIS — E785 Hyperlipidemia, unspecified: Secondary | ICD-10-CM

## 2021-05-19 DIAGNOSIS — I272 Pulmonary hypertension, unspecified: Secondary | ICD-10-CM | POA: Diagnosis not present

## 2021-05-19 DIAGNOSIS — I10 Essential (primary) hypertension: Secondary | ICD-10-CM | POA: Diagnosis not present

## 2021-05-19 DIAGNOSIS — E1169 Type 2 diabetes mellitus with other specified complication: Secondary | ICD-10-CM

## 2021-05-19 DIAGNOSIS — R0789 Other chest pain: Secondary | ICD-10-CM | POA: Insufficient documentation

## 2021-05-19 DIAGNOSIS — Z86711 Personal history of pulmonary embolism: Secondary | ICD-10-CM

## 2021-05-19 HISTORY — DX: Other chest pain: R07.89

## 2021-05-19 MED ORDER — METOPROLOL TARTRATE 50 MG PO TABS: ORAL_TABLET | ORAL | 0 refills | Status: DC

## 2021-05-19 NOTE — Assessment & Plan Note (Signed)
His blood pressure is elevated in the office today but he thinks it has been generally well have been controlled at home.  He is going to get a new machine and start checking his blood pressures regularly.  For now, continue amlodipine, hydrochlorothiazide, and losartan.

## 2021-05-19 NOTE — Assessment & Plan Note (Signed)
Adrian [name] does get substernal chest pain.  It does occur with exertion, but is usually followed by belching and relief of his symptoms.  He thinks it is because of his abdominal girth.  We will get a coronary CTA which will allow [place] to look at his coronaries and also assess for any underlying pulmonary pathology that might be contributing to elevated pulmonary pressures.

## 2021-05-19 NOTE — Assessment & Plan Note (Signed)
He had a single pulmonary embolism in the setting of a car ride from [place] in which she did not stop to take any rest.  It would seem that this to be considered a provoked PE.  He is very conscientious about getting up and walking.  Recommended that he talk with his PCP and pulmonologist about whether he needs to be on lifelong anticoagulation.  Typically stop anticoagulation after 3 to 6 months of therapy.  He does not recall if he had any other hypercoagulable work-up that would make him more prone to recurrent pulmonary embolism.

## 2021-05-19 NOTE — Progress Notes (Signed)
Cardiology Office Note:    Date:  05/19/2021   ID:  Adrian [name], DOB 1961/03/02, MRN [id number]  PCP:  Ma Hillock, DO   [place] Providers Cardiologist:  None     Referring MD: Carlos Levering, PA-C   No chief complaint on file.   History of Present Illness:    Adrian [name] is a 60 y.o. male with a hx of hyperlipidemia, hypertension, diabetes mellitus, GERD, asthma, OSA on CPAP, pulmonary embolism (2018), hepatic steatosis, and hypogonadism, here for the evaluation of high pulmonary arterial pressure. He had an echo in 07/2017 with LVEF 55-60%. Right ventricular function was normal, and PASP could not be calculated because there was no tricuspid regurgitation. Prior to that he had an echo 01/2017 with PASP 61 mmHg. He has a history of statin intolerance but does well with Repatha.   Today, he is feeling fine overall. He states his breathing is okay, but he endorses shortness of breath with minimal exertion for the past 20 years. Going for a small walk will cause him to be winded. Additionally he suffers from constant chest pain and pressure. While walking, his abdomen fully expands, and he needs to belch to relieve this pressure. His oxygen saturation used to be baseline 93-94%, but is now 97% with his medication changes. Now he sleeps with 2 L oxygen and his CPAP. Prior to using a CPAP, he would often develop 10-15 min muscle cramps in his legs. He does have bilateral pain and coldness in his feet that he attributes to arthritis. Since his previous pulmonary embolism in 2018, he is careful to make himself stand up and move every 1.5 hours. At home he does not monitor his blood pressure, but notes it was 132/83 a few days ago at his PCP's office. For exercise, over the summer he regularly treads water in the pool for an hour. Typically he is always active with something, including grocery shopping, cooking, and yard work. He has never been a smoker. He denies any palpitations. No  lightheadedness, headaches, syncope, orthopnea, or PND.   Past Medical History:  Diagnosis Date   Adenomatous colon polyp    FS 02/2004; colo 2005 hyperplastic polyp; 2010 neg - butmucosal prolapse.    Allergy    Asthma    Atypical chest pain 05/19/2021   Blood in stool    Chicken pox    Colon polyps    Diabetes mellitus without complication ([place]) 38/9373   Diverticulosis    Frequent headaches    Genital warts    GERD (gastroesophageal reflux disease)    Hemorrhoid    Hepatic steatosis 04/2019   Elevated liver enzymes-full work-up for infectious disease process with ultrasound normal with the exception of hepatic steatosis   Hyperlipidemia    Hypertension    Hypogonadism in male    labs x2: 11/14/2015 - 216 and 11/17/2015 193; declined med at that time.    Insomnia    OSA on CPAP 09/18/2016   HST 09/18/2016; ESS-4; AHI-76; O2 min- 61%   Pulmonary embolism ([place]) 01/2017   provoked by long care ride, on xarelto, est Dr. Melvyn Novas   Thyroid disease    subclinical hypothyroidism per records    Past Surgical History:  Procedure Laterality Date   ANKLE SURGERY     COLONOSCOPY  2016   dr Cristina Gong    ESOPHAGOGASTRODUODENOSCOPY     with dr Cristina Gong     Current Medications: Current Meds  Medication Sig   amLODipine ([place])  5 MG tablet Take 1 tablet (5 mg total) by mouth daily.   budesonide (PULMICORT) 180 MCG/ACT inhaler Inhale 2 puffs into the lungs in the morning and at bedtime.   cetirizine (ZYRTEC) 10 MG tablet Take 1 tablet (10 mg total) by mouth daily.   cyclobenzaprine (FLEXERIL) 10 MG tablet TAKE 1 TABLET BY MOUTH EVERYDAY AT BEDTIME PRN   dapagliflozin propanediol (FARXIGA) 5 MG TABS tablet Take 1 tablet (5 mg total) by mouth daily before breakfast.   Evolocumab (REPATHA) 140 MG/ML SOSY Inject 1 mL into the skin every 14 (fourteen) days.   ezetimibe (ZETIA) 10 MG tablet Take 1 tablet (10 mg total) by mouth daily.   fluticasone (FLONASE) 50 MCG/ACT nasal spray Place into  both nostrils daily.   gabapentin (NEURONTIN) 300 MG capsule TAKE 1 CAPSULE BY MOUTH EVERYDAY AT BEDTIME   hydrochlorothiazide (HYDRODIURIL) 25 MG tablet Take 1 tablet (25 mg total) by mouth daily.   losartan (COZAAR) 100 MG tablet Take 1 tablet (100 mg total) by mouth daily.   metFORMIN (GLUCOPHAGE) 1000 MG tablet Take 1 tablet (1,000 mg total) by mouth 2 (two) times daily with a meal.   metoprolol tartrate (LOPRESSOR) 50 MG tablet TAKE 1 TABLET 2 HOURS PRIOR TO CT   montelukast (SINGULAIR) 10 MG tablet Take 1 tablet (10 mg total) by mouth at bedtime.   rivaroxaban (XARELTO) 10 MG TABS tablet Take 1 tablet (10 mg total) by mouth daily.   UNABLE TO FIND Med Name: CPAP     Allergies:   Atorvastatin, Crestor [rosuvastatin], Lisinopril, and Statins   Social History   Socioeconomic History   Marital status: Married    Spouse name: Not on file   Number of children: 2   Years of education: Not on file   Highest education level: Not on file  Occupational History   Not on file  Tobacco Use   Smoking status: Never   Smokeless tobacco: Never  Vaping Use   Vaping Use: Never used  Substance and Sexual Activity   Alcohol use: Yes    Alcohol/week: 3.0 standard drinks    Types: 3 Glasses of wine per week   Drug use: No   Sexual activity: Yes    Partners: Female  Other Topics Concern   Not on file  Social History Narrative   Marital status/children/pets: Married.   Education/employment: Associates degree, employed as a Armed forces operational officer:      -Wears a bicycle helmet riding a bike: No     -smoke alarm in the home:Yes     - wears seatbelt: Yes     - Feels safe in their relationships: Yes   Social Determinants of Health   Financial Resource Strain: Low Risk    Difficulty of Paying Living Expenses: Not hard at all  Food Insecurity: No Food Insecurity   Worried About Charity fundraiser in the Last Year: Never true   [place] in the Last Year: Never true   Transportation Needs: No Transportation Needs   Lack of Transportation (Medical): No   Lack of Transportation (Non-Medical): No  Physical Activity: Inactive   Days of Exercise per Week: 0 days   Minutes of Exercise per Session: 0 min  Stress: Not on file  Social Connections: Not on file     Family History: The patient's family history includes Alcohol abuse in his maternal grandmother; Allergies in his brother; Arthritis in his mother; Asthma in his brother, mother, and sister;  Cancer in his maternal grandmother; Hiatal hernia in his maternal grandfather. There is no history of Colon cancer or Esophageal cancer.  ROS:   Please see the history of present illness.    (+) Shortness of breath (+) Chest pain and pressure (+) Pain and coldness in bilateral feet All other systems reviewed and are negative.  EKGs/Labs/Other Studies Reviewed:    The following studies were reviewed today:  Echo 07/24/2017: Study Conclusions   - Left ventricle: The cavity size was normal. Systolic function was    normal. The estimated ejection fraction was in the range of 55%    to 60%. Wall motion was normal; there were no regional wall    motion abnormalities.  - Aortic valve: Transvalvular velocity was within the normal range.    There was no stenosis. There was no regurgitation.  - Mitral valve: Transvalvular velocity was within the normal range.    There was no evidence for stenosis. There was trivial    regurgitation.  - Left atrium: The atrium was severely dilated.  - Right ventricle: The cavity size was normal. Wall thickness was    normal. Systolic function was normal.  - Tricuspid valve: There was no regurgitation.   [place] LE Venous DVT 07/24/2017: Final Interpretation  Right: There is no evidence of deep vein thrombosis in the lower  extremity. No cystic structure found in the popliteal fossa.  Left: There is no evidence of deep vein thrombosis in the lower extremity.  No cystic structure  found in the popliteal fossa.   CTA Chest 01/31/2017: IMPRESSION: 1. Acute bilateral pulmonary emboli, occlusive in RIGHT middle and RIGHT upper lobar artery's. CT evidence of right heart strain (RV/LV Ratio = 1.1) consistent with at least submassive (intermediate risk) PE. The presence of right heart strain has been associated with an increased risk of morbidity and mortality. Please activate Code PE by paging [phone]. 2. Atelectasis without pulmonary infarct. 3. Critical Value/emergent results were called by telephone at the time of interpretation on 01/31/2017 at 10:19 pm to Dr. Brantley Stage , who verbally acknowledged these results.   Aortic Atherosclerosis (ICD10-I70.0).  EKG:    05/19/2021: Sinus rhythm. Rate 64 bpm. LAFB. LVH.  Recent Labs: 07/06/2020: TSH 3.96 12/08/2020: Hemoglobin 15.7; Platelets 257.0 05/17/2021: ALT 44; BUN 16; Creatinine, Ser 0.94; Potassium 4.1; Sodium 137   Recent Lipid Panel    Component Value Date/Time   CHOL 148 05/17/2021 0919   TRIG 149.0 05/17/2021 0919   HDL 37.50 (L) 05/17/2021 0919   CHOLHDL 4 05/17/2021 0919   VLDL 29.8 05/17/2021 0919   LDLCALC 81 05/17/2021 0919   LDLCALC 131 (H) 07/06/2020 1436   LDLDIRECT 133.0 02/17/2021 0835        Physical Exam:    Wt Readings from Last 3 Encounters:  05/19/21 (!) 356 lb 6.4 oz (161.7 kg)  05/17/21 (!) 357 lb (161.9 kg)  12/08/20 (!) 356 lb (161.5 kg)     VS:  BP (!) 154/88 (BP Location: Right Wrist, Patient Position: Sitting, Cuff Size: Normal)   Pulse 64   Ht 6\' 1"  (1.854 m)   Wt (!) 356 lb 6.4 oz (161.7 kg)   BMI 47.02 kg/m  , BMI Body mass index is 47.02 kg/m. GENERAL:  Well appearing HEENT: Pupils equal round and reactive, fundi not visualized, oral mucosa unremarkable NECK:  No jugular venous distention, waveform within normal limits, carotid upstroke brisk and symmetric, no bruits, no thyromegaly LUNGS:  Clear to auscultation bilaterally HEART:  RRR.  PMI not displaced or  sustained,S1 and S2 within normal limits, no S3, no S4, no clicks, no rubs, no murmurs ABD:  Flat, positive bowel sounds normal in frequency in pitch, no bruits, no rebound, no guarding, no midline pulsatile mass, no hepatomegaly, no splenomegaly EXT:  2 plus pulses throughout, bilateral ankle 1+ edema R>L, no cyanosis no clubbing SKIN:  No rashes no nodules NEURO:  Cranial nerves II through XII grossly intact, motor grossly intact throughout PSYCH:  Cognitively intact, oriented to person place and time   ASSESSMENT:    1. Primary hypertension   2. Hyperlipidemia associated with type 2 diabetes mellitus ([place])   3. Obstructive sleep apnea syndrome   4. Pulmonary hypertension ([place])   5. Atypical chest pain   6. Personal history of pulmonary embolism    PLAN:    Hypertension His blood pressure is elevated in the office today but he thinks it has been generally well have been controlled at home.  He is going to get a new machine and start checking his blood pressures regularly.  For now, continue amlodipine, hydrochlorothiazide, and losartan.  Hyperlipidemia associated with type 2 diabetes mellitus (HCC) LDL goal is less than 70.  He is doing well on Zetia and Repatha.  He had significant improvement in his numbers on this regimen.  Obstructive sleep apnea syndrome Continue CPAP.  He is using it regularly.  Pulmonary hypertension ([place]) Pulmonary pressures were severely elevated in 2018 in the setting of pulmonary embolism.  However on follow-up echo in 2019 he did not have any evidence of pulmonary hypertension.  I suspect that this resolved with treatment of his PE.  He does have reasons for pulmonary hypertension including asthma, obesity, sleep apnea, and prior PE.  However these have all been appropriately managed.  It is reasonable to repeat his echo to ensure that there has not been any change.  Atypical chest pain Adrian [name] does get substernal chest pain.  It does occur with  exertion, but is usually followed by belching and relief of his symptoms.  He thinks it is because of his abdominal girth.  We will get a coronary CTA which will allow [place] to look at his coronaries and also assess for any underlying pulmonary pathology that might be contributing to elevated pulmonary pressures.  Personal history of pulmonary embolism He had a single pulmonary embolism in the setting of a car ride from [place] in which she did not stop to take any rest.  It would seem that this to be considered a provoked PE.  He is very conscientious about getting up and walking.  Recommended that he talk with his PCP and pulmonologist about whether he needs to be on lifelong anticoagulation.  Typically stop anticoagulation after 3 to 6 months of therapy.  He does not recall if he had any other hypercoagulable work-up that would make him more prone to recurrent pulmonary embolism.   Disposition: FU with PharmD in 1 month. FU with Kerrick Miler C. Oval Linsey, MD, [facility] in 4 months.  Medication Adjustments/Labs and Tests Ordered: Current medicines are reviewed at length with the patient today.  Concerns regarding medicines are outlined above.   Orders Placed This Encounter  Procedures   CT CORONARY MORPH W/CTA COR W/SCORE W/CA W/CM &/OR WO/CM   Basic metabolic panel   AMB Referral to Heartcare Pharm-D   EKG 12-Lead   ECHOCARDIOGRAM COMPLETE    Meds ordered this encounter  Medications   metoprolol tartrate (LOPRESSOR) 50 MG tablet  Sig: TAKE 1 TABLET 2 HOURS PRIOR TO CT    Dispense:  1 tablet    Refill:  0     Patient Instructions  Medication Instructions:  TAKE METOPROLOL 50 MG 1 TABLET 2 HOURS PRIOR TO CT   *If you need a refill on your cardiac medications before your next appointment, please call your pharmacy*  Lab Work: BMET 1 WEEK PRIOR TO CARDIAC CT   If you have labs (blood work) drawn today and your tests are completely normal, you will receive your results only by: [place] (if you have MyChart) OR A paper copy in the mail If you have any lab test that is abnormal or we need to change your treatment, we will call you to review the results.  Testing/Procedures: Your physician has requested that you have cardiac CT. Cardiac computed tomography (CT) is a painless test that uses an x-ray machine to take clear, detailed pictures of your heart. For further information please visit [email or web address]. Please follow instruction sheet as given.  Your physician has requested that you have an echocardiogram. Echocardiography is a painless test that uses sound waves to create images of your heart. It provides your doctor with information about the size and shape of your heart and how well your heart's chambers and valves are working. This procedure takes approximately one hour. There are no restrictions for this procedure.   Follow-Up: At [facility], you and your health needs are our priority.  As part of our continuing mission to provide you with exceptional heart care, we have created designated Provider Care Teams.  These Care Teams include your primary Cardiologist (physician) and Advanced Practice Providers (APPs -  Physician Assistants and Nurse Practitioners) who all work together to provide you with the care you need, when you need it.  We recommend signing up for the patient portal called "MyChart".  Sign up information is provided on this After Visit Summary.  MyChart is used to connect with patients for Virtual Visits (Telemedicine).  Patients are able to view lab/test results, encounter notes, upcoming appointments, etc.  Non-urgent messages can be sent to your provider as well.   To learn more about what you can do with MyChart, go to [email or web address].    Your next appointment:   2-3 month(s)  The format for your next appointment:   In Person  Provider:   Skeet Latch, MD  Your physician recommends that you schedule a follow-up  appointment in: PHARM D IN 1 MONTH   Other Instructions  Consider getting an [place] BP cuff MONITOR AND LOG BLOOD PRESSURE. BRING READINGS AND MACHINE TO FOLLOW UP     Your cardiac CT will be scheduled at one of the below locations:   [facility] [address] [place], [place] [address] [phone]  [place] [address] [place],  [address] [phone]  If scheduled at [facility], please arrive at the [facility] main entrance (entrance A) of [facility] 30 minutes prior to test start time. You can use the FREE valet parking offered at the main entrance (encouraged to control the heart rate for the test) Proceed to the [facility] Radiology Department (first floor) to check-in and test prep.  If scheduled at [facility], please arrive 15 mins early for check-in and test prep.  Please follow these instructions carefully (unless otherwise directed):  Hold all erectile dysfunction medications at least 3 days (  72 hrs) prior to test.  On the Night Before the Test: Be sure to Drink plenty of water. Do not consume any caffeinated/decaffeinated beverages or chocolate 12 hours prior to your test. Do not take any antihistamines 12 hours prior to your test. If the patient has contrast allergy: Patient will need a prescription for Prednisone and very clear instructions (as follows): Prednisone 50 mg - take 13 hours prior to test Take another Prednisone 50 mg 7 hours prior to test Take another Prednisone 50 mg 1 hour prior to test Take Benadryl 50 mg 1 hour prior to test Patient must complete all four doses of above prophylactic medications. Patient will need a ride after test due to Benadryl.  On the Day of the Test: Drink plenty of water until 1 hour prior to the test. Do not eat any food 4 hours prior to the test. You may take your regular medications prior to  the test.  Take metoprolol (Lopressor) two hours prior to test. HOLD Furosemide/Hydrochlorothiazide morning of the test. FEMALES- please wear underwire-free bra if available, avoid dresses & tight clothing  After the Test: Drink plenty of water. After receiving IV contrast, you may experience a mild flushed feeling. This is normal. On occasion, you may experience a mild rash up to 24 hours after the test. This is not dangerous. If this occurs, you can take Benadryl 25 mg and increase your fluid intake. If you experience trouble breathing, this can be serious. If it is severe call 911 IMMEDIATELY. If it is mild, please call our office. If you take any of these medications: Glipizide/Metformin, Avandament, Glucavance, please do not take 48 hours after completing test unless otherwise instructed.  Please allow 2-4 weeks for scheduling of routine cardiac CTs. Some insurance companies require a pre-authorization which may delay scheduling of this test.   For non-scheduling related questions, please contact the cardiac imaging nurse navigator should you have any questions/concerns: Marchia Bond, Cardiac Imaging Nurse Navigator Gordy Clement, Cardiac Imaging Nurse Navigator [facility] Direct Office Dial: [phone]   For scheduling needs, including cancellations and rescheduling, please call [place], [phone].  Cardiac CT Angiogram A cardiac CT angiogram is a procedure to look at the heart and the area around the heart. It may be done to help find the cause of chest pains or other symptoms of heart disease. During this procedure, a substance called contrast dye is injected into the blood vessels in the area to be checked. A large X-ray machine, called a CT scanner, then takes detailed pictures of the heart and the surrounding area. The procedure is also sometimes called a coronary CT angiogram, coronary artery scanning, or CTA. A cardiac CT angiogram allows the  health care provider to see how well blood is flowing to and from the heart. The health care provider will be able to see if there are any problems, such as: Blockage or narrowing of the coronary arteries in the heart. Fluid around the heart. Signs of weakness or disease in the muscles, valves, and tissues of the heart. Tell a health care provider about: Any allergies you have. This is especially important if you have had a previous allergic reaction to contrast dye. All medicines you are taking, including vitamins, herbs, eye drops, creams, and over-the-counter medicines. Any blood disorders you have. Any surgeries you have had. Any medical conditions you have. Whether you are pregnant or may be pregnant. Any anxiety disorders, chronic pain, or other conditions you have that  may increase your stress or prevent you from lying still. What are the risks? Generally, this is a safe procedure. However, problems may occur, including: Bleeding. Infection. Allergic reactions to medicines or dyes. Damage to other structures or organs. Kidney damage from the contrast dye that is used. Increased risk of cancer from radiation exposure. This risk is low. Talk with your health care provider about: The risks and benefits of testing. How you can receive the lowest dose of radiation. What happens before the procedure? Wear comfortable clothing and remove any jewelry, glasses, dentures, and hearing aids. Follow instructions from your health care provider about eating and drinking. This may include: For 12 hours before the procedure -- avoid caffeine. This includes tea, coffee, soda, energy drinks, and diet pills. Drink plenty of water or other fluids that do not have caffeine in them. Being well hydrated can prevent complications. For 4-6 hours before the procedure -- stop eating and drinking. The contrast dye can cause nausea, but this is less likely if your stomach is empty. Ask your health care provider  about changing or stopping your regular medicines. This is especially important if you are taking diabetes medicines, blood thinners, or medicines to treat problems with erections (erectile dysfunction). What happens during the procedure?  Hair on your chest may need to be removed so that small sticky patches called electrodes can be placed on your chest. These will transmit information that helps to monitor your heart during the procedure. An IV will be inserted into one of your veins. You might be given a medicine to control your heart rate during the procedure. This will help to ensure that good images are obtained. You will be asked to lie on an exam table. This table will slide in and out of the CT machine during the procedure. Contrast dye will be injected into the IV. You might feel warm, or you may get a metallic taste in your mouth. You will be given a medicine called nitroglycerin. This will relax or dilate the arteries in your heart. The table that you are lying on will move into the CT machine tunnel for the scan. The person running the machine will give you instructions while the scans are being done. You may be asked to: Keep your arms above your head. Hold your breath. Stay very still, even if the table is moving. When the scanning is complete, you will be moved out of the machine. The IV will be removed. The procedure may vary among health care providers and hospitals. What can I expect after the procedure? After your procedure, it is common to have: A metallic taste in your mouth from the contrast dye. A feeling of warmth. A headache from the nitroglycerin. Follow these instructions at home: Take over-the-counter and prescription medicines only as told by your health care provider. If you are told, drink enough fluid to keep your urine pale yellow. This will help to flush the contrast dye out of your body. Most people can return to their normal activities right after the  procedure. Ask your health care provider what activities are safe for you. It is up to you to get the results of your procedure. Ask your health care provider, or the department that is doing the procedure, when your results will be ready. Keep all follow-up visits as told by your health care provider. This is important. Contact a health care provider if: You have any symptoms of allergy to the contrast dye. These include: Shortness of  breath. Rash or hives. A racing heartbeat. Summary A cardiac CT angiogram is a procedure to look at the heart and the area around the heart. It may be done to help find the cause of chest pains or other symptoms of heart disease. During this procedure, a large X-ray machine, called a CT scanner, takes detailed pictures of the heart and the surrounding area after a contrast dye has been injected into blood vessels in the area. Ask your health care provider about changing or stopping your regular medicines before the procedure. This is especially important if you are taking diabetes medicines, blood thinners, or medicines to treat erectile dysfunction. If you are told, drink enough fluid to keep your urine pale yellow. This will help to flush the contrast dye out of your body. This information is not intended to replace advice given to you by your health care provider. Make sure you discuss any questions you have with your health care provider. Document Revised: 03/04/2019 Document Reviewed: 03/04/2019 Elsevier Patient Education  2022 [place].    [employer],acting as a Education administrator for Skeet Latch, MD.,have documented all relevant documentation on the behalf of Skeet Latch, MD,as directed by  Skeet Latch, MD while in the presence of Skeet Latch, MD.   I, [place] Oval Linsey, MD have reviewed all documentation for this visit.  The documentation of the exam, diagnosis, procedures, and orders on 05/19/2021 are all accurate and  complete.   Signed, Skeet Latch, MD  05/19/2021 3:26 PM    [facility]

## 2021-05-19 NOTE — Assessment & Plan Note (Signed)
Pulmonary pressures were severely elevated in 2018 in the setting of pulmonary embolism.  However on follow-up echo in 2019 he did not have any evidence of pulmonary hypertension.  I suspect that this resolved with treatment of his PE.  He does have reasons for pulmonary hypertension including asthma, obesity, sleep apnea, and prior PE.  However these have all been appropriately managed.  It is reasonable to repeat his echo to ensure that there has not been any change.

## 2021-05-19 NOTE — Assessment & Plan Note (Signed)
Continue CPAP.  He is using it regularly. 

## 2021-05-19 NOTE — Patient Instructions (Signed)
Medication Instructions:  TAKE METOPROLOL 50 MG 1 TABLET 2 HOURS PRIOR TO CT   *If you need a refill on your cardiac medications before your next appointment, please call your pharmacy*  Lab Work: BMET 1 WEEK PRIOR TO CARDIAC CT   If you have labs (blood work) drawn today and your tests are completely normal, you will receive your results only by: [place] (if you have MyChart) OR A paper copy in the mail If you have any lab test that is abnormal or we need to change your treatment, we will call you to review the results.  Testing/Procedures: Your physician has requested that you have cardiac CT. Cardiac computed tomography (CT) is a painless test that uses an x-ray machine to take clear, detailed pictures of your heart. For further information please visit [email or web address].[email or web address]. Please follow instruction sheet as given.  Your physician has requested that you have an echocardiogram. Echocardiography is a painless test that uses sound waves to create images of your heart. It provides your doctor with information about the size and shape of your heart and how well your heart's chambers and valves are working. This procedure takes approximately one hour. There are no restrictions for this procedure.   Follow-Up: At [facility], you and your health needs are our priority.  As part of our continuing mission to provide you with exceptional heart care, we have created designated Provider Care Teams.  These Care Teams include your primary Cardiologist (physician) and Advanced Practice Providers (APPs -  Physician Assistants and Nurse Practitioners) who all work together to provide you with the care you need, when you need it.  We recommend signing up for the patient portal called "MyChart".  Sign up information is provided on this After Visit Summary.  MyChart is used to connect with patients for Virtual Visits (Telemedicine).  Patients are able to view lab/test results, encounter notes,  upcoming appointments, etc.  Non-urgent messages can be sent to your provider as well.   To learn more about what you can do with MyChart, go to [email or web address].    Your next appointment:   2-3 month(s)  The format for your next appointment:   In Person  Provider:   Skeet Latch, MD  Your physician recommends that you schedule a follow-up appointment in: PHARM D IN 1 MONTH   Other Instructions  Consider getting an Omron BP cuff MONITOR AND LOG BLOOD PRESSURE. BRING READINGS AND MACHINE TO FOLLOW UP     Your cardiac CT will be scheduled at one of the below locations:   [place] [address] [place], [place] [address] [phone]  [place] [address] [place], [place] [address] [phone]  If scheduled at [facility], please arrive at the [facility] main entrance (entrance A) of [facility] 30 minutes prior to test start time. You can use the FREE valet parking offered at the main entrance (encouraged to control the heart rate for the test) Proceed to the [facility] Radiology Department (first floor) to check-in and test prep.  If scheduled at [facility], please arrive 15 mins early for check-in and test prep.  Please follow these instructions carefully (unless otherwise directed):  Hold all erectile dysfunction medications at least 3 days (72 hrs) prior to test.  On the Night Before the Test: Be sure to Drink plenty of water. Do not consume any caffeinated/decaffeinated beverages or chocolate 12 hours prior  to your test. Do not take any antihistamines 12 hours prior to your test. If the patient has contrast allergy: Patient will need a prescription for Prednisone and very clear instructions (as follows): Prednisone 50 mg - take 13 hours prior to test Take another Prednisone 50 mg 7 hours prior to test Take another Prednisone 50 mg 1  hour prior to test Take Benadryl 50 mg 1 hour prior to test Patient must complete all four doses of above prophylactic medications. Patient will need a ride after test due to Benadryl.  On the Day of the Test: Drink plenty of water until 1 hour prior to the test. Do not eat any food 4 hours prior to the test. You may take your regular medications prior to the test.  Take metoprolol (Lopressor) two hours prior to test. HOLD Furosemide/Hydrochlorothiazide morning of the test. FEMALES- please wear underwire-free bra if available, avoid dresses & tight clothing  After the Test: Drink plenty of water. After receiving IV contrast, you may experience a mild flushed feeling. This is normal. On occasion, you may experience a mild rash up to 24 hours after the test. This is not dangerous. If this occurs, you can take Benadryl 25 mg and increase your fluid intake. If you experience trouble breathing, this can be serious. If it is severe call 911 IMMEDIATELY. If it is mild, please call our office. If you take any of these medications: Glipizide/Metformin, Avandament, Glucavance, please do not take 48 hours after completing test unless otherwise instructed.  Please allow 2-4 weeks for scheduling of routine cardiac CTs. Some insurance companies require a pre-authorization which may delay scheduling of this test.   For non-scheduling related questions, please contact the cardiac imaging nurse navigator should you have any questions/concerns: Marchia Bond, Cardiac Imaging Nurse Navigator Gordy Clement, Cardiac Imaging Nurse Navigator [facility] Direct Office Dial: [phone]   For scheduling needs, including cancellations and rescheduling, please call [place], [phone].  Cardiac CT Angiogram A cardiac CT angiogram is a procedure to look at the heart and the area around the heart. It may be done to help find the cause of chest pains or other symptoms of heart  disease. During this procedure, a substance called contrast dye is injected into the blood vessels in the area to be checked. A large X-ray machine, called a CT scanner, then takes detailed pictures of the heart and the surrounding area. The procedure is also sometimes called a coronary CT angiogram, coronary artery scanning, or CTA. A cardiac CT angiogram allows the health care provider to see how well blood is flowing to and from the heart. The health care provider will be able to see if there are any problems, such as: Blockage or narrowing of the coronary arteries in the heart. Fluid around the heart. Signs of weakness or disease in the muscles, valves, and tissues of the heart. Tell a health care provider about: Any allergies you have. This is especially important if you have had a previous allergic reaction to contrast dye. All medicines you are taking, including vitamins, herbs, eye drops, creams, and over-the-counter medicines. Any blood disorders you have. Any surgeries you have had. Any medical conditions you have. Whether you are pregnant or may be pregnant. Any anxiety disorders, chronic pain, or other conditions you have that may increase your stress or prevent you from lying still. What are the risks? Generally, this is a safe procedure. However, problems may occur, including: Bleeding. Infection. Allergic reactions to  medicines or dyes. Damage to other structures or organs. Kidney damage from the contrast dye that is used. Increased risk of cancer from radiation exposure. This risk is low. Talk with your health care provider about: The risks and benefits of testing. How you can receive the lowest dose of radiation. What happens before the procedure? Wear comfortable clothing and remove any jewelry, glasses, dentures, and hearing aids. Follow instructions from your health care provider about eating and drinking. This may include: For 12 hours before the procedure -- avoid  caffeine. This includes tea, coffee, soda, energy drinks, and diet pills. Drink plenty of water or other fluids that do not have caffeine in them. Being well hydrated can prevent complications. For 4-6 hours before the procedure -- stop eating and drinking. The contrast dye can cause nausea, but this is less likely if your stomach is empty. Ask your health care provider about changing or stopping your regular medicines. This is especially important if you are taking diabetes medicines, blood thinners, or medicines to treat problems with erections (erectile dysfunction). What happens during the procedure?  Hair on your chest may need to be removed so that small sticky patches called electrodes can be placed on your chest. These will transmit information that helps to monitor your heart during the procedure. An IV will be inserted into one of your veins. You might be given a medicine to control your heart rate during the procedure. This will help to ensure that good images are obtained. You will be asked to lie on an exam table. This table will slide in and out of the CT machine during the procedure. Contrast dye will be injected into the IV. You might feel warm, or you may get a metallic taste in your mouth. You will be given a medicine called nitroglycerin. This will relax or dilate the arteries in your heart. The table that you are lying on will move into the CT machine tunnel for the scan. The person running the machine will give you instructions while the scans are being done. You may be asked to: Keep your arms above your head. Hold your breath. Stay very still, even if the table is moving. When the scanning is complete, you will be moved out of the machine. The IV will be removed. The procedure may vary among health care providers and hospitals. What can I expect after the procedure? After your procedure, it is common to have: A metallic taste in your mouth from the contrast dye. A feeling  of warmth. A headache from the nitroglycerin. Follow these instructions at home: Take over-the-counter and prescription medicines only as told by your health care provider. If you are told, drink enough fluid to keep your urine pale yellow. This will help to flush the contrast dye out of your body. Most people can return to their normal activities right after the procedure. Ask your health care provider what activities are safe for you. It is up to you to get the results of your procedure. Ask your health care provider, or the department that is doing the procedure, when your results will be ready. Keep all follow-up visits as told by your health care provider. This is important. Contact a health care provider if: You have any symptoms of allergy to the contrast dye. These include: Shortness of breath. Rash or hives. A racing heartbeat. Summary A cardiac CT angiogram is a procedure to look at the heart and the area around the heart. It may be done  to help find the cause of chest pains or other symptoms of heart disease. During this procedure, a large X-ray machine, called a CT scanner, takes detailed pictures of the heart and the surrounding area after a contrast dye has been injected into blood vessels in the area. Ask your health care provider about changing or stopping your regular medicines before the procedure. This is especially important if you are taking diabetes medicines, blood thinners, or medicines to treat erectile dysfunction. If you are told, drink enough fluid to keep your urine pale yellow. This will help to flush the contrast dye out of your body. This information is not intended to replace advice given to you by your health care provider. Make sure you discuss any questions you have with your health care provider. Document Revised: 03/04/2019 Document Reviewed: 03/04/2019 Elsevier Patient Education  [place].

## 2021-05-19 NOTE — Assessment & Plan Note (Signed)
LDL goal is less than 70.  He is doing well on Zetia and Repatha.  He had significant improvement in his numbers on this regimen.

## 2021-05-22 DIAGNOSIS — G4733 Obstructive sleep apnea (adult) (pediatric): Secondary | ICD-10-CM | POA: Diagnosis not present

## 2021-05-25 ENCOUNTER — Ambulatory Visit (INDEPENDENT_AMBULATORY_CARE_PROVIDER_SITE_OTHER): Payer: Federal, State, Local not specified - PPO

## 2021-05-25 ENCOUNTER — Telehealth: Payer: Self-pay | Admitting: Family Medicine

## 2021-05-25 ENCOUNTER — Other Ambulatory Visit: Payer: Self-pay

## 2021-05-25 DIAGNOSIS — I272 Pulmonary hypertension, unspecified: Secondary | ICD-10-CM

## 2021-05-25 DIAGNOSIS — Z86711 Personal history of pulmonary embolism: Secondary | ICD-10-CM | POA: Diagnosis not present

## 2021-05-25 DIAGNOSIS — R0789 Other chest pain: Secondary | ICD-10-CM | POA: Diagnosis not present

## 2021-05-25 DIAGNOSIS — G4733 Obstructive sleep apnea (adult) (pediatric): Secondary | ICD-10-CM

## 2021-05-25 DIAGNOSIS — I10 Essential (primary) hypertension: Secondary | ICD-10-CM | POA: Diagnosis not present

## 2021-05-25 DIAGNOSIS — E1169 Type 2 diabetes mellitus with other specified complication: Secondary | ICD-10-CM

## 2021-05-25 LAB — ECHOCARDIOGRAM COMPLETE
Area-P 1/2: 2.46 cm2
S' Lateral: 3.87 cm

## 2021-05-25 MED ORDER — PERFLUTREN LIPID MICROSPHERE
1.0000 mL | INTRAVENOUS | Status: AC | PRN
  Administered 2021-05-25: 1 mL via INTRAVENOUS

## 2021-05-25 NOTE — Telephone Encounter (Signed)
Please advise if pt can schedule appt for NV to ensure machine is accurate.

## 2021-05-25 NOTE — Telephone Encounter (Signed)
Pt called and said that he just got a new blood pressure machine and he checked it for the first time yesterday and it showed 165/100 and he said that is high for him so he wasn't sure if it was right so he wanted to know if he could come by to have it checked as well as bring by his machine to compare. Is this okay?

## 2021-05-26 NOTE — Telephone Encounter (Signed)
Pt scheduled for NV 

## 2021-05-26 NOTE — Telephone Encounter (Signed)
Ok to schedule nurse visit for BP recheck and monitor comparison.

## 2021-05-29 ENCOUNTER — Ambulatory Visit: Payer: Federal, State, Local not specified - PPO

## 2021-05-29 ENCOUNTER — Telehealth (HOSPITAL_COMMUNITY): Payer: Self-pay | Admitting: *Deleted

## 2021-05-29 ENCOUNTER — Other Ambulatory Visit: Payer: Self-pay

## 2021-05-29 VITALS — BP 133/84 | HR 63

## 2021-05-29 DIAGNOSIS — I10 Essential (primary) hypertension: Secondary | ICD-10-CM

## 2021-05-29 NOTE — Telephone Encounter (Signed)
Reaching out to patient to offer assistance regarding upcoming cardiac imaging study; pt verbalizes understanding of appt date/time, parking situation and where to check in, pre-test NPO status and medications ordered, and verified current allergies; name and call back number provided for further questions should they arise  Gordy Clement RN Navigator Cardiac Imaging [facility] [phone] office [phone] cell  Patient to take 50mg  metoprolol tartrate two hours prior to cardiac CT scan.

## 2021-05-29 NOTE — Progress Notes (Addendum)
Adrian [name] is a 60 y.o. male presents to the office today for Blood pressure recheck secondary to elevated BP. BP 133/84 HR 63 medication: amlodipine 5mg   qid, Hydrochlorothiazide 25mg  qd, Losartan 100mg  qd  If on medication, Last dose was at least 1-2 hours prior to recheck: Yes Blood pressure was taken in the right arm after patient rested for 5 minutes.  There were no vitals taken for this visit.  Kavin Leech

## 2021-05-30 ENCOUNTER — Ambulatory Visit (HOSPITAL_COMMUNITY)
Admission: RE | Admit: 2021-05-30 | Discharge: 2021-05-30 | Disposition: A | Discharge status: Home / Self Care | Payer: Federal, State, Local not specified - PPO | Source: Ambulatory Visit | Attending: Cardiovascular Disease | Admitting: Cardiovascular Disease

## 2021-05-30 DIAGNOSIS — Z86711 Personal history of pulmonary embolism: Secondary | ICD-10-CM | POA: Diagnosis not present

## 2021-05-30 DIAGNOSIS — I272 Pulmonary hypertension, unspecified: Secondary | ICD-10-CM | POA: Diagnosis not present

## 2021-05-30 DIAGNOSIS — G4733 Obstructive sleep apnea (adult) (pediatric): Secondary | ICD-10-CM | POA: Insufficient documentation

## 2021-05-30 DIAGNOSIS — R0789 Other chest pain: Secondary | ICD-10-CM | POA: Diagnosis not present

## 2021-05-30 DIAGNOSIS — E1169 Type 2 diabetes mellitus with other specified complication: Secondary | ICD-10-CM | POA: Diagnosis not present

## 2021-05-30 DIAGNOSIS — I10 Essential (primary) hypertension: Secondary | ICD-10-CM | POA: Insufficient documentation

## 2021-05-30 DIAGNOSIS — E785 Hyperlipidemia, unspecified: Secondary | ICD-10-CM | POA: Insufficient documentation

## 2021-05-30 MED ORDER — IOHEXOL 350 MG/ML SOLN
100.0000 mL | Freq: Once | INTRAVENOUS | Status: AC | PRN
  Administered 2021-05-30: 100 mL via INTRAVENOUS

## 2021-05-30 MED ORDER — NITROGLYCERIN 0.4 MG SL SUBL: 0.8000 mg | SUBLINGUAL_TABLET | Freq: Once | SUBLINGUAL | Status: AC

## 2021-05-30 MED ORDER — NITROGLYCERIN 0.4 MG SL SUBL
SUBLINGUAL_TABLET | SUBLINGUAL | Status: AC
  Administered 2021-05-30: 0.8 mg via SUBLINGUAL
  Filled 2021-05-30: qty 2

## 2021-06-07 ENCOUNTER — Ambulatory Visit (HOSPITAL_BASED_OUTPATIENT_CLINIC_OR_DEPARTMENT_OTHER): Payer: Federal, State, Local not specified - PPO | Admitting: Pharmacist Clinician (PhC)/ Clinical Pharmacy Specialist

## 2021-06-07 ENCOUNTER — Other Ambulatory Visit: Payer: Self-pay

## 2021-06-07 ENCOUNTER — Encounter (HOSPITAL_BASED_OUTPATIENT_CLINIC_OR_DEPARTMENT_OTHER): Payer: Self-pay | Admitting: Pharmacist Clinician (PhC)/ Clinical Pharmacy Specialist

## 2021-06-07 DIAGNOSIS — I10 Essential (primary) hypertension: Secondary | ICD-10-CM | POA: Diagnosis not present

## 2021-06-07 NOTE — Patient Instructions (Signed)
Return for a a follow up appointment with Dr. Oval Linsey in January  Check your blood pressure at home daily  and keep record of the readings.  Take your BP meds as follows:  We will recommend increasing your amlodipine to 10 mg daily.  Continue with all other medications for now.   Bring all of your meds, your BP cuff and your record of home blood pressures to your next appointment.  Exercise as you're able, try to walk approximately 30 minutes per day.  Keep salt intake to a minimum, especially watch canned and prepared boxed foods.  Eat more fresh fruits and vegetables and fewer canned items.  Avoid eating in fast food restaurants.    HOW TO TAKE YOUR BLOOD PRESSURE: Rest 5 minutes before taking your blood pressure.  Don't smoke or drink caffeinated beverages for at least 30 minutes before. Take your blood pressure before (not after) you eat. Sit comfortably with your back supported and both feet on the floor (don't cross your legs). Elevate your arm to heart level on a table or a desk. Use the proper sized cuff. It should fit smoothly and snugly around your bare upper arm. There should be enough room to slip a fingertip under the cuff. The bottom edge of the cuff should be 1 inch above the crease of the elbow. Ideally, take 3 measurements at one sitting and record the average.

## 2021-06-07 NOTE — Assessment & Plan Note (Addendum)
Patient's blood pressure is uncontrolled at home and in office today. Due to this, we suggested increasing his amlodipine to 10 mg today but he said he would like to wait until tomorrow to see his PCP before any dose changes are made. We reviewed the proper way to take blood pressure and encouraged him to continue to take his blood pressure at home. Will follow up with PCP tomorrow.

## 2021-06-07 NOTE — Progress Notes (Signed)
06/07/2021 Adrian [name] September 27, 1960 [id number]   HPI:  Adrian [name] is a 60 y.o. male patient of Dr Oval Linsey, with a PMH below who presents today for hypertension clinic evaluation. At his last visit with Dr. Oval Linsey on 05/17/21, his blood pressure was 154/88. Also at that visit with Dr. Oval Linsey, no medication changes were made because prior to, all home and in office blood pressure readings were at goal. It was recommended that he take and record his blood pressure and bring that to his next appointment with the pharmacist.   Past Medical History: HTN Losartan 100 mg, Amlodipine 5 mg, HCTZ 25 mg   HLD Zetia 10 mg   T2DM 12/08/20 A1c 6.8 on Farxiga 10 mg, Metformin 1000 mg BIDWM     Blood Pressure Goal:  130/80  Current Medications: Losartan 100 mg, Amlodipine 5 mg, HCTZ 25 mg  Family Hx: No pertinent history of heart disease   Social Hx: Never smoker, no illicit drug use, drinks 1-2 bottles of wine per week  Diet: Does home cooking, will do fish 1-2x/week but mostly chicken; vegetables are mostly fresh or frozen; will does have a double shot of espresso in latte every day  Exercise: Does work around the house but does not do routine physical exercise   Home BP readings: Patient has had issues with new Omron blood pressure cuff so did not bring in any home readings today.   Intolerances: Lisinopril (cough)  Labs:  10/28: Scr 0.94, K 4.1, Na 137, Glu 161, BUN 16, GFR 89, P 64 Wt Readings from Last 3 Encounters:  06/07/21 (!) 355 lb (161 kg)  05/19/21 (!) 356 lb 6.4 oz (161.7 kg)  05/17/21 (!) 357 lb (161.9 kg)   BP Readings from Last 3 Encounters:  06/07/21 (!) 148/92  05/30/21 125/75  05/29/21 133/84   Pulse Readings from Last 3 Encounters:  06/07/21 68  05/30/21 63  05/29/21 63    Current Outpatient Medications  Medication Sig Dispense Refill   amLODipine (NORVASC) 5 MG tablet Take 1 tablet (5 mg total) by mouth daily. 90 tablet 1   budesonide (PULMICORT)  180 MCG/ACT inhaler Inhale 2 puffs into the lungs in the morning and at bedtime. 1 each 11   cetirizine (ZYRTEC) 10 MG tablet Take 1 tablet (10 mg total) by mouth daily. 30 tablet 11   cyclobenzaprine (FLEXERIL) 10 MG tablet TAKE 1 TABLET BY MOUTH EVERYDAY AT BEDTIME PRN 30 tablet 5   dapagliflozin propanediol (FARXIGA) 5 MG TABS tablet Take 1 tablet (5 mg total) by mouth daily before breakfast. 90 tablet 1   Evolocumab (REPATHA) 140 MG/ML SOSY Inject 1 mL into the skin every 14 (fourteen) days. 6 mL 3   ezetimibe (ZETIA) 10 MG tablet Take 1 tablet (10 mg total) by mouth daily. 90 tablet 3   fluticasone (FLONASE) 50 MCG/ACT nasal spray Place into both nostrils daily.     gabapentin (NEURONTIN) 300 MG capsule TAKE 1 CAPSULE BY MOUTH EVERYDAY AT BEDTIME 90 capsule 2   hydrochlorothiazide (HYDRODIURIL) 25 MG tablet Take 1 tablet (25 mg total) by mouth daily. 90 tablet 1   losartan (COZAAR) 100 MG tablet Take 1 tablet (100 mg total) by mouth daily. 90 tablet 1   metFORMIN (GLUCOPHAGE) 1000 MG tablet Take 1 tablet (1,000 mg total) by mouth 2 (two) times daily with a meal. 180 tablet 1   montelukast (SINGULAIR) 10 MG tablet Take 1 tablet (10 mg total) by mouth at bedtime.  90 tablet 3   rivaroxaban (XARELTO) 10 MG TABS tablet Take 1 tablet (10 mg total) by mouth daily. 90 tablet 1   UNABLE TO FIND Med Name: CPAP     No current facility-administered medications for this visit.    Allergies  Allergen Reactions   Atorvastatin     Other reaction(s): myalgias   Crestor [Rosuvastatin]     Other reaction(s): severe foot pains   Lisinopril Cough    Other reaction(s): cough   Statins Other (See Comments)    Causes pain/ foot trouble    Past Medical History:  Diagnosis Date   Adenomatous colon polyp    FS 02/2004; colo 2005 hyperplastic polyp; 2010 neg - butmucosal prolapse.    Allergy    Asthma    Atypical chest pain 05/19/2021   Blood in stool    Chicken pox    Colon polyps    Diabetes  mellitus without complication ([place]) 29/4765   Diverticulosis    Frequent headaches    Genital warts    GERD (gastroesophageal reflux disease)    Hemorrhoid    Hepatic steatosis 04/2019   Elevated liver enzymes-full work-up for infectious disease process with ultrasound normal with the exception of hepatic steatosis   Hyperlipidemia    Hypertension    Hypogonadism in male    labs x2: 11/14/2015 - 216 and 11/17/2015 193; declined med at that time.    Insomnia    OSA on CPAP 09/18/2016   HST 09/18/2016; ESS-4; AHI-76; O2 min- 61%   Pulmonary embolism ([place]) 01/2017   provoked by long care ride, on xarelto, est Dr. Melvyn Novas   Thyroid disease    subclinical hypothyroidism per records   Assessment and Plan: Patient's blood pressure is uncontrolled at home and in office today. Due to this, we suggested increasing his amlodipine to 10 mg today but he said he would like to wait until tomorrow to see his PCP before any dose changes are made. We reviewed the proper way to take blood pressure and encouraged him to continue to take his blood pressure at home. Will follow up with PCP tomorrow.  Dorian Furnace PharmD Candidate [facility]  Class of 2023  I was with patient and student for entire interview and agree with above assessment and plan.  Tommy Medal PharmD CPP [facility] [address] [place], [place] [address] [phone]

## 2021-06-08 ENCOUNTER — Encounter: Payer: Self-pay | Admitting: Family Medicine

## 2021-06-08 ENCOUNTER — Ambulatory Visit (INDEPENDENT_AMBULATORY_CARE_PROVIDER_SITE_OTHER): Payer: Federal, State, Local not specified - PPO | Admitting: Family Medicine

## 2021-06-08 VITALS — BP 128/76 | HR 65 | Temp 98.4°F | Ht 73.0 in | Wt 356.0 lb

## 2021-06-08 DIAGNOSIS — M546 Pain in thoracic spine: Secondary | ICD-10-CM

## 2021-06-08 DIAGNOSIS — Z7901 Long term (current) use of anticoagulants: Secondary | ICD-10-CM

## 2021-06-08 DIAGNOSIS — E559 Vitamin D deficiency, unspecified: Secondary | ICD-10-CM | POA: Diagnosis not present

## 2021-06-08 DIAGNOSIS — E1169 Type 2 diabetes mellitus with other specified complication: Secondary | ICD-10-CM

## 2021-06-08 DIAGNOSIS — R931 Abnormal findings on diagnostic imaging of heart and coronary circulation: Secondary | ICD-10-CM | POA: Diagnosis not present

## 2021-06-08 DIAGNOSIS — E782 Mixed hyperlipidemia: Secondary | ICD-10-CM

## 2021-06-08 DIAGNOSIS — R748 Abnormal levels of other serum enzymes: Secondary | ICD-10-CM

## 2021-06-08 DIAGNOSIS — Z0001 Encounter for general adult medical examination with abnormal findings: Secondary | ICD-10-CM

## 2021-06-08 DIAGNOSIS — E038 Other specified hypothyroidism: Secondary | ICD-10-CM

## 2021-06-08 DIAGNOSIS — G8929 Other chronic pain: Secondary | ICD-10-CM

## 2021-06-08 DIAGNOSIS — Z125 Encounter for screening for malignant neoplasm of prostate: Secondary | ICD-10-CM | POA: Diagnosis not present

## 2021-06-08 DIAGNOSIS — Z23 Encounter for immunization: Secondary | ICD-10-CM

## 2021-06-08 DIAGNOSIS — Z1211 Encounter for screening for malignant neoplasm of colon: Secondary | ICD-10-CM | POA: Diagnosis not present

## 2021-06-08 DIAGNOSIS — Z860100 Personal history of colon polyps, unspecified: Secondary | ICD-10-CM

## 2021-06-08 DIAGNOSIS — E785 Hyperlipidemia, unspecified: Secondary | ICD-10-CM | POA: Diagnosis not present

## 2021-06-08 DIAGNOSIS — Z86711 Personal history of pulmonary embolism: Secondary | ICD-10-CM

## 2021-06-08 DIAGNOSIS — I700 Atherosclerosis of aorta: Secondary | ICD-10-CM

## 2021-06-08 DIAGNOSIS — I10 Essential (primary) hypertension: Secondary | ICD-10-CM | POA: Diagnosis not present

## 2021-06-08 DIAGNOSIS — Z789 Other specified health status: Secondary | ICD-10-CM

## 2021-06-08 DIAGNOSIS — Z6841 Body Mass Index (BMI) 40.0 and over, adult: Secondary | ICD-10-CM

## 2021-06-08 DIAGNOSIS — J4530 Mild persistent asthma, uncomplicated: Secondary | ICD-10-CM | POA: Diagnosis not present

## 2021-06-08 DIAGNOSIS — J301 Allergic rhinitis due to pollen: Secondary | ICD-10-CM

## 2021-06-08 LAB — CBC
HCT: 47.6 % (ref 39.0–52.0)
Hemoglobin: 15.7 g/dL (ref 13.0–17.0)
MCHC: 32.9 g/dL (ref 30.0–36.0)
MCV: 89.3 fl (ref 78.0–100.0)
Platelets: 233 10*3/uL (ref 150.0–400.0)
RBC: 5.33 Mil/uL (ref 4.22–5.81)
RDW: 13.7 % (ref 11.5–15.5)
WBC: 7.9 10*3/uL (ref 4.0–10.5)

## 2021-06-08 LAB — PSA: PSA: 1.31 ng/mL (ref 0.10–4.00)

## 2021-06-08 LAB — TSH: TSH: 3.54 u[IU]/mL (ref 0.35–5.50)

## 2021-06-08 LAB — HEMOGLOBIN A1C: Hgb A1c MFr Bld: 7.2 % — ABNORMAL HIGH (ref 4.6–6.5)

## 2021-06-08 LAB — VITAMIN D 25 HYDROXY (VIT D DEFICIENCY, FRACTURES): VITD: 32.07 ng/mL (ref 30.00–100.00)

## 2021-06-08 MED ORDER — AMLODIPINE BESYLATE 5 MG PO TABS: 5.0000 mg | ORAL_TABLET | Freq: Every day | ORAL | 1 refills | Status: DC

## 2021-06-08 MED ORDER — EZETIMIBE 10 MG PO TABS: 10.0000 mg | ORAL_TABLET | Freq: Every day | ORAL | 3 refills | Status: DC

## 2021-06-08 MED ORDER — LOSARTAN POTASSIUM 100 MG PO TABS: 100.0000 mg | ORAL_TABLET | Freq: Every day | ORAL | 1 refills | Status: DC

## 2021-06-08 MED ORDER — MONTELUKAST SODIUM 10 MG PO TABS: 10.0000 mg | ORAL_TABLET | Freq: Every day | ORAL | 3 refills | Status: DC

## 2021-06-08 MED ORDER — GABAPENTIN 300 MG PO CAPS: ORAL_CAPSULE | ORAL | 2 refills | Status: DC

## 2021-06-08 MED ORDER — RIVAROXABAN 10 MG PO TABS: 10.0000 mg | ORAL_TABLET | Freq: Every day | ORAL | 1 refills | Status: DC

## 2021-06-08 MED ORDER — CYCLOBENZAPRINE HCL 10 MG PO TABS: ORAL_TABLET | ORAL | 5 refills | Status: DC

## 2021-06-08 MED ORDER — BUDESONIDE 180 MCG/ACT IN AEPB: 2.0000 | INHALATION_SPRAY | Freq: Two times a day (BID) | RESPIRATORY_TRACT | 11 refills | Status: DC

## 2021-06-08 MED ORDER — METFORMIN HCL 1000 MG PO TABS
1000.0000 mg | ORAL_TABLET | Freq: Two times a day (BID) | ORAL | 1 refills | Status: DC
Start: 2021-06-08 — End: 2021-12-13

## 2021-06-08 MED ORDER — CETIRIZINE HCL 10 MG PO TABS
10.0000 mg | ORAL_TABLET | Freq: Every day | ORAL | 11 refills | Status: DC
Start: 2021-06-08 — End: 2021-12-13

## 2021-06-08 MED ORDER — HYDROCHLOROTHIAZIDE 25 MG PO TABS: 25.0000 mg | ORAL_TABLET | Freq: Every day | ORAL | 1 refills | Status: DC

## 2021-06-08 NOTE — Progress Notes (Signed)
This visit occurred during the SARS-CoV-2 public health emergency.  Safety protocols were in place, including screening questions prior to the visit, additional usage of staff PPE, and extensive cleaning of exam room while observing appropriate contact time as indicated for disinfecting solutions.    Patient ID: Adrian [name], male  DOB: 12-14-1960, 60 y.o.   MRN: [id number] Patient Care Team    Relationship Specialty Notifications Start End  Ma Hillock, DO PCP - General Family Medicine  02/06/18   Tanda Rockers, MD Consulting Physician Pulmonary Disease  02/07/18   Otelia Sergeant, OD Referring Physician   02/07/18   Lavena Bullion, DO Consulting Physician Gastroenterology  01/20/19     Chief Complaint  Patient presents with   Annual Exam    Pt is fasting    Subjective: Adrian [name] is a 60 y.o. male present for [place]. All past medical history, surgical history, allergies, family history, immunizations, medications and social history were updated in the electronic medical record today. All recent labs, ED visits and hospitalizations within the last year were reviewed.  Health maintenance:  Colonoscopy: last screen 11/23/2014- Buccini in the past, now established with Dr. Bryan Lemma, recommend follow up 7 years> referred to his GI  immunizations:  tdap UTD 2020, influenza updated today, PNA series completed, zostavax completed Infectious disease screening: HIV completed, Hep C completed.  PSA:  Lab Results  Component Value Date   PSA 0.91 06/04/2019  , pt was counseled on prostate cancer screenings.  Assistive device: No Oxygen use: No* Patient has a Dental home. Hospitalizations/ED visits: Reviewed  Essential hypertension/hyperlipidemia/morbid obesity/CKD3 Pt reports  compliance  with losartan 100 mg QD, amlodipine 5 mg and HCTZ 25 mg daily. Patient denies chest pain, shortness of breath, dizziness or lower extremity edema.  Pt is not taking a daily baby ASA. Pt is  not prescribed statin 2/2 intolerance x2. Compliance with zeita and repatha tolerating. Labs due next appointment Diet: low Salt Exercise: Routine exercise RF: Hypertension, hyperlipidemia, obesity, diabetes.    Type 2 diabetes mellitus without complication, without long-term current use of insulin ([place]) Patient reports compliance  with metformin 1000 mg daily daily and farxiga 5 mg qd. Patient denies dizziness, hyperglycemic or hypoglycemic events. Patient denies numbness, tingling in the extremities or nonhealing wounds of feet.  He states he has cut back on his sugar consumption.  He is prescribed gabapentin  300 mg nightly and reports it is very helpful-mostly for back/leg pain. Diabetes diagnosed 2019    Asthma: Patient reports his asthma has been rather stable  Not needing albuterol breakthrough. compliant with Pulmicort and Singulair nightly. He is now taking a daily antihistamine.     Depression screen [facility] 2/9 06/08/2021 05/17/2021 07/06/2020 04/07/2020 06/04/2019  Decreased Interest 0 0 0 0 0  Down, Depressed, Hopeless 0 0 0 0 0  PHQ - 2 Score 0 0 0 0 0   No flowsheet data found.    Fall Risk  04/07/2020 08/19/2018 04/23/2018 02/06/2018  Falls in the past year? 0 0 No No  Number falls in past yr: 0 - - -  Injury with Fall? 0 - - -  Follow up Falls evaluation completed - - -    Immunization History  Administered Date(s) Administered   Influenza Split 05/03/2009, 05/19/2010, 04/27/2011, 05/02/2012, 05/18/2013   Influenza Whole 04/22/2017   Influenza,inj,Quad PF,6+ Mos 05/20/2015, 05/17/2016, 04/24/2017, 05/12/2018, 05/15/2019, 04/06/2020, 06/08/2021   Influenza,inj,quad, With Preservative 05/12/2014, 05/20/2015   Moderna Sars-Covid-2 Vaccination  11/23/2019, 12/28/2019, 07/30/2020   Pneumococcal Conjugate-13 05/12/2018   Pneumococcal Polysaccharide-23 02/02/2017   Td 07/24/2003   Tdap 11/13/2010, 06/04/2019   Zoster Recombinat (Shingrix) 06/04/2019, 10/27/2019   Past  Medical History:  Diagnosis Date   Adenomatous colon polyp    FS 02/2004; colo 2005 hyperplastic polyp; 2010 neg - butmucosal prolapse.    Allergy    Asthma    Atypical chest pain 05/19/2021   Blood in stool    Chicken pox    Colon polyps    Diabetes mellitus without complication ([place]) 95/2841   Diverticulosis    Frequent headaches    Genital warts    GERD (gastroesophageal reflux disease)    Hemorrhoid    Hepatic steatosis 04/2019   Elevated liver enzymes-full work-up for infectious disease process with ultrasound normal with the exception of hepatic steatosis   Hyperlipidemia    Hypertension    Hypogonadism in male    labs x2: 11/14/2015 - 216 and 11/17/2015 193; declined med at that time.    Insomnia    OSA on CPAP 09/18/2016   HST 09/18/2016; ESS-4; AHI-76; O2 min- 61%   Pulmonary embolism ([place]) 01/2017   provoked by long care ride, on xarelto, est Dr. Melvyn Novas   Thyroid disease    subclinical hypothyroidism per records   Allergies  Allergen Reactions   Atorvastatin     Other reaction(s): myalgias   Crestor [Rosuvastatin]     Other reaction(s): severe foot pains   Lisinopril Cough    Other reaction(s): cough   Statins Other (See Comments)    Causes pain/ foot trouble   Past Surgical History:  Procedure Laterality Date   ANKLE SURGERY     COLONOSCOPY  2016   dr Cristina Gong    ESOPHAGOGASTRODUODENOSCOPY     with dr Cristina Gong    Family History  Problem Relation Age of Onset   Asthma Mother    Arthritis Mother    Asthma Sister    Asthma Brother    Allergies Brother    Alcohol abuse Maternal Grandmother    Cancer Maternal Grandmother    Hiatal hernia Maternal Grandfather    Colon cancer Neg Hx    Esophageal cancer Neg Hx    Social History   Social History Narrative   Marital status/children/pets: Married.   Education/employment: Associates degree, employed as a Armed forces operational officer:      -Wears a bicycle helmet riding a bike: No     -smoke alarm in the  home:Yes     - wears seatbelt: Yes     - Feels safe in their relationships: Yes    Allergies as of 06/08/2021       Reactions   Atorvastatin    Other reaction(s): myalgias   Crestor [rosuvastatin]    Other reaction(s): severe foot pains   Lisinopril Cough   Other reaction(s): cough   Statins Other (See Comments)   Causes pain/ foot trouble        Medication List        Accurate as of June 08, 2021 12:28 PM. If you have any questions, ask your nurse or doctor.          amLODipine 5 MG tablet Commonly known as: NORVASC Take 1 tablet (5 mg total) by mouth daily.   budesonide 180 MCG/ACT inhaler Commonly known as: PULMICORT Inhale 2 puffs into the lungs in the morning and at bedtime.   cetirizine 10 MG tablet Commonly known as: ZYRTEC Take 1 tablet (  10 mg total) by mouth daily.   cyclobenzaprine 10 MG tablet Commonly known as: FLEXERIL TAKE 1 TABLET BY MOUTH EVERYDAY AT BEDTIME PRN   dapagliflozin propanediol 5 MG Tabs tablet Commonly known as: Farxiga Take 1 tablet (5 mg total) by mouth daily before breakfast.   ezetimibe 10 MG tablet Commonly known as: ZETIA Take 1 tablet (10 mg total) by mouth daily.   fluticasone 50 MCG/ACT nasal spray Commonly known as: FLONASE Place into both nostrils daily.   gabapentin 300 MG capsule Commonly known as: NEURONTIN TAKE 1 CAPSULE BY MOUTH EVERYDAY AT BEDTIME   hydrochlorothiazide 25 MG tablet Commonly known as: HYDRODIURIL Take 1 tablet (25 mg total) by mouth daily.   losartan 100 MG tablet Commonly known as: COZAAR Take 1 tablet (100 mg total) by mouth daily.   metFORMIN 1000 MG tablet Commonly known as: GLUCOPHAGE Take 1 tablet (1,000 mg total) by mouth 2 (two) times daily with a meal.   montelukast 10 MG tablet Commonly known as: SINGULAIR Take 1 tablet (10 mg total) by mouth at bedtime.   Repatha 140 MG/ML S[place] Generic drug: Evolocumab Inject 1 mL into the skin every 14 (fourteen) days.    rivaroxaban 10 MG Tabs tablet Commonly known as: Xarelto Take 1 tablet (10 mg total) by mouth daily.   UNABLE TO FIND Med Name: CPAP       All past medical history, surgical history, allergies, family history, immunizations andmedications were updated in the EMR today and reviewed under the history and medication portions of their EMR.      CT CORONARY MORPH W/CTA COR W/SCORE W/CA W/CM &/OR WO/CM Addendum Date: 05/30/2021   IMPRESSION: 1. Coronary calcium score of 163. This was 57 percentile for age and sex matched control. 2. Normal coronary origin with right dominance. 3. CAD-RADS 1. Minimal non-obstructive CAD (0-24%). Consider non-atherosclerotic causes of chest pain. Consider preventive therapy and risk factor modification. Berniece Salines, DO [facility] Electronically Signed   By: Berniece Salines D.O.   On: 05/30/2021 14:29   Result Date: 05/30/2021 IMPRESSION: 1.  Aortic Atherosclerosis (ICD10-I70.0). 2. Hepatic steatosis. Electronically Signed: By: Vinnie Langton M.D. On: 05/30/2021 13:40    ROS: 14 pt review of systems performed and negative (unless mentioned in an HPI)  Objective: BP 128/76   Pulse 65   Temp 98.4 F (36.9 C) (Oral)   Ht 6\' 1"  (1.854 m)   Wt (!) 356 lb (161.5 kg)   SpO2 96%   BMI 46.97 kg/m  Gen: Afebrile. No acute distress. Nontoxic in appearance, well-developed, well-nourished,  very pleasant obese male.  HENT: AT. [place]. Bilateral TM visualized and normal in appearance, normal external auditory canal. MMM, no oral lesions, adequate dentition. Bilateral nares within normal limits. Throat without erythema, ulcerations or exudates. no Cough on exam, no hoarseness on exam. Eyes:Pupils Equal Round Reactive to light, Extraocular movements intact,  Conjunctiva without redness, discharge or icterus. Neck/lymp/endocrine: Supple,no lymphadenopathy, no thyromegaly CV: RRR no murmur, no edema, +2/4 P posterior tibialis pulses.  Chest: CTAB, no wheeze, rhonchi or crackles.  normal Respiratory effort. good Air movement. Abd: Soft. obese. NTND. BS present. no Masses palpated. No hepatosplenomegaly. No rebound tenderness or guarding. Skin: no rashes, purpura or petechiae. Warm and well-perfused. Skin intact. Neuro/Msk:  Normal gait. PERLA. EOMi. Alert. Oriented x3.  Cranial nerves II through XII intact. Muscle strength 5/5 upper/lower extremity. DTRs equal bilaterally. Psych: Normal affect, dress and demeanor. Normal speech. Normal thought content and judgment.  No results found.  Assessment/plan: Adrian [name] is a 60 y.o. male present for CPE Elevated liver enzymes/Hepatic steatosis -Acute hepatitis and autoimmune hepatitis, HIV>negative -Abdominal ultrasound > hepatic steatosis.  -L FTs have been stable .  Reviewed labs from last month.   Type 2 diabetes mellitus with hyperlipidemia Last labs were at goal of less than 7. -Continue metformin 1000 milligrams to twice daily dosing.  -Continue farxiga 5 mg qd-would increase if needing additional coverage. Statin intolerant > continue seita and repatha Continue diet and exercise modifications  PNA series: Pneumonia series completed Flu shot: Up-to-date (recommneded yearly) Microalbumin: on ARB Foot exam: Completed 11/2020 Eye exam: 04/2020 Dr. Vivien Rota will make his appointment and have records sent to [place]. A1c: 6.6 >>5.8>>6.3>> 7.2>7.1> 8.9 >7.7> 6.8> A1c collected today   Mild persistent asthma without complication/Non-seasonal allergic rhinitis due to pollen Stable Continue Pulmicort 1-2 puffs BID.(fornualry changed from Asmanex) Continue Singulair 10 mg daily.  Continue Zyrtec 10 mg nightly   Chronic thoracic back pain: Stable Continue gabapentin nightly  Continue Flexeril 10 mg nightly as needed.     H/o PE- chronic anticoagulation- acquired thrombophilia(lifetime) Continue xarelto - cbc collected today   Essential hypertension/morbid obesity/HLD< 70 ldl goal/Aortic atherosclerosis/statin  intolerant/morbid obesity-Body mass index is 46.97 kg/m./[place] coronary artery calcium score between 100 and 199 (163- 78%) Stable - Goal :130/80 with is DM history.  Continue losartan 100 mg daily. Continue HCTZ 25 Continue amlodipine 5 mg daily Continue Zetia 10 mg daily Continue Repatha injections every 2 weeks.  Lipids responded well. Low sodium diet Diet and exercise changes recommended Cbc, tsh collected today  Subclincal hypothyroidism Tsh collected today Hypercalcemia/vit d def - VITAMIN D 25 Hydroxy (Vit-D Deficiency, Fractures) Prostate cancer screening - PSA Colon cancer screening - Ambulatory referral to Gastroenterology  Encounter for general adult preventative visit with abnormal findings Colonoscopy: last screen 11/23/2014- Buccini in the past, now established with Dr. Bryan Lemma, recommend follow up 7 years> referred to his GI  immunizations:  tdap UTD 2020, influenza updated today, PNA series completed, zostavax completed Infectious disease screening: HIV completed, Hep C completed. Patient was encouraged to exercise greater than 150 minutes a week. Patient was encouraged to choose a diet filled with fresh fruits and vegetables, and lean meats. AVS provided to patient today for education/recommendation on gender specific health and safety maintenance. Return in about 5 months (around 11/22/2021) for CMC (30 min).   Orders Placed This Encounter  Procedures   Flu Vaccine QUAD 6+ mos PF IM (Fluarix Quad PF)   CBC   Hemoglobin A1c   PSA   TSH   VITAMIN D 25 Hydroxy (Vit-D Deficiency, Fractures)   Ambulatory referral to Gastroenterology   Meds ordered this encounter  Medications   amLODipine (NORVASC) 5 MG tablet    Sig: Take 1 tablet (5 mg total) by mouth daily.    Dispense:  90 tablet    Refill:  1   budesonide (PULMICORT) 180 MCG/ACT inhaler    Sig: Inhale 2 puffs into the lungs in the morning and at bedtime.    Dispense:  1 each    Refill:  11    cetirizine (ZYRTEC) 10 MG tablet    Sig: Take 1 tablet (10 mg total) by mouth daily.    Dispense:  30 tablet    Refill:  11   cyclobenzaprine (FLEXERIL) 10 MG tablet    Sig: TAKE 1 TABLET BY MOUTH EVERYDAY AT BEDTIME PRN    Dispense:  30 tablet    Refill:  5   ezetimibe (ZETIA) 10 MG tablet    Sig: Take 1 tablet (10 mg total) by mouth daily.    Dispense:  90 tablet    Refill:  3   gabapentin (NEURONTIN) 300 MG capsule    Sig: TAKE 1 CAPSULE BY MOUTH EVERYDAY AT BEDTIME    Dispense:  90 capsule    Refill:  2   hydrochlorothiazide (HYDRODIURIL) 25 MG tablet    Sig: Take 1 tablet (25 mg total) by mouth daily.    Dispense:  90 tablet    Refill:  1   losartan (COZAAR) 100 MG tablet    Sig: Take 1 tablet (100 mg total) by mouth daily.    Dispense:  90 tablet    Refill:  1    Please dc all other losartan scripts and remind pt of changes and only take one of these now. Thank you so much!   metFORMIN (GLUCOPHAGE) 1000 MG tablet    Sig: Take 1 tablet (1,000 mg total) by mouth 2 (two) times daily with a meal.    Dispense:  180 tablet    Refill:  1   montelukast (SINGULAIR) 10 MG tablet    Sig: Take 1 tablet (10 mg total) by mouth at bedtime.    Dispense:  90 tablet    Refill:  3   rivaroxaban (XARELTO) 10 MG TABS tablet    Sig: Take 1 tablet (10 mg total) by mouth daily.    Dispense:  90 tablet    Refill:  1   Referral Orders         Ambulatory referral to Gastroenterology       Note is dictated utilizing voice recognition software. Although note has been proof read prior to signing, occasional typographical errors still can be missed. If any questions arise, please do not hesitate to call for verification.  Electronically signed by: Howard Pouch, DO [place]

## 2021-06-08 NOTE — Patient Instructions (Addendum)
Great to see you today.  I have refilled the medication(s) we provide.   If labs were collected, we will inform you of lab results once received either by echart message or telephone call.   - echart message- for normal results that have been seen by the patient already.   - telephone call: abnormal results or if patient has not viewed results in their echart.   [facility] Address:  [address], [place], [place] [address] Phone: [phone]   Health Maintenance, Male Adopting a healthy lifestyle and getting preventive care are important in promoting health and wellness. Ask your health care provider about: The right schedule for you to have regular tests and exams. Things you can do on your own to prevent diseases and keep yourself healthy. What should I know about diet, weight, and exercise? Eat a healthy diet  Eat a diet that includes plenty of vegetables, fruits, low-fat dairy products, and lean protein. Do not eat a lot of foods that are high in solid fats, added sugars, or sodium. Maintain a healthy weight Body mass index (BMI) is a measurement that can be used to identify possible weight problems. It estimates body fat based on height and weight. Your health care provider can help determine your BMI and help you achieve or maintain a healthy weight. Get regular exercise Get regular exercise. This is one of the most important things you can do for your health. Most adults should: Exercise for at least 150 minutes each week. The exercise should increase your heart rate and make you sweat (moderate-intensity exercise). Do strengthening exercises at least twice a week. This is in addition to the moderate-intensity exercise. Spend less time sitting. Even light physical activity can be beneficial. Watch cholesterol and blood lipids Have your blood tested for lipids and cholesterol at 59 years of age, then have this test every 5 years. You may need to have your  cholesterol levels checked more often if: Your lipid or cholesterol levels are high. You are older than 59 years of age. You are at high risk for heart disease. What should I know about cancer screening? Many types of cancers can be detected early and may often be prevented. Depending on your health history and family history, you may need to have cancer screening at various ages. This may include screening for: Colorectal cancer. Prostate cancer. Skin cancer. Lung cancer. What should I know about heart disease, diabetes, and high blood pressure? Blood pressure and heart disease High blood pressure causes heart disease and increases the risk of stroke. This is more likely to develop in people who have high blood pressure readings or are overweight. Talk with your health care provider about your target blood pressure readings. Have your blood pressure checked: Every 3-5 years if you are 31-32 years of age. Every year if you are 49 years old or older. If you are between the ages of 78 and 67 and are a current or former smoker, ask your health care provider if you should have a one-time screening for abdominal aortic aneurysm (AAA). Diabetes Have regular diabetes screenings. This checks your fasting blood sugar level. Have the screening done: Once every three years after age 58 if you are at a normal weight and have a low risk for diabetes. More often and at a younger age if you are overweight or have a high risk for diabetes. What should I know about preventing infection? Hepatitis B If you have a higher risk for hepatitis  B, you should be screened for this virus. Talk with your health care provider to find out if you are at risk for hepatitis B infection. Hepatitis C Blood testing is recommended for: Everyone born from [birth year] through 1964. Anyone with known risk factors for hepatitis C. Sexually transmitted infections (STIs) You should be screened each year for STIs, including gonorrhea  and chlamydia, if: You are sexually active and are younger than 59 years of age. You are older than 59 years of age and your health care provider tells you that you are at risk for this type of infection. Your sexual activity has changed since you were last screened, and you are at increased risk for chlamydia or gonorrhea. Ask your health care provider if you are at risk. Ask your health care provider about whether you are at high risk for HIV. Your health care provider may recommend a prescription medicine to help prevent HIV infection. If you choose to take medicine to prevent HIV, you should first get tested for HIV. You should then be tested every 3 months for as long as you are taking the medicine. Follow these instructions at home: Alcohol use Do not drink alcohol if your health care provider tells you not to drink. If you drink alcohol: Limit how much you have to 0-2 drinks a day. Know how much alcohol is in your drink. In the [place] one drink equals one 12 oz bottle of beer (355 mL), one 5 oz glass of wine (148 mL), or one 1 oz glass of hard liquor (44 mL). Lifestyle Do not use any products that contain nicotine or tobacco. These products include cigarettes, chewing tobacco, and vaping devices, such as e-cigarettes. If you need help quitting, ask your health care provider. Do not use street drugs. Do not share needles. Ask your health care provider for help if you need support or information about quitting drugs. General instructions Schedule regular health, dental, and eye exams. Stay current with your vaccines. Tell your health care provider if: You often feel depressed. You have ever been abused or do not feel safe at home. Summary Adopting a healthy lifestyle and getting preventive care are important in promoting health and wellness. Follow your health care provider's instructions about healthy diet, exercising, and getting tested or screened for diseases. Follow your health  care provider's instructions on monitoring your cholesterol and blood pressure. This information is not intended to replace advice given to you by your health care provider. Make sure you discuss any questions you have with your health care provider. Document Revised: 11/28/2020 Document Reviewed: 11/28/2020 Elsevier Patient Education  [place].

## 2021-06-09 ENCOUNTER — Other Ambulatory Visit: Payer: Self-pay | Admitting: Family Medicine

## 2021-06-09 MED ORDER — DAPAGLIFLOZIN PROPANEDIOL 10 MG PO TABS: 10.0000 mg | ORAL_TABLET | Freq: Every day | ORAL | 1 refills | Status: DC

## 2021-06-21 DIAGNOSIS — G4733 Obstructive sleep apnea (adult) (pediatric): Secondary | ICD-10-CM | POA: Diagnosis not present

## 2021-06-29 DIAGNOSIS — G4733 Obstructive sleep apnea (adult) (pediatric): Secondary | ICD-10-CM | POA: Diagnosis not present

## 2021-07-04 ENCOUNTER — Other Ambulatory Visit: Payer: Self-pay | Admitting: Family Medicine

## 2021-07-22 DIAGNOSIS — G4733 Obstructive sleep apnea (adult) (pediatric): Secondary | ICD-10-CM | POA: Diagnosis not present

## 2021-07-27 ENCOUNTER — Encounter (HOSPITAL_BASED_OUTPATIENT_CLINIC_OR_DEPARTMENT_OTHER): Payer: Self-pay

## 2021-07-27 NOTE — Progress Notes (Signed)
Cardiology Office Note:    Date:  07/29/2021   ID:  Adrian [name], DOB [birth year], MRN [id number]  PCP:  Ma Hillock, DO   [facility] Providers Cardiologist:  None     Referring MD: Ma Hillock, DO   No chief complaint on file.  History of Present Illness:    Adrian [name] is a 61 y.o. male with a hx of hyperlipidemia, hypertension, diabetes mellitus, GERD, asthma, OSA on CPAP, pulmonary embolism (2018), hepatic steatosis, and hypogonadism, here for follow-up. He was initially seen 05/19/2021 for the evaluation of high pulmonary arterial pressure. He had an echo in 07/2017 with LVEF 55-60%. Right ventricular function was normal, and PASP could not be calculated because there was no tricuspid regurgitation. Prior to that he had an echo 01/2017 with PASP 61 mmHg. He has a history of statin intolerance but does well with Repatha.     At his last appointment, [name] reported ongoing exertional dyspnea for 20 years. He also noted constant chest pain/pressure, and full abdominal expansion while walking (pressure relieved with belching). At night he would use 2 L oxygen with his CPAP. A coronary CTA 05/2021 showed mildly dilated aorta (38.6 mm) with minimal calcifications at the aortic root. There was a minimal (<24%) calcified plaque in the proximal LAD, and diffuse minimal calcifications throughout the proximal and mid LCX. Coronary calcium score of 163. This was 78 percentile. He followed up with our pharmacist 06/07/21 and his BP was 148/92 on Losartan 100 mg, Amlodipine 5 mg, HCTZ 25 mg. He was advised to increase amlodipine to 10 mg, but he deferred until he could follow-up with his PCP the following day. At that visit with his PCP his BP was 128/76, no medication changes were made.  Today, he is feeling good overall with no new concerns. He does not monitor his blood pressure at home. In clinic today his BP is 142/90 on recheck, but he notes he has not taken his antihypertensives  yet this morning. Typically he takes his medication before or at 10 AM, and his nightly dose before he goes to bed. He is tolerating Repatha well. Lately he has not been exercising formally. He stays active with chores around the house, and has myalgias the following day. He climbed the attic stairs multiple times yesterday, but with no issues. He denies any palpitations, chest pain, or shortness of breath. No lightheadedness, headaches, syncope, orthopnea, PND, or lower extremity edema.   Past Medical History:  Diagnosis Date   Adenomatous colon polyp    FS 02/2004; colo 2005 hyperplastic polyp; 2010 neg - butmucosal prolapse.    Allergy    Asthma    Atypical chest pain 05/19/2021   Blood in stool    CAD in native artery 07/28/2021   Chicken pox    Colon polyps    Diabetes mellitus without complication ([place]) 81/1914   Diverticulosis    Frequent headaches    Genital warts    GERD (gastroesophageal reflux disease)    Hemorrhoid    Hepatic steatosis 04/2019   Elevated liver enzymes-full work-up for infectious disease process with ultrasound normal with the exception of hepatic steatosis   Hyperlipidemia    Hypertension    Hypogonadism in male    labs x2: 11/14/2015 - 216 and 11/17/2015 193; declined med at that time.    Insomnia    OSA on CPAP 09/18/2016   HST 09/18/2016; ESS-4; AHI-76; O2 min- 61%   Pulmonary embolism ([place]) 01/2017  provoked by long care ride, on xarelto, est Dr. Melvyn Novas   Thyroid disease    subclinical hypothyroidism per records    Past Surgical History:  Procedure Laterality Date   ANKLE SURGERY     COLONOSCOPY  2016   dr Cristina Gong    ESOPHAGOGASTRODUODENOSCOPY     with dr Cristina Gong     Current Medications: Current Meds  Medication Sig   amLODipine (NORVASC) 5 MG tablet Take 1 tablet (5 mg total) by mouth daily.   budesonide (PULMICORT) 180 MCG/ACT inhaler Inhale 2 puffs into the lungs in the morning and at bedtime.   cetirizine (ZYRTEC) 10 MG tablet Take 1  tablet (10 mg total) by mouth daily.   cyclobenzaprine (FLEXERIL) 10 MG tablet TAKE 1 TABLET BY MOUTH EVERYDAY AT BEDTIME PRN   dapagliflozin propanediol (FARXIGA) 10 MG TABS tablet Take 1 tablet (10 mg total) by mouth daily before breakfast.   Evolocumab (REPATHA) 140 MG/ML SOSY Inject 1 mL into the skin every 14 (fourteen) days.   ezetimibe (ZETIA) 10 MG tablet Take 1 tablet (10 mg total) by mouth daily.   fluticasone (FLONASE) 50 MCG/ACT nasal spray Place into both nostrils daily.   gabapentin (NEURONTIN) 300 MG capsule TAKE 1 CAPSULE BY MOUTH EVERYDAY AT BEDTIME   hydrochlorothiazide (HYDRODIURIL) 25 MG tablet Take 1 tablet (25 mg total) by mouth daily.   losartan (COZAAR) 100 MG tablet Take 1 tablet (100 mg total) by mouth daily.   metFORMIN (GLUCOPHAGE) 1000 MG tablet Take 1 tablet (1,000 mg total) by mouth 2 (two) times daily with a meal.   montelukast (SINGULAIR) 10 MG tablet Take 1 tablet (10 mg total) by mouth at bedtime.   rivaroxaban (XARELTO) 10 MG TABS tablet Take 1 tablet (10 mg total) by mouth daily.   UNABLE TO FIND Med Name: CPAP     Allergies:   Atorvastatin, Crestor [rosuvastatin], Lisinopril, and Statins   Social History   Socioeconomic History   Marital status: Married    Spouse name: Not on file   Number of children: 2   Years of education: Not on file   Highest education level: Not on file  Occupational History   Not on file  Tobacco Use   Smoking status: Never   Smokeless tobacco: Never  Vaping Use   Vaping Use: Never used  Substance and Sexual Activity   Alcohol use: Yes    Alcohol/week: 3.0 standard drinks    Types: 3 Glasses of wine per week   Drug use: No   Sexual activity: Yes    Partners: Female  Other Topics Concern   Not on file  Social History Narrative   Marital status/children/pets: Married.   Education/employment: Associates degree, employed as a Armed forces operational officer:      -Wears a bicycle helmet riding a bike: No     -smoke  alarm in the home:Yes     - wears seatbelt: Yes     - Feels safe in their relationships: Yes   Social Determinants of Health   Financial Resource Strain: Low Risk    Difficulty of Paying Living Expenses: Not hard at all  Food Insecurity: No Food Insecurity   Worried About Charity fundraiser in the Last Year: Never true   [place] in the Last Year: Never true  Transportation Needs: No Transportation Needs   Lack of Transportation (Medical): No   Lack of Transportation (Non-Medical): No  Physical Activity: Inactive   Days of Exercise  per Week: 0 days   Minutes of Exercise per Session: 0 min  Stress: Not on file  Social Connections: Not on file     Family History: The patient's family history includes Alcohol abuse in his maternal grandmother; Allergies in his brother; Arthritis in his mother; Asthma in his brother, mother, and sister; Cancer in his maternal grandmother; Hiatal hernia in his maternal grandfather. There is no history of Colon cancer or Esophageal cancer.  ROS:   Please see the history of present illness.    (+) Myalgias All other systems reviewed and are negative.  EKGs/Labs/Other Studies Reviewed:    The following studies were reviewed today:  Coronary CTA/Calcium Score 05/30/2021: IMPRESSION: 1. Coronary calcium score of 163. This was 13 percentile for age and sex matched control.   2. Normal coronary origin with right dominance.   3. CAD-RADS 1. Minimal non-obstructive CAD (0-24%). Consider non-atherosclerotic causes of chest pain. Consider preventive therapy and risk factor modification.  Echo 05/25/2021: Sonographer Comments: Technically difficult study due to poor echo windows and patient is morbidly obese. Image acquisition challenging due to patient body habitus.  IMPRESSIONS    1. Very difficult study due to obesity. Overall, EF appears preserved  ~55-60% but difficult to assess for WMA despite contrast. LV appears  mildly dilated in PLAX  but very difficult windows. LV appears normal in  other views.   2. Left ventricular ejection fraction, by estimation, is 60 to 65%. The  left ventricle has normal function. Left ventricular endocardial border  not optimally defined to evaluate regional wall motion. The left  ventricular internal cavity size was mildly  dilated. Left ventricular diastolic parameters were normal.   3. Right ventricular systolic function is normal. The right ventricular  size is mildly enlarged. Tricuspid regurgitation signal is inadequate for  assessing PA pressure.   4. The mitral valve is grossly normal. No evidence of mitral valve  regurgitation. No evidence of mitral stenosis.   5. The aortic valve is tricuspid. Aortic valve regurgitation is not  visualized. No aortic stenosis is present.   6. Aortic dilatation noted. There is mild dilatation of the aortic root,  measuring 40 mm.   7. The inferior vena cava is normal in size with greater than 50%  respiratory variability, suggesting right atrial pressure of 3 mmHg.  Echo 07/24/2017: Study Conclusions   - Left ventricle: The cavity size was normal. Systolic function was    normal. The estimated ejection fraction was in the range of 55%    to 60%. Wall motion was normal; there were no regional wall    motion abnormalities.  - Aortic valve: Transvalvular velocity was within the normal range.    There was no stenosis. There was no regurgitation.  - Mitral valve: Transvalvular velocity was within the normal range.    There was no evidence for stenosis. There was trivial    regurgitation.  - Left atrium: The atrium was severely dilated.  - Right ventricle: The cavity size was normal. Wall thickness was    normal. Systolic function was normal.  - Tricuspid valve: There was no regurgitation.   [place] LE Venous DVT 07/24/2017: Final Interpretation  Right: There is no evidence of deep vein thrombosis in the lower  extremity. No cystic structure found in the  popliteal fossa.  Left: There is no evidence of deep vein thrombosis in the lower extremity.  No cystic structure found in the popliteal fossa.   CTA Chest 01/31/2017: IMPRESSION: 1. Acute bilateral  pulmonary emboli, occlusive in RIGHT middle and RIGHT upper lobar artery's. CT evidence of right heart strain (RV/LV Ratio = 1.1) consistent with at least submassive (intermediate risk) PE. The presence of right heart strain has been associated with an increased risk of morbidity and mortality. Please activate Code PE by paging [phone]. 2. Atelectasis without pulmonary infarct. 3. Critical Value/emergent results were called by telephone at the time of interpretation on 01/31/2017 at 10:19 pm to Dr. Brantley Stage , who verbally acknowledged these results.   Aortic Atherosclerosis (ICD10-I70.0).  EKG:    07/28/2021: EKG was not ordered. 05/19/2021: Sinus rhythm. Rate 64 bpm. LAFB. LVH.  Recent Labs: 05/17/2021: ALT 44; BUN 16; Creatinine, Ser 0.94; Potassium 4.1; Sodium 137 06/08/2021: Hemoglobin 15.7; Platelets 233.0; TSH 3.54   Recent Lipid Panel    Component Value Date/Time   CHOL 148 05/17/2021 0919   TRIG 149.0 05/17/2021 0919   HDL 37.50 (L) 05/17/2021 0919   CHOLHDL 4 05/17/2021 0919   VLDL 29.8 05/17/2021 0919   LDLCALC 81 05/17/2021 0919   LDLCALC 131 (H) 07/06/2020 1436   LDLDIRECT 133.0 02/17/2021 0835        Physical Exam:    Wt Readings from Last 3 Encounters:  07/28/21 (!) 358 lb 11.2 oz (162.7 kg)  06/08/21 (!) 356 lb (161.5 kg)  06/07/21 (!) 355 lb (161 kg)     VS:  BP (!) 142/90 (BP Location: Left Arm, Patient Position: Sitting, Cuff Size: Large)    Pulse 67    Ht 6\' 1"  (1.854 m)    Wt (!) 358 lb 11.2 oz (162.7 kg)    SpO2 92%    BMI 47.32 kg/m  , BMI Body mass index is 47.32 kg/m. GENERAL:  Well appearing HEENT: Pupils equal round and reactive, fundi not visualized, oral mucosa unremarkable NECK:  No jugular venous distention, waveform within normal  limits, carotid upstroke brisk and symmetric, no bruits, no thyromegaly LUNGS:  Clear to auscultation bilaterally HEART:  RRR.  PMI not displaced or sustained,S1 and S2 within normal limits, no S3, no S4, no clicks, no rubs, no murmurs ABD:  Flat, positive bowel sounds normal in frequency in pitch, no bruits, no rebound, no guarding, no midline pulsatile mass, no hepatomegaly, no splenomegaly EXT:  2 plus pulses throughout, bilateral ankle 1+ edema R>L, no cyanosis no clubbing SKIN:  No rashes no nodules NEURO:  Cranial nerves II through XII grossly intact, motor grossly intact throughout PSYCH:  Cognitively intact, oriented to person place and time   ASSESSMENT:    1. Primary hypertension   2. Obstructive sleep apnea syndrome   3. CAD in native artery   4. Hyperlipidemia LDL goal <70     PLAN:    Hypertension Blood pressure is above his goal of less than 130/80 both initially and on repeat.  We discussed working on diet and exercise rather than initially increasing his medication.  He is going to work on increasing his exercise to at least 150 minutes weekly.  We will also refer him to the PREP program at the [facility].  Continue amlodipine, HCTZ and losartan.  ConsiderTtribenzor if we need additional control in the future, which would also consolidate his medications.  Obstructive sleep apnea syndrome Continue CPAP.  CAD in native artery Minimal plaque on coronary CT-A 05/2021.  He is  Asymtpomatic.  Lipids are much better controlled on Repatha and  Zetia.  He will work on increasing his exercise.  He isn't on aspirin given that he  is on Xarelto.  Hyperlipidemia LDL goal <70 He doesn't tolerate statins.  Continue Repatha and Zetia.   Disposition: FU with APP in 2 months. FU with Angella Montas C. Oval Linsey, MD, [facility] in 1 year.  Medication Adjustments/Labs and Tests Ordered: Current medicines are reviewed at length with the patient today.  Concerns regarding medicines are outlined above.    Orders Placed This Encounter  Procedures   Amb Referral To Provider Referral Exercise Program (P.R.E.P)   No orders of the defined types were placed in this encounter.  Patient Instructions  Medication Instructions:  Your physician recommends that you continue on your current medications as directed. Please refer to the Current Medication list given to you today.   *If you need a refill on your cardiac medications before your next appointment, please call your pharmacy*  Lab Work: NONE   Testing/Procedures: NONE  Follow-Up: At [facility], you and your health needs are our priority.  As part of our continuing mission to provide you with exceptional heart care, we have created designated Provider Care Teams.  These Care Teams include your primary Cardiologist (physician) and Advanced Practice Providers (APPs -  Physician Assistants and Nurse Practitioners) who all work together to provide you with the care you need, when you need it.  We recommend signing up for the patient portal called "MyChart".  Sign up information is provided on this After Visit Summary.  MyChart is used to connect with patients for Virtual Visits (Telemedicine).  Patients are able to view lab/test results, encounter notes, upcoming appointments, etc.  Non-urgent messages can be sent to your provider as well.   To learn more about what you can do with MyChart, go to [email or web address].    Your next appointment:   12 month(s)  The format for your next appointment:   In Person  Provider:   Skeet Latch, MD  Your physician recommends that you schedule a follow-up appointment in: 2 MONTHS WITH CAITLIN W NP    YOU HAVE BEEN REFERRED TO PREP PROGRAM  IF YOU DO NOT HEAR FROM [facility] OR [facility] IN 2 WEEKS CALL THE OFFICE TO FOLLOW UP   Other Instructions MONITOR AND LOG YOUR BLOOD PRESSURE DAILY, BRING READINGS AND MACHINE TO FOLLOW UP   Exercise recommendations: The American Heart Association  recommends 150 minutes of moderate intensity exercise weekly. Try 30 minutes of moderate intensity exercise 4-5 times per week. This could include walking, jogging, or swimming.    I,Mathew Stumpf,acting as a Education administrator for Skeet Latch, MD.,have documented all relevant documentation on the behalf of Skeet Latch, MD,as directed by  Skeet Latch, MD while in the presence of Skeet Latch, MD.  I, [place] Oval Linsey, MD have reviewed all documentation for this visit.  The documentation of the exam, diagnosis, procedures, and orders on 07/29/2021 are all accurate and complete.  Signed, Skeet Latch, MD  07/29/2021 8:21 AM     [facility]

## 2021-07-28 ENCOUNTER — Other Ambulatory Visit: Payer: Self-pay

## 2021-07-28 ENCOUNTER — Encounter (HOSPITAL_BASED_OUTPATIENT_CLINIC_OR_DEPARTMENT_OTHER): Payer: Self-pay | Admitting: Cardiovascular Disease

## 2021-07-28 ENCOUNTER — Ambulatory Visit (HOSPITAL_BASED_OUTPATIENT_CLINIC_OR_DEPARTMENT_OTHER): Payer: Federal, State, Local not specified - PPO | Admitting: Cardiovascular Disease

## 2021-07-28 ENCOUNTER — Telehealth: Payer: Self-pay

## 2021-07-28 DIAGNOSIS — G4733 Obstructive sleep apnea (adult) (pediatric): Secondary | ICD-10-CM | POA: Diagnosis not present

## 2021-07-28 DIAGNOSIS — I10 Essential (primary) hypertension: Secondary | ICD-10-CM | POA: Diagnosis not present

## 2021-07-28 DIAGNOSIS — E785 Hyperlipidemia, unspecified: Secondary | ICD-10-CM

## 2021-07-28 DIAGNOSIS — I2510 Atherosclerotic heart disease of native coronary artery without angina pectoris: Secondary | ICD-10-CM | POA: Diagnosis not present

## 2021-07-28 HISTORY — DX: Atherosclerotic heart disease of native coronary artery without angina pectoris: I25.10

## 2021-07-28 NOTE — Assessment & Plan Note (Signed)
He doesn't tolerate statins.  Continue Repatha and Zetia.

## 2021-07-28 NOTE — Patient Instructions (Addendum)
Medication Instructions:  Your physician recommends that you continue on your current medications as directed. Please refer to the Current Medication list given to you today.   *If you need a refill on your cardiac medications before your next appointment, please call your pharmacy*  Lab Work: NONE   Testing/Procedures: NONE  Follow-Up: At [facility], you and your health needs are our priority.  As part of our continuing mission to provide you with exceptional heart care, we have created designated Provider Care Teams.  These Care Teams include your primary Cardiologist (physician) and Advanced Practice Providers (APPs -  Physician Assistants and Nurse Practitioners) who all work together to provide you with the care you need, when you need it.  We recommend signing up for the patient portal called "MyChart".  Sign up information is provided on this After Visit Summary.  MyChart is used to connect with patients for Virtual Visits (Telemedicine).  Patients are able to view lab/test results, encounter notes, upcoming appointments, etc.  Non-urgent messages can be sent to your provider as well.   To learn more about what you can do with MyChart, go to [email or web address].    Your next appointment:   12 month(s)  The format for your next appointment:   In Person  Provider:   Skeet Latch, MD  Your physician recommends that you schedule a follow-up appointment in: 2 MONTHS WITH CAITLIN W NP    YOU HAVE BEEN REFERRED TO PREP PROGRAM  IF YOU DO NOT HEAR FROM [facility] OR [facility] IN 2 WEEKS CALL THE OFFICE TO FOLLOW UP   Other Instructions MONITOR AND LOG YOUR BLOOD PRESSURE DAILY, BRING READINGS AND MACHINE TO FOLLOW UP   Exercise recommendations: The American Heart Association recommends 150 minutes of moderate intensity exercise weekly. Try 30 minutes of moderate intensity exercise 4-5 times per week. This could include walking, jogging, or swimming.

## 2021-07-28 NOTE — Assessment & Plan Note (Signed)
Blood pressure is above his goal of less than 130/80 both initially and on repeat.  We discussed working on diet and exercise rather than initially increasing his medication.  He is going to work on increasing his exercise to at least 150 minutes weekly.  We will also refer him to the PREP program at the [facility].  Continue amlodipine, HCTZ and losartan.  ConsiderTtribenzor if we need additional control in the future, which would also consolidate his medications.

## 2021-07-28 NOTE — Assessment & Plan Note (Signed)
Continue CPAP.  

## 2021-07-28 NOTE — Assessment & Plan Note (Signed)
Minimal plaque on coronary CT-A 05/2021.  He is  Asymtpomatic.  Lipids are much better controlled on Repatha and  Zetia.  He will work on increasing his exercise.  He isn't on aspirin given that he is on Xarelto.

## 2021-07-28 NOTE — Telephone Encounter (Signed)
Call to pt reference referral to [place] program to patient and confirmed interest Closest to [facility] and would prefer daytime class Coach from [place] will call him with the next class and start times

## 2021-07-29 ENCOUNTER — Encounter (HOSPITAL_BASED_OUTPATIENT_CLINIC_OR_DEPARTMENT_OTHER): Payer: Self-pay | Admitting: Cardiovascular Disease

## 2021-07-30 DIAGNOSIS — G4733 Obstructive sleep apnea (adult) (pediatric): Secondary | ICD-10-CM | POA: Diagnosis not present

## 2021-08-02 ENCOUNTER — Telehealth: Payer: Self-pay

## 2021-08-02 NOTE — Telephone Encounter (Signed)
Called to discuss PREP program at [employer]; he was in the car on the way to an appointment, but wants to attend; will send him the dates/times for next 3 classes at [facility], he will return call tomorrow to let me know which one he can attend.

## 2021-08-09 ENCOUNTER — Telehealth: Payer: Self-pay

## 2021-08-09 NOTE — Telephone Encounter (Signed)
Called to follow up on PREP class and determine which class he may want to attend; left voicemail

## 2021-08-21 DIAGNOSIS — G4733 Obstructive sleep apnea (adult) (pediatric): Secondary | ICD-10-CM | POA: Diagnosis not present

## 2021-08-30 DIAGNOSIS — G4733 Obstructive sleep apnea (adult) (pediatric): Secondary | ICD-10-CM | POA: Diagnosis not present

## 2021-09-19 DIAGNOSIS — G4733 Obstructive sleep apnea (adult) (pediatric): Secondary | ICD-10-CM | POA: Diagnosis not present

## 2021-09-22 NOTE — Progress Notes (Signed)
Office Visit    Patient Name: Adrian [name] Date of Encounter: 09/25/2021  PCP:  Ma Hillock DO   [place]  Cardiologist:  Skeet Latch, MD  Advanced Practice Provider:  No care team member to display Electrophysiologist:  None    Chief Complaint    Adrian [name] is a 61 y.o. male with a hx of hyperlipidemia, hypertension, DM2, GERD, asthma, OSA on CPAP, PE (2018), hepatic steatosis, hypogonadism presents today for blood pressure follow up.    Past Medical History    Past Medical History:  Diagnosis Date   Adenomatous colon polyp    FS 02/2004; colo 2005 hyperplastic polyp; 2010 neg - butmucosal prolapse.    Allergy    Asthma    Atypical chest pain 05/19/2021   Blood in stool    CAD in native artery 07/28/2021   Chicken pox    Colon polyps    Diabetes mellitus without complication ([place]) 44/8185   Diverticulosis    Frequent headaches    Genital warts    GERD (gastroesophageal reflux disease)    Hemorrhoid    Hepatic steatosis 04/2019   Elevated liver enzymes-full work-up for infectious disease process with ultrasound normal with the exception of hepatic steatosis   Hyperlipidemia    Hypertension    Hypogonadism in male    labs x2: 11/14/2015 - 216 and 11/17/2015 193; declined med at that time.    Insomnia    OSA on CPAP 09/18/2016   HST 09/18/2016; ESS-4; AHI-76; O2 min- 61%   Pulmonary embolism ([place]) 01/2017   provoked by long care ride, on xarelto, est Dr. Melvyn Novas   Thyroid disease    subclinical hypothyroidism per records   Past Surgical History:  Procedure Laterality Date   ANKLE SURGERY     COLONOSCOPY  2016   dr Cristina Gong    ESOPHAGOGASTRODUODENOSCOPY     with dr Cristina Gong     Allergies  Allergies  Allergen Reactions   Atorvastatin     Other reaction(s): myalgias   Crestor [Rosuvastatin]     Other reaction(s): severe foot pains   Lisinopril Cough    Other reaction(s): cough   Statins Other (See Comments)    Causes  pain/ foot trouble    History of Present Illness    Adrian [name] is a 61 y.o. male with a hx of hyperlipidemia, hypertension, DM2, GERD, asthma, OSA on CPAP, PE (2018), hepatic steatosis, hypogonadism last seen 07/28/21.  Echo 07/2017 LVEF 55 to 60%, RV SF normal, PASP not calculated as no tricuspid regurgitation.  Prior echo 01/2017 PASP 61 mmHg.  Prior intolerance to statin but does well with Repatha.  He had echo 05/2021 revealing normal LVEF 55 to 60%, LV mildly dilated, RV SF normal, tricuspid regurgitation signal inadequate for assessing PA pressure, mild dilation aortic root 40 mm.  Coronary CTA 05/2022 coronary calcium score of 163 placing him in the 70th percentile for age and sex matched control.  This revealed minimal nonobstructive coronary disease.  Last seen 07/2021 doing overall well from cardiac perspective. He was referred to PREP exercise program at [facility]. BP was mildly elevated in clinic but reported well controlled at home.  He presents today for follow up. Upcoming trip to [place] to celebrate his 61th birthday. 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 BP at home 132-141/80-90 when checked with arm cuff. Reports no shortness of breath nor dyspnea on exertion. Reports no chest pain, pressure, or tightness. No  edema, orthopnea, PND. Reports no palpitations.     EKGs/Labs/Other Studies Reviewed:   The following studies were reviewed today:   Coronary CTA/Calcium Score 05/30/2021: IMPRESSION: 1. Coronary calcium score of 163. This was 14 percentile for age and sex matched control.   2. Normal coronary origin with right dominance.   3. CAD-RADS 1. Minimal non-obstructive CAD (0-24%). Consider non-atherosclerotic causes of chest pain. Consider preventive therapy and risk factor modification.   Echo 05/25/2021: Sonographer Comments: Technically difficult study due to poor echo windows and patient is morbidly obese. Image acquisition challenging due to patient body habitus.   IMPRESSIONS    1. Very difficult study due to obesity. Overall, EF appears preserved  ~55-60% but difficult to assess for WMA despite contrast. LV appears  mildly dilated in PLAX but very difficult windows. LV appears normal in  other views.   2. Left ventricular ejection fraction, by estimation, is 60 to 65%. The  left ventricle has normal function. Left ventricular endocardial border  not optimally defined to evaluate regional wall motion. The left  ventricular internal cavity size was mildly  dilated. Left ventricular diastolic parameters were normal.   3. Right ventricular systolic function is normal. The right ventricular  size is mildly enlarged. Tricuspid regurgitation signal is inadequate for  assessing PA pressure.   4. The mitral valve is grossly normal. No evidence of mitral valve  regurgitation. No evidence of mitral stenosis.   5. The aortic valve is tricuspid. Aortic valve regurgitation is not  visualized. No aortic stenosis is present.   6. Aortic dilatation noted. There is mild dilatation of the aortic root,  measuring 40 mm.   7. The inferior vena cava is normal in size with greater than 50%  respiratory variability, suggesting right atrial pressure of 3 mmHg.   Echo 07/24/2017: Study Conclusions   - Left ventricle: The cavity size was normal. Systolic function was    normal. The estimated ejection fraction was in the range of 55%    to 60%. Wall motion was normal; there were no regional wall    motion abnormalities.  - Aortic valve: Transvalvular velocity was within the normal range.    There was no stenosis. There was no regurgitation.  - Mitral valve: Transvalvular velocity was within the normal range.    There was no evidence for stenosis. There was trivial    regurgitation.  - Left atrium: The atrium was severely dilated.  - Right ventricle: The cavity size was normal. Wall thickness was    normal. Systolic function was normal.  - Tricuspid valve: There was  no regurgitation.    [place] LE Venous DVT 07/24/2017: Final Interpretation  Right: There is no evidence of deep vein thrombosis in the lower  extremity. No cystic structure found in the popliteal fossa.  Left: There is no evidence of deep vein thrombosis in the lower extremity.  No cystic structure found in the popliteal fossa.    CTA Chest 01/31/2017: IMPRESSION: 1. Acute bilateral pulmonary emboli, occlusive in RIGHT middle and RIGHT upper lobar artery's. CT evidence of right heart strain (RV/LV Ratio = 1.1) consistent with at least submassive (intermediate risk) PE. The presence of right heart strain has been associated with an increased risk of morbidity and mortality. Please activate Code PE by paging [phone]. 2. Atelectasis without pulmonary infarct. 3. Critical Value/emergent results were called by telephone at the time of interpretation on 01/31/2017 at 10:19 pm to Dr. Brantley Stage , who verbally acknowledged these results.  Aortic Atherosclerosis (ICD10-I70.0).  EKG:  No EKG today.  Recent Labs: 05/17/2021: ALT 44; BUN 16; Creatinine, Ser 0.94; Potassium 4.1; Sodium 137 06/08/2021: Hemoglobin 15.7; Platelets 233.0; TSH 3.54  Recent Lipid Panel    Component Value Date/Time   CHOL 148 05/17/2021 0919   TRIG 149.0 05/17/2021 0919   HDL 37.50 (L) 05/17/2021 0919   CHOLHDL 4 05/17/2021 0919   VLDL 29.8 05/17/2021 0919   LDLCALC 81 05/17/2021 0919   LDLCALC 131 (H) 07/06/2020 1436   LDLDIRECT 133.0 02/17/2021 0835    Home Medications   Current Meds  Medication Sig   budesonide (PULMICORT) 180 MCG/ACT inhaler Inhale 2 puffs into the lungs in the morning and at bedtime.   cetirizine (ZYRTEC) 10 MG tablet Take 1 tablet (10 mg total) by mouth daily.   cyclobenzaprine (FLEXERIL) 10 MG tablet TAKE 1 TABLET BY MOUTH EVERYDAY AT BEDTIME PRN   dapagliflozin propanediol (FARXIGA) 10 MG TABS tablet Take 1 tablet (10 mg total) by mouth daily before breakfast.   Evolocumab (REPATHA)  140 MG/ML SOSY Inject 1 mL into the skin every 14 (fourteen) days.   ezetimibe (ZETIA) 10 MG tablet Take 1 tablet (10 mg total) by mouth daily.   fluticasone (FLONASE) 50 MCG/ACT nasal spray Place into both nostrils daily.   gabapentin (NEURONTIN) 300 MG capsule TAKE 1 CAPSULE BY MOUTH EVERYDAY AT BEDTIME   hydrochlorothiazide (HYDRODIURIL) 25 MG tablet Take 1 tablet (25 mg total) by mouth daily.   losartan (COZAAR) 100 MG tablet Take 1 tablet (100 mg total) by mouth daily.   metFORMIN (GLUCOPHAGE) 1000 MG tablet Take 1 tablet (1,000 mg total) by mouth 2 (two) times daily with a meal.   montelukast (SINGULAIR) 10 MG tablet Take 1 tablet (10 mg total) by mouth at bedtime.   rivaroxaban (XARELTO) 10 MG TABS tablet Take 1 tablet (10 mg total) by mouth daily.   UNABLE TO FIND Med Name: CPAP   [DISCONTINUED] amLODipine (NORVASC) 5 MG tablet Take 1 tablet (5 mg total) by mouth daily.     Review of Systems      All other systems reviewed and are otherwise negative except as noted above.  Physical Exam    VS:  BP 138/86 (BP Location: Left Arm, Patient Position: Sitting, Cuff Size: Large)    Pulse 72    Ht 6\' 1"  (1.854 m)    Wt (!) 353 lb (160.1 kg)    BMI 46.57 kg/m  , BMI Body mass index is 46.57 kg/m.  Wt Readings from Last 3 Encounters:  09/25/21 (!) 353 lb (160.1 kg)  07/28/21 (!) 358 lb 11.2 oz (162.7 kg)  06/08/21 (!) 356 lb (161.5 kg)     GEN: Well nourished, well developed, in no acute distress. HEENT: normal. Neck: Supple, no JVD, carotid bruits, or masses. Cardiac: RRR, no murmurs, rubs, or gallops. No clubbing, cyanosis, edema.  Radials/PT 2+ and equal bilaterally.  Respiratory:  Respirations regular and unlabored, clear to auscultation bilaterally. GI: Soft, nontender, nondistended. MS: No deformity or atrophy. Skin: Warm and dry, no rash. Neuro:  Strength and sensation are intact. Psych: Normal affect.  Assessment & Plan    Hypertension - BP not at goal. Increase  Amlodipine from 5mg  to 10mg . Continue Losartan 100mg  QD, HCTZ 25mg  QD. Discussed possible transition to TriBenzor in the future, prefers to remain on generic agents. MyChart message in 3-4 weeks to check in. If BP not at goal consider transition to more potent ARB.   Morbid obesity -  Weight loss via diet and exercise encouraged. Discussed the impact being overweight would have on cardiovascular risk. Referred to [facility]P [facility] program at [facility]. Per his report will have more time now.   OSA - CPAP compliance encouraged. Endorses wearing nightly.   CAD -minimal plaque on coronary CTA 05/2021.Stable with no anginal symptoms. No indication for ischemic evaluation. GDMT includes Farxiga, Repatha, Zetia. No Aspirin due to anticoagulation.   Disposition: Follow up in 6 month(s) with Skeet Latch, MD or APP.  Signed, Loel Dubonnet, NP 09/25/2021, 12:03 PM [place]

## 2021-09-25 ENCOUNTER — Encounter (HOSPITAL_BASED_OUTPATIENT_CLINIC_OR_DEPARTMENT_OTHER): Payer: Self-pay | Admitting: Family

## 2021-09-25 ENCOUNTER — Ambulatory Visit (HOSPITAL_BASED_OUTPATIENT_CLINIC_OR_DEPARTMENT_OTHER): Payer: Federal, State, Local not specified - PPO | Admitting: Family

## 2021-09-25 ENCOUNTER — Other Ambulatory Visit: Payer: Self-pay

## 2021-09-25 VITALS — BP 138/86 | HR 72 | Ht 73.0 in | Wt 353.0 lb

## 2021-09-25 DIAGNOSIS — I10 Essential (primary) hypertension: Secondary | ICD-10-CM

## 2021-09-25 DIAGNOSIS — G4733 Obstructive sleep apnea (adult) (pediatric): Secondary | ICD-10-CM

## 2021-09-25 DIAGNOSIS — I25118 Atherosclerotic heart disease of native coronary artery with other forms of angina pectoris: Secondary | ICD-10-CM | POA: Diagnosis not present

## 2021-09-25 DIAGNOSIS — Z6841 Body Mass Index (BMI) 40.0 and over, adult: Secondary | ICD-10-CM

## 2021-09-25 DIAGNOSIS — E785 Hyperlipidemia, unspecified: Secondary | ICD-10-CM

## 2021-09-25 MED ORDER — AMLODIPINE BESYLATE 10 MG PO TABS: 10.0000 mg | ORAL_TABLET | Freq: Every day | ORAL | 3 refills | Status: DC

## 2021-09-25 NOTE — Patient Instructions (Addendum)
Medication Instructions:  ?Your physician has recommended you make the following change in your medication:  ? ?CHANGE your Amlodipine to '10mg'$  daily  ?  ?*If you need a refill on your cardiac medications before your next appointment, please call your pharmacy* ? ?Lab Work: ?None ordered today.  ? ?Testing/Procedures: ?None ordered today.  ? ?Follow-Up: ?At [facility], you and your health needs are our priority.  As part of our continuing mission to provide you with exceptional heart care, we have created designated Provider Care Teams.  These Care Teams include your primary Cardiologist (physician) and Advanced Practice Providers (APPs -  Physician Assistants and Nurse Practitioners) who all work together to provide you with the care you need, when you need it. ? ?We recommend signing up for the patient portal called "MyChart".  Sign up information is provided on this After Visit Summary.  MyChart is used to connect with patients for Virtual Visits (Telemedicine).  Patients are able to view lab/test results, encounter notes, upcoming appointments, etc.  Non-urgent messages can be sent to your provider as well.   ?To learn more about what you can do with MyChart, go to [email or web address].   ? ?Your next appointment:   ?6 month(s) ? ?The format for your next appointment:   ?In Person ? ?Provider:   ?Skeet Latch, MD  ? ? ?Other Instructions ? ?Heart Healthy Diet Recommendations: ?A low-salt diet is recommended. Meats should be grilled, baked, or boiled. Avoid fried foods. Focus on lean protein sources like fish or chicken with vegetables and fruits. The American Heart Association is a Microbiologist!  American Heart Association Diet and Lifeystyle Recommendations   ? ?Exercise recommendations: ?The American Heart Association recommends 150 minutes of moderate intensity exercise weekly. ?Try 30 minutes of moderate intensity exercise 4-5 times per week. ?This could include walking, jogging, or  swimming. ? ?Tips to Measure your Blood Pressure Correctly ? ?Here's what you can do to ensure a correct reading: ? Don't drink a caffeinated beverage or smoke during the 30 minutes before the test. ? Sit quietly for five minutes before the test begins. ? During the measurement, sit in a chair with your feet on the floor and your arm supported so your elbow is at about heart level. ? The inflatable part of the cuff should completely cover at least 80% of your upper arm, and the cuff should be placed on bare skin, not over a shirt. ? Don't talk during the measurement. ? Have your blood pressure measured twice, with a brief break in between. If the readings are different by 5 points or more, have it done a third time. ? ?In 2017, new guidelines from the [place], the [employer] of Cardiology, and nine other health organizations lowered the diagnosis of high blood pressure to 130/80 mm Hg or higher for all adults. The guidelines also redefined the various blood pressure categories to now include normal, elevated, Stage 1 hypertension, Stage 2 hypertension, and hypertensive crisis (see "Blood pressure categories"). ? ?Blood pressure categories  ?Blood pressure category SYSTOLIC ?(upper number)  DIASTOLIC ?(lower number)  ?Normal Less than 120 mm Hg and Less than 80 mm Hg  ?Elevated 120-129 mm Hg and Less than 80 mm Hg  ?High blood pressure: Stage 1 hypertension 130-139 mm Hg or 80-89 mm Hg  ?High blood pressure: Stage 2 hypertension 140 mm Hg or higher or 90 mm Hg or higher  ?Hypertensive crisis (consult your doctor immediately) Higher than 180 mm Hg and/or  Higher than 120 mm Hg  ?Source: American Heart Association and American Stroke Association. ?For more on getting your blood pressure under control, buy Controlling Your Blood Pressure, a Special Health Report from [facility]. ? ? ?Blood Pressure Log ? ? ?Date ?  ?Time  ?Blood Pressure  ?Example: Nov 1 9 AM 124/78  ? ?    ? ?    ? ?     ? ?    ? ?    ? ?    ? ?    ? ?    ? ? ?   ?

## 2021-09-27 DIAGNOSIS — G4733 Obstructive sleep apnea (adult) (pediatric): Secondary | ICD-10-CM | POA: Diagnosis not present

## 2021-09-28 ENCOUNTER — Telehealth: Payer: Self-pay

## 2021-09-28 NOTE — Telephone Encounter (Signed)
Called to discuss [place] PREP schedule at [facility] for April/May left voicemail ?

## 2021-10-10 ENCOUNTER — Telehealth: Payer: Self-pay | Admitting: Gastroenterology

## 2021-10-10 NOTE — Telephone Encounter (Signed)
Hi Dr. Bryan Lemma, ? ?This patient had his last endo/colon with Dr. Cristina Gong 11/19/14.  The records are in [place].  However, he saw you for an OV 08/04/18.  I did not see a transfer of care request, so I was unsure if you've accepted him as your patient.  He is calling saying it is time for another colonoscopy and would like to set something up with you.  Please let me know how I should proceed.   ? ?Thank you. ?

## 2021-10-10 NOTE — Telephone Encounter (Signed)
Ok to schedule direct access colonoscopy with me for ongoing polyp surveillance. ?

## 2021-10-12 DIAGNOSIS — G4733 Obstructive sleep apnea (adult) (pediatric): Secondary | ICD-10-CM | POA: Diagnosis not present

## 2021-10-16 ENCOUNTER — Encounter (HOSPITAL_BASED_OUTPATIENT_CLINIC_OR_DEPARTMENT_OTHER): Payer: Self-pay

## 2021-10-19 ENCOUNTER — Encounter: Payer: Self-pay | Admitting: Gastroenterology

## 2021-10-19 ENCOUNTER — Telehealth: Payer: Self-pay | Admitting: General Surgery

## 2021-10-19 ENCOUNTER — Ambulatory Visit: Payer: Federal, State, Local not specified - PPO | Admitting: Gastroenterology

## 2021-10-19 VITALS — BP 138/88 | HR 63 | Ht 73.0 in | Wt 354.5 lb

## 2021-10-19 DIAGNOSIS — G4733 Obstructive sleep apnea (adult) (pediatric): Secondary | ICD-10-CM

## 2021-10-19 DIAGNOSIS — Z86711 Personal history of pulmonary embolism: Secondary | ICD-10-CM

## 2021-10-19 DIAGNOSIS — E1169 Type 2 diabetes mellitus with other specified complication: Secondary | ICD-10-CM | POA: Diagnosis not present

## 2021-10-19 DIAGNOSIS — Z7901 Long term (current) use of anticoagulants: Secondary | ICD-10-CM

## 2021-10-19 DIAGNOSIS — I2510 Atherosclerotic heart disease of native coronary artery without angina pectoris: Secondary | ICD-10-CM

## 2021-10-19 DIAGNOSIS — Z79899 Other long term (current) drug therapy: Secondary | ICD-10-CM

## 2021-10-19 DIAGNOSIS — Z6841 Body Mass Index (BMI) 40.0 and over, adult: Secondary | ICD-10-CM

## 2021-10-19 DIAGNOSIS — Z9989 Dependence on other enabling machines and devices: Secondary | ICD-10-CM

## 2021-10-19 DIAGNOSIS — E782 Mixed hyperlipidemia: Secondary | ICD-10-CM

## 2021-10-19 DIAGNOSIS — Z86010 Personal history of colonic polyps: Secondary | ICD-10-CM | POA: Diagnosis not present

## 2021-10-19 NOTE — Telephone Encounter (Signed)
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 Recommend clearance come from managing provider since anticoag is not being used for cardiac indication. ? ?

## 2021-10-19 NOTE — Telephone Encounter (Signed)
[facility] Pre-operative Risk Assessment  ?   ?Request for surgical clearance:     Endoscopy Procedure ? ?What type of surgery is being performed?     Colonoscopy at [facility] ? ?When is this surgery scheduled?     12/04/2021 ? ?What type of clearance is required ?   Pharmacy ? ?Are there any medications that need to be held prior to surgery and how long? Xarelto for 2 days ? ?Practice name and name of physician performing surgery?      [facility] ? ?What is your office phone and fax number?      Phone- [phone]  Fax- [phone] ? ?Anesthesia type (None, local, MAC, general) ?       MAC ? ?

## 2021-10-19 NOTE — Telephone Encounter (Signed)
Spoke with the patient regarding his procedure for colonoscopy at [facility] on 12/04/2021. Expressed to him that he would be receiving a call from [facility] regarding his pre-op. I have placed his prep instructions in the mail and they have also been sent via mychart. The patient was encouraged to call with any questions. I also advised him we would contact him in regards to holding his xarelto for the procedure. The patient verbalized understanding. ?

## 2021-10-19 NOTE — Patient Instructions (Addendum)
If you are age 60 or older, your body mass index should be between 23-30. Your Body mass index is 46.77 kg/m?Marland Kitchen If this is out of the aforementioned range listed, please consider follow up with your Primary Care Provider. ? ?If you are age 27 or younger, your body mass index should be between 19-25. Your Body mass index is 46.77 kg/m?Marland Kitchen If this is out of the aformentioned range listed, please consider follow up with your Primary Care Provider.  ? ?Due to recent changes in healthcare laws, you may see the results of your imaging and laboratory studies on MyChart before your provider has had a chance to review them.  We understand that in some cases there may be results that are confusing or concerning to you. Not all laboratory results come back in the same time frame and the provider may be waiting for multiple results in order to interpret others.  Please give [place] 48 hours in order for your provider to thoroughly review all the results before contacting the office for clarification of your results.  ? ? ?Please purchase the following medications over the counter and take as directed: ? ?Miralax and Ducolax for your colonoscopy. ? ?We want to thank you for trusting [facility] with your care. All of our staff and providers value the relationships we have built with our patients, and it is an honor to care for you.  ? ?We are writing to let you know that [facility] will close on Dec 04, 2021, and we invite you to continue to see Dr. Carmell [name] and Adrian [name] at the [facility] [place] office location. We are consolidating our serices at these [facility] practices to better provide care. Our office staff will work with you to ensure a seamless transition.  ? ?Adrian Heck, DO -Dr. Bryan [name] will be movig to [facility] at [address], [place], [place] [address], effective Dec 04, 2021.  Contact [phone] to schedule an appointment with  him.  ? ?Adrian Austria, MD- Dr. Lyndel [name] will be movig to [facility] at [address], [place], [place] [address], effective Dec 04, 2021.  Contact [phone] to schedule an appointment with him.  ? ?Requesting Medical Records ?If you need to request your medical records, please follow the instructions below. Your medical records are confidential, and a copy can be transferred to another provider or released to you or another person you designate only with your permission. ? ?There are several ways to request your medical records: ?Requests for medical records can be submitted through our practice.   ?You can also request your records electronically, in your MyChart account by selecting the ?Request Health Records? tab.  ?If you need additional information on how to request records, please go to [email or web address]/, choose Patient Information, then select Request Medical Records. ?To make an appointment or if you have any questions about your health care needs, please contact our office at [phone] and one of our staff members will be glad to assist you. ?[facility] is committed to providing exceptional care for you and our community. Thank you for allowing [place] to serve your health care needs. ?Sincerely, ? ?Adrian [name], Director [facility] ?[facility] also offers convenient virtual care options. Sore throat? Sinus problems? Cold or flu symptoms? Get care from the comfort of home with [facility] Video Visits and e-Visits. Learn more about the non-emergency conditions treated and start your virtual visit at [email or web address]  ? ?You  will be contaced by our office prior to your procedure for directions on holding your Xarelto.  If you do not hear from our office 1 week prior to your scheduled procedure, please call [phone] to discuss. ? ?We have scheduled your colonoscopy on 12/04/2021 at [facility]. Please follow the instructions we are sending to your  mychart for the preparation.  ? ?Thank you for choosing me and [facility]. ? ?Adrian [name], D.O.  ? ? ? ?

## 2021-10-19 NOTE — Telephone Encounter (Signed)
[facility] Pre-operative Risk Assessment  ?   ?Request for surgical clearance:     Endoscopy Procedure ? ?What type of surgery is being performed?     Colonoscopy at [facility] ? ?When is this surgery scheduled?     12/03/2021 ? ?What type of clearance is required ?   Pharmacy ? ?Are there any medications that need to be held prior to surgery and how long? Xarelto for 2 days ? ?Practice name and name of physician performing surgery?      [facility] ? ?What is your office phone and fax number?      Phone- [phone]  Fax- [phone] ? ?Anesthesia type (None, local, MAC, general) ?       MAC  ?

## 2021-10-19 NOTE — Telephone Encounter (Signed)
? ?  Primary Cardiologist: Skeet Latch, MD ? ?Chart reviewed as part of pre-operative protocol coverage. Given past medical history and time since last visit, based on ACC/AHA guidelines, Adrian [name] would be at acceptable risk for the planned procedure without further cardiovascular testing.  He was seen in follow-up by cardiology on 09/25/2021.  He remained stable from a cardiac standpoint at that time.  Blood pressure was well controlled. ? ?His Xarelto was not prescribed by cardiology provider.  Please contact PCP for recommendations on holding Xarelto.  Thank you. ? ?I will route this recommendation to the requesting party via Epic fax function and remove from pre-op pool. ? ?Please call with questions. ? ?Jossie Ng. Tiyanna Larcom NP-C ? ?  ?10/19/2021, 4:31 PM ?[place] ?[address] ?Office [phone] Fax [phone] ? ? ? ? ?

## 2021-10-19 NOTE — Progress Notes (Signed)
? ?Chief Complaint:    Colon cancer screening, discuss colonoscopy, medication management ? ?GI History: 61 year old Adrian [name] with a history of OSA (on CPAP with supplemental O2), PE (provoked by long car ride; on Xarelto), obesity (BMI 46.7), HTN, HLD, hepatic steatosis, GERD, diverticulosis, diabetes, asthma, CAD, subclinical hypothyroidism. ? ? ?Endoscopic history: ?- Colonoscopy (10/2014, Dr. Cristina Gong): 2 Tubular Adenomas in ascending colon, 2 benign transverse colon polyps, sigmoid diverticulosis.  Repeat in 5 years. ? ?HPI:   ? ? ?Patient is a 61 y.o. Adrian [name] presenting to [facility] to discuss repeat colonoscopy for ongoing polyp surveillance, along with management of anticoagulation in the perioperative period.  Last colonoscopy was 2016 and notable for 2 tubular adenomas, with repeat recommended in 5 years. ? ?He is otherwise without any GI symptoms.  No hematochezia, melena, change in bowel habits.  Good p.o. intake without n/v. ? ?Does not a history of OSA and wears CPAP at night with 2L supplemental O2 running through a concentrator.  He is prescribed supplemental O2 tank which he keeps on reserve for emergency. ? ?Recent CT coronary with minimal nonobstructive CAD and calcium score 163. ? ? ?  Latest Ref Rng & Units 06/08/2021  ? 10:29 AM 12/08/2020  ? 10:26 AM 07/06/2020  ?  2:36 PM  ?CBC  ?WBC 4.0 - 10.5 K/uL 7.9   7.5   7.5    ?Hemoglobin 13.0 - 17.0 g/dL 15.7   15.7   15.6    ?Hematocrit 39.0 - 52.0 % 47.6   Adrian Adrian [name].8   Adrian Adrian [name].2    ?Platelets 150.0 - 400.0 K/uL 233.0   257.0   264    ? ? ?  Latest Ref Rng & Units 05/17/2021  ?  9:19 AM 12/08/2020  ? 10:26 AM 07/06/2020  ?  2:36 PM  ?CMP  ?Glucose 70 - 99 mg/dL 161   141   192    ?BUN 6 - 23 mg/dL '16   13   14    '$ ?Creatinine 0.40 - 1.50 mg/dL 0.94   1.00   0.94    ?Sodium 135 - 145 mEq/L 137   138   138    ?Potassium 3.5 - 5.1 mEq/L 4.1   4.3   4.2    ?Chloride 96 - 112 mEq/L 103   100   102    ?CO2 19 - 32 mEq/L '27   29   25    '$ ?Calcium 8.4 - 10.5  mg/dL 10.1   11.1   9.8    ?Total Protein 6.0 - 8.3 g/dL 7.2    7.1    ?Total Bilirubin 0.2 - 1.2 mg/dL 0.4    0.4    ?Alkaline Phos 39 - 117 U/L 66      ?AST 0 - 37 U/L 33    47    ?ALT 0 - 53 U/L 44    69    ? ? ? ?Review of systems:     No chest pain, no SOB, no fevers, no urinary sx  ? ?Past Medical History:  ?Diagnosis Date  ? Adenomatous colon polyp   ? FS 02/2004; colo 2005 hyperplastic polyp; 2010 neg - butmucosal prolapse.   ? Allergy   ? Asthma   ? Atypical chest pain 05/19/2021  ? Blood in stool   ? CAD in native artery 07/28/2021  ? Chicken pox   ? Colon polyps   ? Diabetes mellitus without complication ([place]) 92/0100  ? Diverticulosis   ? Frequent  headaches   ? Genital warts   ? GERD (gastroesophageal reflux disease)   ? Hemorrhoid   ? Hepatic steatosis 04/2019  ? Elevated liver enzymes-full work-up for infectious disease process with ultrasound normal with the exception of hepatic steatosis  ? Hyperlipidemia   ? Hypertension   ? Hypogonadism in Adrian [name]   ? labs x2: 11/14/2015 - 216 and 11/17/2015 193; declined med at that time.   ? Insomnia   ? OSA on CPAP 09/18/2016  ? HST 09/18/2016; ESS-4; AHI-76; O2 min- 61%  ? Pulmonary embolism ([place]) 01/2017  ? provoked by long care ride, on xarelto, est Dr. Melvyn Novas  ? Thyroid disease   ? subclinical hypothyroidism per records  ? ? ?Patient's surgical history, family medical history, social history, medications and allergies were all reviewed in Epic  ? ? ?Current Outpatient Medications  ?Medication Sig Dispense Refill  ? amLODipine (NORVASC) 10 MG tablet Take 1 tablet (10 mg total) by mouth daily. 90 tablet 3  ? budesonide (PULMICORT) 180 MCG/ACT inhaler Inhale 2 puffs into the lungs in the morning and at bedtime. 1 each 11  ? cetirizine (ZYRTEC) 10 MG tablet Take 1 tablet (10 mg total) by mouth daily. 30 tablet 11  ? cyclobenzaprine (FLEXERIL) 10 MG tablet TAKE 1 TABLET BY MOUTH EVERYDAY AT BEDTIME PRN 30 tablet 5  ? dapagliflozin propanediol (FARXIGA) 10 MG TABS tablet  Take 1 tablet (10 mg total) by mouth daily before breakfast. 90 tablet 1  ? Evolocumab (REPATHA) 140 MG/ML SOSY Inject 1 mL into the skin every 14 (fourteen) days. 6 mL 3  ? ezetimibe (ZETIA) 10 MG tablet Take 1 tablet (10 mg total) by mouth daily. 90 tablet 3  ? fluticasone (FLONASE) 50 MCG/ACT nasal spray Place into both nostrils daily.    ? gabapentin (NEURONTIN) 300 MG capsule TAKE 1 CAPSULE BY MOUTH EVERYDAY AT BEDTIME 90 capsule 2  ? hydrochlorothiazide (HYDRODIURIL) 25 MG tablet Take 1 tablet (25 mg total) by mouth daily. 90 tablet 1  ? losartan (COZAAR) 100 MG tablet Take 1 tablet (100 mg total) by mouth daily. 90 tablet 1  ? metFORMIN (GLUCOPHAGE) 1000 MG tablet Take 1 tablet (1,000 mg total) by mouth 2 (two) times daily with a meal. 180 tablet 1  ? montelukast (SINGULAIR) 10 MG tablet Take 1 tablet (10 mg total) by mouth at bedtime. 90 tablet 3  ? rivaroxaban (XARELTO) 10 MG TABS tablet Take 1 tablet (10 mg total) by mouth daily. 90 tablet 1  ? UNABLE TO FIND Med Name: CPAP    ? ?No current facility-administered medications for this visit.  ? ? ?Physical Exam:   ? ? ?BP 138/88   Pulse 63   Ht '6\' 1"'$  (1.854 m)   Wt (!) 354 lb 8 oz (160.8 kg)   SpO2 97%   BMI 46.77 kg/m?  ? ?GENERAL:  Pleasant Adrian [name] in NAD ?PSYCH: : Cooperative, normal affect ?NEURO: Alert and oriented x 3, no focal neurologic deficits ? ? ?IMPRESSION and PLAN:   ? ?1) History of colon polyps ?- Due for repeat colonoscopy for ongoing polyp surveillance ?- Schedule colonoscopy at [facility] due to elevated periprocedural risks from underlying comorbidities ? ?2) History of PE ?3) Chronic anticoagulation ?- Hold Xarelto 2 days before procedure - will instruct when and how to resume after procedure. Low but real risk of cardiovascular event such as heart attack, stroke, embolism, thrombosis or ischemia/infarct of other organs off Xarelto explained and need to seek urgent help  if this occurs. The patient consents to proceed. Will communicate  by phone or EMR with patient's prescribing provider to confirm that holding Xarelto is reasonable in this case ? ?4) OSA on CPAP with supplemental O2 at night ?5) Hypertension ?6) Obesity (BMI 46.7) ?7) CAD ?8) Diabetes ?- Procedures to be scheduled at [facility] due to elevated periprocedural risks from underlying comorbidities ? ?The indications, risks, and benefits of colonoscopy were explained to the patient in detail. Risks include but are not limited to bleeding, perforation, adverse reaction to medications, and cardiopulmonary compromise. Sequelae include but are not limited to the possibility of surgery, hospitalization, and mortality. The patient verbalized understanding and wished to proceed. All questions answered, referred to the scheduler and bowel prep ordered. Further recommendations pending results of the exam.  ? ?    ? ?Lavena Bullion ,DO, FACG 10/19/2021, 11:12 AM ? ?

## 2021-10-20 NOTE — Telephone Encounter (Addendum)
I am not certain why this was sent to my inbasket?  ?I am also not certain if this is a statement or a question, and are they asking for a response- from me? ? ?Sender is heartcare to [facility] [facility]? ?

## 2021-10-20 NOTE — Telephone Encounter (Signed)
Please clarify questions below.  ?

## 2021-10-23 NOTE — Telephone Encounter (Signed)
PATIENT NAME/ Date of Birth: __[name]/ DOB: [birth year] ?PROCEDURE NAME: Colonoscopy with possible biopsy ?DATE OF PROCEDURE: __5/15/2023_ ? ? ?PATIENT IS LOW  RISK FOR THE PROCEDURE. ?OK TO HOLD ANTICOAGULATION/ANTIPLATELETS FOR ___2_ DAYS  ? ? ?No patient is free of risk when undergoing a procedure. The decision about whether to proceed with the operation belongs to the surgeon and the patient.  Without daily anticoagulation patient is at some risk for recurrent DVT/PE.  However, risk should be low, if able to restart within 24 hours after procedure. ? ?

## 2021-10-23 NOTE — Telephone Encounter (Signed)
The sender is from [place].  ? ?This patient has a procedure at the hospital on 12-04-2021 with Dr Bryan Lemma for a colonoscopy and we are asking approval for this patient to hold his Xarelto 2 days prior to his procedure. You are his primary care doctor and as policy we have to reach out the PCP first when a patient's blood thinner is managed/prescribed by the PCP. Please respond and send it back to Lanny Hurst with the answer. Thank you ?

## 2021-10-26 NOTE — Telephone Encounter (Signed)
Received cardiac clearance from Dr. Raoul Pitch. Patient made aware to hold Xarelto 2 days prior to his procedure. Patient voiced understanding.  ?

## 2021-10-28 DIAGNOSIS — G4733 Obstructive sleep apnea (adult) (pediatric): Secondary | ICD-10-CM | POA: Diagnosis not present

## 2021-11-02 ENCOUNTER — Other Ambulatory Visit: Payer: Self-pay

## 2021-11-02 ENCOUNTER — Telehealth: Payer: Self-pay | Admitting: Gastroenterology

## 2021-11-02 DIAGNOSIS — Z86010 Personal history of colonic polyps: Secondary | ICD-10-CM

## 2021-11-02 NOTE — Telephone Encounter (Signed)
Procedure rescheduled to 01/03/22 at 8:30 am with Dr. Bryan Lemma at [facility]. Attempted to call pt to notify him of new date. Left voicemail for pt to call back. Ambulatory referral placed. Updated instructions sent to pt's mychart and mailed.  ?

## 2021-11-02 NOTE — Telephone Encounter (Signed)
Inbound call from patient stating that he needs to reschedule is procedure on 5/15 at [facility]. Please advise.  ?

## 2021-11-06 NOTE — Telephone Encounter (Signed)
Spoke with pt and let him know about procedure date and time. Pt states he saw instructions in mychart. Pt had no questions at end of call.  ?

## 2021-11-27 DIAGNOSIS — G4733 Obstructive sleep apnea (adult) (pediatric): Secondary | ICD-10-CM | POA: Diagnosis not present

## 2021-11-29 SURGERY — COLONOSCOPY WITH PROPOFOL
Anesthesia: Monitor Anesthesia Care

## 2021-12-05 ENCOUNTER — Other Ambulatory Visit: Payer: Self-pay | Admitting: Family Medicine

## 2021-12-13 ENCOUNTER — Encounter: Payer: Self-pay | Admitting: Family Medicine

## 2021-12-13 ENCOUNTER — Ambulatory Visit: Payer: Federal, State, Local not specified - PPO | Admitting: Family Medicine

## 2021-12-13 VITALS — BP 137/82 | HR 60 | Temp 97.9°F | Ht 73.0 in | Wt 352.0 lb

## 2021-12-13 DIAGNOSIS — G8929 Other chronic pain: Secondary | ICD-10-CM

## 2021-12-13 DIAGNOSIS — M546 Pain in thoracic spine: Secondary | ICD-10-CM

## 2021-12-13 DIAGNOSIS — I2510 Atherosclerotic heart disease of native coronary artery without angina pectoris: Secondary | ICD-10-CM

## 2021-12-13 DIAGNOSIS — E785 Hyperlipidemia, unspecified: Secondary | ICD-10-CM | POA: Diagnosis not present

## 2021-12-13 DIAGNOSIS — E1169 Type 2 diabetes mellitus with other specified complication: Secondary | ICD-10-CM

## 2021-12-13 DIAGNOSIS — I700 Atherosclerosis of aorta: Secondary | ICD-10-CM

## 2021-12-13 DIAGNOSIS — R109 Unspecified abdominal pain: Secondary | ICD-10-CM | POA: Diagnosis not present

## 2021-12-13 DIAGNOSIS — J4530 Mild persistent asthma, uncomplicated: Secondary | ICD-10-CM

## 2021-12-13 DIAGNOSIS — E782 Mixed hyperlipidemia: Secondary | ICD-10-CM

## 2021-12-13 DIAGNOSIS — Z7901 Long term (current) use of anticoagulants: Secondary | ICD-10-CM

## 2021-12-13 DIAGNOSIS — Z6841 Body Mass Index (BMI) 40.0 and over, adult: Secondary | ICD-10-CM

## 2021-12-13 DIAGNOSIS — I10 Essential (primary) hypertension: Secondary | ICD-10-CM

## 2021-12-13 DIAGNOSIS — R931 Abnormal findings on diagnostic imaging of heart and coronary circulation: Secondary | ICD-10-CM

## 2021-12-13 DIAGNOSIS — G4733 Obstructive sleep apnea (adult) (pediatric): Secondary | ICD-10-CM

## 2021-12-13 DIAGNOSIS — E559 Vitamin D deficiency, unspecified: Secondary | ICD-10-CM

## 2021-12-13 DIAGNOSIS — Z789 Other specified health status: Secondary | ICD-10-CM

## 2021-12-13 LAB — POCT GLYCOSYLATED HEMOGLOBIN (HGB A1C)
HbA1c POC (<> result, manual entry): 6.5 % (ref 4.0–5.6)
HbA1c, POC (controlled diabetic range): 6.5 % (ref 0.0–7.0)
HbA1c, POC (prediabetic range): 6.5 % — AB (ref 5.7–6.4)
Hemoglobin A1C: 6.5 % — AB (ref 4.0–5.6)

## 2021-12-13 LAB — MICROALBUMIN / CREATININE URINE RATIO
Creatinine,U: 46 mg/dL
Microalb Creat Ratio: 1.5 mg/g (ref 0.0–30.0)
Microalb, Ur: <0.7 mg/dL (ref 0.0–1.9)

## 2021-12-13 MED ORDER — METFORMIN HCL 1000 MG PO TABS: 1000.0000 mg | ORAL_TABLET | Freq: Two times a day (BID) | ORAL | 1 refills | Status: DC

## 2021-12-13 MED ORDER — EZETIMIBE 10 MG PO TABS: 10.0000 mg | ORAL_TABLET | Freq: Every day | ORAL | 3 refills | Status: DC

## 2021-12-13 MED ORDER — LOSARTAN POTASSIUM 100 MG PO TABS: 100.0000 mg | ORAL_TABLET | Freq: Every day | ORAL | 1 refills | Status: DC

## 2021-12-13 MED ORDER — BUDESONIDE 180 MCG/ACT IN AEPB: 2.0000 | INHALATION_SPRAY | Freq: Two times a day (BID) | RESPIRATORY_TRACT | 11 refills | Status: DC

## 2021-12-13 MED ORDER — MONTELUKAST SODIUM 10 MG PO TABS: 10.0000 mg | ORAL_TABLET | Freq: Every day | ORAL | 3 refills | Status: DC

## 2021-12-13 MED ORDER — GABAPENTIN 300 MG PO CAPS: ORAL_CAPSULE | ORAL | 2 refills | Status: DC

## 2021-12-13 MED ORDER — REPATHA 140 MG/ML ~~LOC~~ SOSY: 1.0000 mL | PREFILLED_SYRINGE | SUBCUTANEOUS | 3 refills | Status: DC

## 2021-12-13 MED ORDER — RIVAROXABAN 10 MG PO TABS: 10.0000 mg | ORAL_TABLET | Freq: Every day | ORAL | 1 refills | Status: DC

## 2021-12-13 MED ORDER — CETIRIZINE HCL 10 MG PO TABS: 10.0000 mg | ORAL_TABLET | Freq: Every day | ORAL | 11 refills | Status: DC

## 2021-12-13 MED ORDER — SPIRONOLACTONE 25 MG PO TABS: 25.0000 mg | ORAL_TABLET | Freq: Every day | ORAL | 1 refills | Status: DC

## 2021-12-13 MED ORDER — CYCLOBENZAPRINE HCL 10 MG PO TABS: ORAL_TABLET | ORAL | 5 refills | Status: DC

## 2021-12-13 MED ORDER — DAPAGLIFLOZIN PROPANEDIOL 10 MG PO TABS: 10.0000 mg | ORAL_TABLET | Freq: Every day | ORAL | 1 refills | Status: DC

## 2021-12-13 NOTE — Progress Notes (Unsigned)
Patient ID: Adrian [name], male  DOB: 1960/10/12, 61 y.o.   MRN: [id number] Patient Care Team    Relationship Specialty Notifications Start End  Ma Hillock, DO PCP - General Family Medicine  02/06/18   Skeet Latch, MD PCP - Cardiology Cardiology  09/22/21   Tanda Rockers, MD Consulting Physician Pulmonary Disease  02/07/18   Otelia Sergeant, OD Referring Physician   02/07/18   Lavena Bullion, DO Consulting Physician Gastroenterology  01/20/19     Chief Complaint  Patient presents with   Diabetes    [facility]; pt is not fasting    Subjective: Adrian [name] is a 61 y.o. male present for [facility]. All past medical history, surgical history, allergies, family history, immunizations, medications and social history were updated in the electronic medical record today. All recent labs, ED visits and hospitalizations within the last year were reviewed.  Essential hypertension/hyperlipidemia/morbid obesity/CKD3 Pt reports  compliance with losartan 100 mg QD, amlodipine 5 mg and HCTZ 25 mg daily. Patient denies chest pain, shortness of breath, dizziness or lower extremity edema.   Pt is not taking a daily baby ASA. Pt is not prescribed statin 2/2 intolerance x2. Compliance with zeita and repatha tolerating. Labs due next appointment Diet: low Salt Exercise: Routine exercise RF: Hypertension, hyperlipidemia, obesity, diabetes.    Type 2 diabetes mellitus without complication, without long-term current use of insulin ([place]) Patient reports compliance  with metformin 1000 mg daily daily and farxiga 10 mg qd. Patient denies dizziness, hyperglycemic or hypoglycemic events. Patient denies numbness, tingling in the extremities or nonhealing wounds of feet. He states he has cut back on his sugar consumption.  He is prescribed gabapentin  300 mg nightly and reports it is very helpful-mostly for back/leg pain. Diabetes diagnosed 2019  Asthma: Patient reports his asthma has been rather stable. Not  needing albuterol breakthrough. Compliant with Pulmicort and Singulair nightly. He is now taking a daily antihistamine.       06/08/2021    9:51 AM 05/17/2021    8:57 AM 07/06/2020    1:56 PM 04/07/2020    6:21 PM 06/04/2019    9:04 AM  Depression screen PHQ 2/9  Decreased Interest 0 0 0 0 0  Down, Depressed, Hopeless 0 0 0 0 0  PHQ - 2 Score 0 0 0 0 0       View : No data to display.               04/07/2020    6:21 PM 08/19/2018   12:00 PM 04/23/2018    6:06 PM 02/06/2018    8:43 AM  Fall Risk   Falls in the past year? 0 0 No No  Number falls in past yr: 0     Injury with Fall? 0     Follow up Falls evaluation completed       Immunization History  Administered Date(s) Administered   Influenza Split 05/03/2009, 05/19/2010, 04/27/2011, 05/02/2012, 05/18/2013   Influenza Whole 04/22/2017   Influenza,inj,Quad PF,6+ Mos 05/20/2015, 05/17/2016, 04/24/2017, 05/12/2018, 05/15/2019, 04/06/2020, 06/08/2021   Influenza,inj,quad, With Preservative 05/12/2014, 05/20/2015   Moderna Sars-Covid-2 Vaccination 11/23/2019, 12/28/2019, 07/30/2020   Pneumococcal Conjugate-13 05/12/2018   Pneumococcal Polysaccharide-23 02/02/2017   Td 07/24/2003   Tdap 11/13/2010, 06/04/2019   Zoster Recombinat (Shingrix) 06/04/2019, 10/27/2019   Past Medical History:  Diagnosis Date   Adenomatous colon polyp    FS 02/2004; colo 2005 hyperplastic polyp; 2010 neg - butmucosal prolapse.  Allergy    Asthma    Atypical chest pain 05/19/2021   Blood in stool    CAD in native artery 07/28/2021   Chicken pox    Colon polyps    Diabetes mellitus without complication ([place]) 03/2329   Diverticulosis    Frequent headaches    Genital warts    GERD (gastroesophageal reflux disease)    Hemorrhoid    Hepatic steatosis 04/2019   Elevated liver enzymes-full work-up for infectious disease process with ultrasound normal with the exception of hepatic steatosis   Hyperlipidemia    Hypertension    Hypogonadism in  male    labs x2: 11/14/2015 - 216 and 11/17/2015 193; declined med at that time.    Insomnia    OSA on CPAP 09/18/2016   HST 09/18/2016; ESS-4; AHI-76; O2 min- 61%   Pulmonary embolism ([place]) 01/2017   provoked by long care ride, on xarelto, est Dr. Melvyn Novas   Thyroid disease    subclinical hypothyroidism per records   Allergies  Allergen Reactions   Atorvastatin     Other reaction(s): myalgias   Crestor [Rosuvastatin]     Other reaction(s): severe foot pains   Lisinopril Cough    Other reaction(s): cough   Statins Other (See Comments)    Causes pain/ foot trouble   Past Surgical History:  Procedure Laterality Date   ANKLE SURGERY     COLONOSCOPY  2016   dr Cristina Gong    ESOPHAGOGASTRODUODENOSCOPY     with dr Cristina Gong    Family History  Problem Relation Age of Onset   Asthma Mother    Arthritis Mother    Asthma Sister    Asthma Brother    Allergies Brother    Alcohol abuse Maternal Grandmother    Cancer Maternal Grandmother    Hiatal hernia Maternal Grandfather    Colon cancer Neg Hx    Esophageal cancer Neg Hx    Social History   Social History Narrative   Marital status/children/pets: Married.   Education/employment: Associates degree, employed as a Armed forces operational officer:      -Wears a bicycle helmet riding a bike: No     -smoke alarm in the home:Yes     - wears seatbelt: Yes     - Feels safe in their relationships: Yes    Allergies as of 12/13/2021       Reactions   Atorvastatin    Other reaction(s): myalgias   Crestor [rosuvastatin]    Other reaction(s): severe foot pains   Lisinopril Cough   Other reaction(s): cough   Statins Other (See Comments)   Causes pain/ foot trouble        Medication List        Accurate as of Dec 13, 2021 11:59 PM. If you have any questions, ask your nurse or doctor.          amLODipine 10 MG tablet Commonly known as: NORVASC Take 1 tablet (10 mg total) by mouth daily.   budesonide 180 MCG/ACT inhaler Commonly  known as: PULMICORT Inhale 2 puffs into the lungs in the morning and at bedtime.   cetirizine 10 MG tablet Commonly known as: ZYRTEC Take 1 tablet (10 mg total) by mouth daily.   cyclobenzaprine 10 MG tablet Commonly known as: FLEXERIL TAKE 1 TABLET BY MOUTH EVERYDAY AT BEDTIME PRN   dapagliflozin propanediol 10 MG Tabs tablet Commonly known as: Farxiga Take 1 tablet (10 mg total) by mouth daily before breakfast. What changed: how much  to take Changed by: Howard Pouch, DO   ezetimibe 10 MG tablet Commonly known as: ZETIA Take 1 tablet (10 mg total) by mouth daily.   fluticasone 50 MCG/ACT nasal spray Commonly known as: FLONASE Place into both nostrils daily.   gabapentin 300 MG capsule Commonly known as: NEURONTIN TAKE 1 CAPSULE BY MOUTH EVERYDAY AT BEDTIME   hydrochlorothiazide 25 MG tablet Commonly known as: HYDRODIURIL TAKE 1 TABLET (25 MG TOTAL) BY MOUTH DAILY.   losartan 100 MG tablet Commonly known as: COZAAR Take 1 tablet (100 mg total) by mouth daily.   metFORMIN 1000 MG tablet Commonly known as: GLUCOPHAGE Take 1 tablet (1,000 mg total) by mouth 2 (two) times daily with a meal.   montelukast 10 MG tablet Commonly known as: SINGULAIR Take 1 tablet (10 mg total) by mouth at bedtime.   Repatha 140 MG/ML Sosy Generic drug: Evolocumab Inject 1 mL into the skin every 14 (fourteen) days.   rivaroxaban 10 MG Tabs tablet Commonly known as: Xarelto Take 1 tablet (10 mg total) by mouth daily.   spironolactone 25 MG tablet Commonly known as: ALDACTONE Take 1 tablet (25 mg total) by mouth daily. Started by: Howard Pouch, DO   UNABLE TO FIND Med Name: CPAP       All past medical history, surgical history, allergies, family history, immunizations andmedications were updated in the EMR today and reviewed under the history and medication portions of their EMR.      CT CORONARY MORPH W/CTA COR W/SCORE W/CA W/CM &/OR WO/CM Addendum Date: 05/30/2021    IMPRESSION: 1. Coronary calcium score of 163. This was 77 percentile for age and sex matched control. 2. Normal coronary origin with right dominance. 3. CAD-RADS 1. Minimal non-obstructive CAD (0-24%). Consider non-atherosclerotic causes of chest pain. Consider preventive therapy and risk factor modification. Berniece Salines, DO [facility] Electronically Signed   By: Berniece Salines D.O.   On: 05/30/2021 14:29   Result Date: 05/30/2021 IMPRESSION: 1.  Aortic Atherosclerosis (ICD10-I70.0). 2. Hepatic steatosis. Electronically Signed: By: Vinnie Langton M.D. On: 05/30/2021 13:40    ROS: 14 pt review of systems performed and negative (unless mentioned in an HPI)  Objective: BP 137/82   Pulse 60   Temp 97.9 F (36.6 C) (Oral)   Ht '6\' 1"'$  (1.854 m)   Wt (!) 352 lb (159.7 kg)   SpO2 95%   BMI 46.44 kg/m  Physical Exam Vitals and nursing note reviewed.  Constitutional:      General: He is not in acute distress.    Appearance: Normal appearance. He is obese. He is not ill-appearing, toxic-appearing or diaphoretic.  HENT:     Head: Normocephalic and atraumatic.     Mouth/Throat:     Mouth: Mucous membranes are moist.  Eyes:     General: No scleral icterus.       Right eye: No discharge.        Left eye: No discharge.     Extraocular Movements: Extraocular movements intact.     Pupils: Pupils are equal, round, and reactive to light.  Cardiovascular:     Rate and Rhythm: Normal rate and regular rhythm.     Heart sounds: No murmur heard. Pulmonary:     Effort: Pulmonary effort is normal. No respiratory distress.     Breath sounds: Normal breath sounds. No wheezing, rhonchi or rales.  Musculoskeletal:     Right lower leg: No edema.     Left lower leg: No edema.  Skin:  General: Skin is warm and dry.     Coloration: Skin is not jaundiced or pale.     Findings: No rash.  Neurological:     Mental Status: He is alert and oriented to person, place, and time. Mental status is at baseline.   Psychiatric:        Mood and Affect: Mood normal.        Behavior: Behavior normal.        Thought Content: Thought content normal.        Judgment: Judgment normal.   Diabetic Foot Exam - Simple   Simple Foot Form  12/13/2021  1:34 PM  Visual Inspection No deformities, no ulcerations, no other skin breakdown bilaterally: Yes Sensation Testing Intact to touch and monofilament testing bilaterally: Yes Pulse Check Posterior Tibialis and Dorsalis pulse intact bilaterally: Yes Comments      Assessment/plan: Adrian [name] is a 61 y.o. male present for [facility] Elevated liver enzymes/Hepatic steatosis -Acute hepatitis and autoimmune hepatitis, HIV>negative -Abdominal ultrasound > hepatic steatosis.  -L FTs have been stable .   Type 2 diabetes mellitus with hyperlipidemia goal of less than 7. -continue  metformin 1000 milligrams to twice daily dosing.  -continue  farxiga 10 mg qd Statin intolerant > continue zeita and repatha Continue diet and exercise modifications  PNA series: Pneumonia series completed Flu shot: Up-to-date (recommneded yearly) Microalbumin: collected today Foot exam: Completed 12/13/2021 Eye exam: 04/2020 Dr. Vivien Rota will make his appointment and have records sent to [place]. A1c: 6.6 >>5.8>>6.3>> 7.2>7.1> 8.9 >7.7> 6.8> 7.2> 6.5 A1c collected today   Mild persistent asthma without complication/Non-seasonal allergic rhinitis due to pollen stable Pulmicort 1-2 puffs BID.(fornualry changed from Asmanex) Continue Singulair 10 mg daily.  Continue Zyrtec 10 mg nightly   Chronic thoracic back pain: Stable Continue gabapentin nightly  Continue Flexeril 10 mg nightly as needed.     H/o PE- chronic anticoagulation- acquired thrombophilia(lifetime) Continue  xarelto   Essential hypertension/morbid obesity/HLD< 57 ldl goal/Aortic atherosclerosis/statin intolerant/morbid obesity-Body mass index is 46.44 kg/m./Agatston coronary artery calcium score between 100 and 199  (163- 78%) Above goal for diabetic. - Goal :130/80 with is DM history.  Continue losartan 100 mg daily. Discontinue HCTZ 25. START spironolactone 25 mg Continue   amlodipine 10 mg daily Continue Zetia 10 mg daily Continue Repatha injections every 2 weeks.  Lipids responded well. Low sodium diet Diet and exercise changes recommended  Subclincal hypothyroidism UTD  Return in about 6 months (around 06/11/2022) for cpe (40 min), Routine chronic condition follow-up.   Orders Placed This Encounter  Procedures   Microalbumin / creatinine urine ratio   POCT HgB A1C   Meds ordered this encounter  Medications   rivaroxaban (XARELTO) 10 MG TABS tablet    Sig: Take 1 tablet (10 mg total) by mouth daily.    Dispense:  90 tablet    Refill:  1   montelukast (SINGULAIR) 10 MG tablet    Sig: Take 1 tablet (10 mg total) by mouth at bedtime.    Dispense:  90 tablet    Refill:  3   metFORMIN (GLUCOPHAGE) 1000 MG tablet    Sig: Take 1 tablet (1,000 mg total) by mouth 2 (two) times daily with a meal.    Dispense:  180 tablet    Refill:  1   losartan (COZAAR) 100 MG tablet    Sig: Take 1 tablet (100 mg total) by mouth daily.    Dispense:  90 tablet    Refill:  1    Please dc all other losartan scripts and remind pt of changes and only take one of these now. Thank you so much!   gabapentin (NEURONTIN) 300 MG capsule    Sig: TAKE 1 CAPSULE BY MOUTH EVERYDAY AT BEDTIME    Dispense:  90 capsule    Refill:  2   dapagliflozin propanediol (FARXIGA) 10 MG TABS tablet    Sig: Take 1 tablet (10 mg total) by mouth daily before breakfast.    Dispense:  90 tablet    Refill:  1   ezetimibe (ZETIA) 10 MG tablet    Sig: Take 1 tablet (10 mg total) by mouth daily.    Dispense:  90 tablet    Refill:  3   Evolocumab (REPATHA) 140 MG/ML SOSY    Sig: Inject 1 mL into the skin every 14 (fourteen) days.    Dispense:  6 mL    Refill:  3   cyclobenzaprine (FLEXERIL) 10 MG tablet    Sig: TAKE 1 TABLET BY  MOUTH EVERYDAY AT BEDTIME PRN    Dispense:  30 tablet    Refill:  5   cetirizine (ZYRTEC) 10 MG tablet    Sig: Take 1 tablet (10 mg total) by mouth daily.    Dispense:  30 tablet    Refill:  11   budesonide (PULMICORT) 180 MCG/ACT inhaler    Sig: Inhale 2 puffs into the lungs in the morning and at bedtime.    Dispense:  1 each    Refill:  11   spironolactone (ALDACTONE) 25 MG tablet    Sig: Take 1 tablet (25 mg total) by mouth daily.    Dispense:  90 tablet    Refill:  1   Referral Orders  No referral(s) requested today    Note is dictated utilizing voice recognition software. Although note has been proof read prior to signing, occasional typographical errors still can be missed. If any questions arise, please do not hesitate to call for verification.  Electronically signed by: Howard Pouch, DO [place]

## 2021-12-13 NOTE — Patient Instructions (Addendum)
Return in about 6 months (around 06/11/2022) for cpe (40 min), Routine chronic condition follow-up.        Great to see you today.  I have refilled the medication(s) we provide.   If labs were collected, we will inform you of lab results once received either by echart message or telephone call.   - echart message- for normal results that have been seen by the patient already.   - telephone call: abnormal results or if patient has not viewed results in their echart.

## 2021-12-25 ENCOUNTER — Encounter (HOSPITAL_COMMUNITY): Payer: Self-pay | Admitting: Gastroenterology

## 2021-12-28 DIAGNOSIS — G4733 Obstructive sleep apnea (adult) (pediatric): Secondary | ICD-10-CM | POA: Diagnosis not present

## 2022-01-02 NOTE — Anesthesia Preprocedure Evaluation (Signed)
Anesthesia Evaluation  Patient identified by MRN, date of birth, ID band Patient awake    Reviewed: Allergy & Precautions, NPO status , Patient's Chart, lab work & pertinent test results  Airway Mallampati: II  TM Distance: >3 FB Neck ROM: Full    Dental no notable dental hx. (+) Teeth Intact, Dental Advisory Given   Pulmonary sleep apnea and Continuous Positive Airway Pressure Ventilation , PE   Pulmonary exam normal breath sounds clear to auscultation       Cardiovascular hypertension, Pt. on medications Normal cardiovascular exam Rhythm:Regular Rate:Normal  11/ 2022 Echo 1. Very difficult study due to obesity. Overall, EF appears preserved  ~55-60% but difficult to assess for WMA despite contrast. LV appears  mildly dilated in PLAX but very difficult windows. LV appears normal in  other views.  2. Left ventricular ejection fraction, by estimation, is 60 to 65%. The  left ventricle has normal function. Left ventricular endocardial border  not optimally defined to evaluate regional wall motion. The left  ventricular internal cavity size was mildly  dilated. Left ventricular diastolic parameters were normal.  3. Right ventricular systolic function is normal. The right ventricular  size is mildly enlarged. Tricuspid regurgitation signal is inadequate for  assessing PA pressure.  4. The mitral valve is grossly normal. No evidence of mitral valve  regurgitation. No evidence of mitral stenosis.  5. The aortic valve is tricuspid. Aortic valve regurgitation is not  visualized. No aortic stenosis is present.  6. Aortic dilatation noted. There is mild dilatation of the aortic root,  measuring 40 mm.  7. The inferior vena cava is normal in size with greater than 50%  respiratory variability, suggesting right atrial pressure of 3 mmHg.    Neuro/Psych  Headaches,    GI/Hepatic Neg liver ROS, GERD  Medicated,  Endo/Other   diabetesHypothyroidism Morbid obesity  Renal/GU      Musculoskeletal   Abdominal   Peds  Hematology   Anesthesia Other Findings All: lisinopril, crestor, Atorvastatin, statin  Reproductive/Obstetrics                            Anesthesia Physical Anesthesia Plan  ASA: 3  Anesthesia Plan: MAC   Post-op Pain Management:    Induction:   PONV Risk Score and Plan: Treatment may vary due to age or medical condition  Airway Management Planned:   Additional Equipment: None  Intra-op Plan:   Post-operative Plan:   Informed Consent:     Dental advisory given  Plan Discussed with:   Anesthesia Plan Comments: (Hx of colon polyos, OSA with CPAP, class 3 obesity)        Anesthesia Quick Evaluation

## 2022-01-03 ENCOUNTER — Ambulatory Visit (HOSPITAL_COMMUNITY): Payer: Federal, State, Local not specified - PPO | Admitting: Anesthesiology

## 2022-01-03 ENCOUNTER — Other Ambulatory Visit: Payer: Self-pay

## 2022-01-03 ENCOUNTER — Encounter (HOSPITAL_COMMUNITY): Payer: Self-pay | Admitting: Gastroenterology

## 2022-01-03 ENCOUNTER — Encounter (HOSPITAL_COMMUNITY)
Admission: RE | Disposition: A | Discharge status: Home / Self Care | Payer: Self-pay | Source: Home / Self Care | Attending: Gastroenterology

## 2022-01-03 ENCOUNTER — Ambulatory Visit (HOSPITAL_COMMUNITY)
Admission: RE | Admit: 2022-01-03 | Discharge: 2022-01-03 | Disposition: A | Discharge status: Home / Self Care | Payer: Federal, State, Local not specified - PPO | Attending: Gastroenterology | Admitting: Gastroenterology

## 2022-01-03 DIAGNOSIS — K635 Polyp of colon: Secondary | ICD-10-CM | POA: Diagnosis not present

## 2022-01-03 DIAGNOSIS — E1169 Type 2 diabetes mellitus with other specified complication: Secondary | ICD-10-CM

## 2022-01-03 DIAGNOSIS — J45909 Unspecified asthma, uncomplicated: Secondary | ICD-10-CM | POA: Diagnosis not present

## 2022-01-03 DIAGNOSIS — Z6841 Body Mass Index (BMI) 40.0 and over, adult: Secondary | ICD-10-CM | POA: Diagnosis not present

## 2022-01-03 DIAGNOSIS — Z1211 Encounter for screening for malignant neoplasm of colon: Secondary | ICD-10-CM | POA: Insufficient documentation

## 2022-01-03 DIAGNOSIS — E785 Hyperlipidemia, unspecified: Secondary | ICD-10-CM | POA: Diagnosis not present

## 2022-01-03 DIAGNOSIS — Z09 Encounter for follow-up examination after completed treatment for conditions other than malignant neoplasm: Secondary | ICD-10-CM | POA: Diagnosis not present

## 2022-01-03 DIAGNOSIS — I10 Essential (primary) hypertension: Secondary | ICD-10-CM | POA: Diagnosis not present

## 2022-01-03 DIAGNOSIS — E119 Type 2 diabetes mellitus without complications: Secondary | ICD-10-CM | POA: Insufficient documentation

## 2022-01-03 DIAGNOSIS — G4733 Obstructive sleep apnea (adult) (pediatric): Secondary | ICD-10-CM | POA: Insufficient documentation

## 2022-01-03 DIAGNOSIS — Z7901 Long term (current) use of anticoagulants: Secondary | ICD-10-CM | POA: Insufficient documentation

## 2022-01-03 DIAGNOSIS — Z9981 Dependence on supplemental oxygen: Secondary | ICD-10-CM | POA: Insufficient documentation

## 2022-01-03 DIAGNOSIS — Z79899 Other long term (current) drug therapy: Secondary | ICD-10-CM | POA: Insufficient documentation

## 2022-01-03 DIAGNOSIS — E038 Other specified hypothyroidism: Secondary | ICD-10-CM | POA: Diagnosis not present

## 2022-01-03 DIAGNOSIS — K621 Rectal polyp: Secondary | ICD-10-CM | POA: Diagnosis not present

## 2022-01-03 DIAGNOSIS — Z86711 Personal history of pulmonary embolism: Secondary | ICD-10-CM

## 2022-01-03 DIAGNOSIS — K219 Gastro-esophageal reflux disease without esophagitis: Secondary | ICD-10-CM | POA: Diagnosis not present

## 2022-01-03 DIAGNOSIS — K648 Other hemorrhoids: Secondary | ICD-10-CM | POA: Insufficient documentation

## 2022-01-03 DIAGNOSIS — Z86010 Personal history of colonic polyps: Secondary | ICD-10-CM | POA: Diagnosis not present

## 2022-01-03 DIAGNOSIS — K5730 Diverticulosis of large intestine without perforation or abscess without bleeding: Secondary | ICD-10-CM

## 2022-01-03 DIAGNOSIS — K760 Fatty (change of) liver, not elsewhere classified: Secondary | ICD-10-CM | POA: Insufficient documentation

## 2022-01-03 DIAGNOSIS — K6289 Other specified diseases of anus and rectum: Secondary | ICD-10-CM | POA: Diagnosis not present

## 2022-01-03 DIAGNOSIS — I2510 Atherosclerotic heart disease of native coronary artery without angina pectoris: Secondary | ICD-10-CM | POA: Insufficient documentation

## 2022-01-03 DIAGNOSIS — K641 Second degree hemorrhoids: Secondary | ICD-10-CM

## 2022-01-03 DIAGNOSIS — D123 Benign neoplasm of transverse colon: Secondary | ICD-10-CM

## 2022-01-03 HISTORY — PX: POLYPECTOMY: SHX5525

## 2022-01-03 HISTORY — PX: COLONOSCOPY WITH PROPOFOL: SHX5780

## 2022-01-03 LAB — GLUCOSE, CAPILLARY: Glucose-Capillary: 111 mg/dL — ABNORMAL HIGH (ref 70–99)

## 2022-01-03 SURGERY — COLONOSCOPY WITH PROPOFOL
Anesthesia: Monitor Anesthesia Care

## 2022-01-03 MED ORDER — PROPOFOL 1000 MG/100ML IV EMUL
INTRAVENOUS | Status: AC
  Filled 2022-01-03: qty 100

## 2022-01-03 MED ORDER — LACTATED RINGERS IV SOLN: INTRAVENOUS | Status: DC

## 2022-01-03 MED ORDER — LIDOCAINE 2% (20 MG/ML) 5 ML SYRINGE
INTRAMUSCULAR | Status: DC | PRN
  Administered 2022-01-03: 100 mg via INTRAVENOUS

## 2022-01-03 MED ORDER — PROPOFOL 10 MG/ML IV BOLUS
INTRAVENOUS | Status: DC | PRN
  Administered 2022-01-03 (×2): 20 mg via INTRAVENOUS

## 2022-01-03 MED ORDER — PROPOFOL 500 MG/50ML IV EMUL
INTRAVENOUS | Status: DC | PRN
  Administered 2022-01-03: 100 ug/kg/min via INTRAVENOUS

## 2022-01-03 MED ORDER — SODIUM CHLORIDE 0.9 % IV SOLN: INTRAVENOUS | Status: DC

## 2022-01-03 SURGICAL SUPPLY — 22 items

## 2022-01-03 NOTE — Transfer of Care (Signed)
Immediate Anesthesia Transfer of Care Note  Patient: MOHAMEDAMIN NIFONG  Procedure(s) Performed: COLONOSCOPY WITH PROPOFOL POLYPECTOMY  Patient Location: Endoscopy Unit  Anesthesia Type:MAC  Level of Consciousness: awake  Airway & Oxygen Therapy: Patient Spontanous Breathing and Patient connected to face mask oxygen  Post-op Assessment: Report given to RN and Post -op Vital signs reviewed and stable  Post vital signs: Reviewed and stable  Last Vitals:  Vitals Value Taken Time  BP    Temp    Pulse 66 01/03/22 0900  Resp 14 01/03/22 0900  SpO2 97 % 01/03/22 0900  Vitals shown include unvalidated device data.  Last Pain:  Vitals:   01/03/22 0757  TempSrc: Temporal  PainSc: 0-No pain         Complications: No notable events documented.

## 2022-01-03 NOTE — H&P (Signed)
GASTROENTEROLOGY PROCEDURE H&P NOTE   Primary Care Physician: Ma Hillock, DO    Reason for Procedure:  Colon polyp surveillance  Plan:    Colonoscopy   Due to elevated periprocedural risks 2/2 underlying comorbidities, procedure scheduled today at [facility] endoscopy unit.  The nature of the procedure, as well as the risks, benefits, and alternatives were carefully and thoroughly reviewed with the patient. Ample time for discussion and questions allowed. The patient understood, was satisfied, and agreed to proceed.     HPI: Adrian [name] is a 61 y.o. male with a history of OSA (on CPAP with supplemental O2), PE (provoked by long car ride; on Xarelto), obesity (BMI 46.7), HTN, HLD, hepatic steatosis, GERD, diverticulosis, diabetes, asthma, CAD, subclinical hypothyroidism, who presents for colonoscopy for ongoing polyp surveillance.  He has been holding his Xarelto x2 days in preparation for procedure today.  Endoscopic history: - Colonoscopy (10/2014, Dr. Cristina Gong): 2 Tubular Adenomas in ascending colon, 2 benign transverse colon polyps, sigmoid diverticulosis.  Repeat in 5 years.  Past Medical History:  Diagnosis Date   Adenomatous colon polyp    FS 02/2004; colo 2005 hyperplastic polyp; 2010 neg - butmucosal prolapse.    Allergy    Asthma    Atypical chest pain 05/19/2021   Blood in stool    CAD in native artery 07/28/2021   Chicken pox    Colon polyps    Diabetes mellitus without complication ([place]) 78/6767   Diverticulosis    Frequent headaches    Genital warts    GERD (gastroesophageal reflux disease)    Hemorrhoid    Hepatic steatosis 04/2019   Elevated liver enzymes-full work-up for infectious disease process with ultrasound normal with the exception of hepatic steatosis   Hyperlipidemia    Hypertension    Hypogonadism in male    labs x2: 11/14/2015 - 216 and 11/17/2015 193; declined med at that time.    Insomnia    OSA on CPAP 09/18/2016   HST  09/18/2016; ESS-4; AHI-76; O2 min- 61%   Pulmonary embolism ([place]) 01/2017   provoked by long care ride, on xarelto, est Dr. Melvyn Novas   Thyroid disease    subclinical hypothyroidism per records    Past Surgical History:  Procedure Laterality Date   ANKLE SURGERY     COLONOSCOPY  2016   dr Cristina Gong    ESOPHAGOGASTRODUODENOSCOPY     with dr Cristina Gong     Prior to Admission medications   Medication Sig Start Date End Date Taking? Authorizing Provider  amLODipine (NORVASC) 10 MG tablet Take 1 tablet (10 mg total) by mouth daily. 09/25/21 09/20/22 Yes Loel Dubonnet, NP  budesonide (PULMICORT) 180 MCG/ACT inhaler Inhale 2 puffs into the lungs in the morning and at bedtime. 12/13/21  Yes Kuneff, Renee A, DO  cetirizine (ZYRTEC) 10 MG tablet Take 1 tablet (10 mg total) by mouth daily. 12/13/21  Yes Kuneff, Renee A, DO  cyclobenzaprine (FLEXERIL) 10 MG tablet TAKE 1 TABLET BY MOUTH EVERYDAY AT BEDTIME PRN Patient taking differently: Take 10 mg by mouth at bedtime. TAKE 1 TABLET BY MOUTH EVERYDAY AT BEDTIME 12/13/21  Yes Kuneff, Renee A, DO  dapagliflozin propanediol (FARXIGA) 10 MG TABS tablet Take 1 tablet (10 mg total) by mouth daily before breakfast. 12/13/21  Yes Kuneff, Renee A, DO  Evolocumab (REPATHA) 140 MG/ML SOSY Inject 1 mL into the skin every 14 (fourteen) days. 12/13/21  Yes Kuneff, Renee A, DO  ezetimibe (ZETIA) 10 MG tablet Take  1 tablet (10 mg total) by mouth daily. 12/13/21  Yes Kuneff, Renee A, DO  fluticasone (FLONASE) 50 MCG/ACT nasal spray Place 2 sprays into both nostrils at bedtime.   Yes [provider]  gabapentin (NEURONTIN) 300 MG capsule TAKE 1 CAPSULE BY MOUTH EVERYDAY AT BEDTIME 12/13/21  Yes Kuneff, Renee A, DO  losartan (COZAAR) 100 MG tablet Take 1 tablet (100 mg total) by mouth daily. 12/13/21  Yes Kuneff, Renee A, DO  metFORMIN (GLUCOPHAGE) 1000 MG tablet Take 1 tablet (1,000 mg total) by mouth 2 (two) times daily with a meal. 12/13/21  Yes Kuneff, Renee A, DO   montelukast (SINGULAIR) 10 MG tablet Take 1 tablet (10 mg total) by mouth at bedtime. 12/13/21  Yes Kuneff, Renee A, DO  rivaroxaban (XARELTO) 10 MG TABS tablet Take 1 tablet (10 mg total) by mouth daily. 12/13/21  Yes Kuneff, Renee A, DO  spironolactone (ALDACTONE) 25 MG tablet Take 1 tablet (25 mg total) by mouth daily. 12/13/21  Yes Kuneff, Renee A, DO  UNABLE TO FIND Med Name: CPAP    [provider]    Current Facility-Administered Medications  Medication Dose Route Frequency Provider Last Rate Last Admin   0.9 %  sodium chloride infusion   Intravenous Continuous Dontay Harm V, DO        Allergies as of 10/19/2021 - Review Complete 10/19/2021  Allergen Reaction Noted   Atorvastatin  06/24/2020   Crestor [rosuvastatin]  06/24/2020   Lisinopril Cough 02/06/2018   Statins Other (See Comments) 02/01/2017    Family History  Problem Relation Age of Onset   Asthma Mother    Arthritis Mother    Asthma Sister    Asthma Brother    Allergies Brother    Alcohol abuse Maternal Grandmother    Cancer Maternal Grandmother    Hiatal hernia Maternal Grandfather    Colon cancer Neg Hx    Esophageal cancer Neg Hx     Social History   Socioeconomic History   Marital status: Married    Spouse name: Not on file   Number of children: 2   Years of education: Not on file   Highest education level: Associate degree: occupational, Hotel manager, or vocational program  Occupational History   Not on file  Tobacco Use   Smoking status: Never   Smokeless tobacco: Never  Vaping Use   Vaping Use: Never used  Substance and Sexual Activity   Alcohol use: Yes    Alcohol/week: 3.0 standard drinks of alcohol    Types: 3 Glasses of wine per week   Drug use: No   Sexual activity: Yes    Partners: Female  Other Topics Concern   Not on file  Social History Narrative   Marital status/children/pets: Married.   Education/employment: Associates degree, employed as a Surveyor, quantity:      -Wears a bicycle helmet riding a bike: No     -smoke alarm in the home:Yes     - wears seatbelt: Yes     - Feels safe in their relationships: Yes   Social Determinants of Health   Financial Resource Strain: Low Risk  (12/12/2021)   Overall Financial Resource Strain (CARDIA)    Difficulty of Paying Living Expenses: Not very hard  Food Insecurity: No Food Insecurity (12/12/2021)   Hunger Vital Sign    Worried About Running Out of Food in the Last Year: Never true    Ran Out of Food in the Last Year: Never  true  Transportation Needs: No Transportation Needs (12/12/2021)   PRAPARE - Hydrologist (Medical): No    Lack of Transportation (Non-Medical): No  Physical Activity: Sufficiently Active (12/12/2021)   Exercise Vital Sign    Days of Exercise per Week: 5 days    Minutes of Exercise per Session: 60 min  Stress: No Stress Concern Present (12/12/2021)   [place]    Feeling of Stress : Not at all  Social Connections: Moderately Integrated (12/12/2021)   Social Connection and Isolation Panel [NHANES]    Frequency of Communication with Friends and Family: Once a week    Frequency of Social Gatherings with Friends and Family: Once a week    Attends Religious Services: More than 4 times per year    Active Member of [employer] or Organizations: Yes    Attends Music therapist: More than 4 times per year    Marital Status: Married  Human resources officer Violence: Not on file    Physical Exam: Vital signs in last 24 hours: '@BP'$  (!) 172/103   Pulse 64   Temp 97.8 F (36.6 C) (Temporal)   Resp 16   Ht '6\' 1"'$  (1.854 m)   Wt (!) 153.3 kg   SpO2 94%   BMI 44.59 kg/m  GEN: NAD EYE: Sclerae anicteric ENT: MMM CV: Non-tachycardic Pulm: CTA b/l GI: Soft, NT/ND NEURO:  Alert & Oriented x 3   Gerrit Heck, DO [facility]   01/03/2022 8:10 AM

## 2022-01-03 NOTE — Interval H&P Note (Signed)
History and Physical Interval Note:  01/03/2022 8:12 AM  Adrian [name]  has presented today for surgery, with the diagnosis of Hx of colon polyos, OSA with CPAP, class 3 obesity.  The various methods of treatment have been discussed with the patient and family. After consideration of risks, benefits and other options for treatment, the patient has consented to  Procedure(s): COLONOSCOPY WITH PROPOFOL (N/A) as a surgical intervention.  The patient's history has been reviewed, patient examined, no change in status, stable for surgery.  I have reviewed the patient's chart and labs.  Questions were answered to the patient's satisfaction.     Dominic Pea Graceann Boileau

## 2022-01-03 NOTE — Anesthesia Postprocedure Evaluation (Signed)
Anesthesia Post Note  Patient: Adrian [name]  Procedure(s) Performed: COLONOSCOPY WITH PROPOFOL POLYPECTOMY     Patient location during evaluation: Endoscopy Anesthesia Type: MAC Level of consciousness: awake and alert Pain management: pain level controlled Vital Signs Assessment: post-procedure vital signs reviewed and stable Respiratory status: spontaneous breathing, nonlabored ventilation, respiratory function stable and patient connected to nasal cannula oxygen Cardiovascular status: blood pressure returned to baseline and stable Postop Assessment: no apparent nausea or vomiting Anesthetic complications: no   No notable events documented.  Last Vitals:  Vitals:   01/03/22 0910 01/03/22 0918  BP: (!) 156/96 (!) 154/99  Pulse: 63 (!) 57  Resp: 15 (!) 8  Temp: 36.8 C   SpO2: 97% 98%    Last Pain:  Vitals:   01/03/22 0918  TempSrc:   PainSc: 0-No pain                 Barnet Glasgow

## 2022-01-03 NOTE — Op Note (Signed)
[facility] Patient Name: Adrian [name] Procedure Date: 01/03/2022 MRN: [id number] Attending MD: Gerrit Heck , MD Date of Birth: 12-Feb-1961 CSN: [id number] Age: 61 Admit Type: Outpatient Procedure:                Colonoscopy Indications:              Surveillance: Personal history of adenomatous                            polyps on last colonoscopy > 5 years ago                           Last Colonoscopy was 10/2014 and n/f2 Tubular                            Adenomas in ascending colon, 2 benign transverse                            colon polyps, sigmoid diverticulosis. Providers:                Gerrit Heck, MD, Ladoris Gene, RN, William Dalton, Technician, Darliss Cheney, Technician Referring MD:              Medicines:                Monitored Anesthesia Care Complications:            No immediate complications. Estimated Blood Loss:     Estimated blood loss was minimal. Procedure:                Pre-Anesthesia Assessment:                           - Prior to the procedure, a History and Physical                            was performed, and patient medications and                            allergies were reviewed. The patient's tolerance of                            previous anesthesia was also reviewed. The risks                            and benefits of the procedure and the sedation                            options and risks were discussed with the patient.                            All questions were answered, and informed consent  was obtained. Prior Anticoagulants: The patient has                            taken Xarelto (rivaroxaban), last dose was 5 days                            prior to procedure. ASA Grade Assessment: III - A                            patient with severe systemic disease. After                            reviewing the risks and benefits, the patient was                             deemed in satisfactory condition to undergo the                            procedure.                           After obtaining informed consent, the colonoscope                            was passed under direct vision. Throughout the                            procedure, the patient's blood pressure, pulse, and                            oxygen saturations were monitored continuously. The                            CF-HQ190L ([id number] Olympus colonoscope was                            introduced through the anus and advanced to the the                            cecum, identified by appendiceal orifice and                            ileocecal valve. The colonoscopy was performed                            without difficulty. The patient tolerated the                            procedure well. The quality of the bowel                            preparation was good. The ileocecal valve,  appendiceal orifice, and rectum were photographed. Scope In: 8:30:54 AM Scope Out: 8:52:55 AM Scope Withdrawal Time: 0 hours 18 minutes 39 seconds  Total Procedure Duration: 0 hours 22 minutes 1 second  Findings:      Hemorrhoids were found on perianal exam.      A 4 mm polyp was found in the transverse colon. The polyp was sessile.       The polyp was removed with a cold snare. Resection and retrieval were       complete. Estimated blood loss was minimal.      Two sessile polyps were found in the rectum. The polyps were 2 to 3 mm       in size. These polyps were removed with a cold snare. Resection and       retrieval were complete. Estimated blood loss was minimal.      Multiple small and large-mouthed diverticula were found in the sigmoid       colon, descending colon and transverse colon.      Non-bleeding internal hemorrhoids were found during retroflexion. The       hemorrhoids were small. Impression:               - Hemorrhoids found on perianal exam.                            - One 4 mm polyp in the transverse colon, removed                            with a cold snare. Resected and retrieved.                           - Two 2 to 3 mm polyps in the rectum, removed with                            a cold snare. Resected and retrieved.                           - Diverticulosis in the sigmoid colon, in the                            descending colon and in the transverse colon.                           - Non-bleeding internal hemorrhoids. Moderate Sedation:      Not Applicable - Patient had care per Anesthesia. Recommendation:           - Patient has a contact number available for                            emergencies. The signs and symptoms of potential                            delayed complications were discussed with the                            patient. Return to normal activities tomorrow.  Written discharge instructions were provided to the                            patient.                           - Resume previous diet.                           - Continue present medications.                           - Await pathology results.                           - Repeat colonoscopy for surveillance based on                            pathology results.                           - Resume Xarelto (rivaroxaban) at prior dose                            tomorrow.                           - Return to GI clinic PRN. Procedure Code(s):        --- Professional ---                           [phone], Colonoscopy, flexible; with removal of                            tumor(s), polyp(s), or other lesion(s) by snare                            technique Diagnosis Code(s):        --- Professional ---                           Z86.010, Personal history of colonic polyps                           K64.8, Other hemorrhoids                           K63.5, Polyp of colon                           K62.1, Rectal polyp                            K57.30, Diverticulosis of large intestine without                            perforation or abscess without bleeding CPT copyright 2019 American Medical Association. All rights reserved. The codes documented in this report are preliminary  and upon coder review may  be revised to meet current compliance requirements. Gerrit Heck, MD 01/03/2022 9:03:47 AM Number of Addenda: 0

## 2022-01-04 LAB — SURGICAL PATHOLOGY

## 2022-01-07 ENCOUNTER — Encounter (HOSPITAL_COMMUNITY): Payer: Self-pay | Admitting: Gastroenterology

## 2022-01-10 DIAGNOSIS — G4733 Obstructive sleep apnea (adult) (pediatric): Secondary | ICD-10-CM | POA: Diagnosis not present

## 2022-01-12 DIAGNOSIS — G4733 Obstructive sleep apnea (adult) (pediatric): Secondary | ICD-10-CM | POA: Diagnosis not present

## 2022-01-27 DIAGNOSIS — G4733 Obstructive sleep apnea (adult) (pediatric): Secondary | ICD-10-CM | POA: Diagnosis not present

## 2022-02-01 ENCOUNTER — Ambulatory Visit: Payer: Federal, State, Local not specified - PPO | Admitting: Family Medicine

## 2022-02-01 ENCOUNTER — Encounter: Payer: Self-pay | Admitting: Family Medicine

## 2022-02-01 ENCOUNTER — Ambulatory Visit (INDEPENDENT_AMBULATORY_CARE_PROVIDER_SITE_OTHER): Payer: Federal, State, Local not specified - PPO

## 2022-02-01 VITALS — BP 119/74 | HR 64 | Temp 98.0°F | Ht 74.0 in | Wt 352.0 lb

## 2022-02-01 DIAGNOSIS — M25551 Pain in right hip: Secondary | ICD-10-CM

## 2022-02-01 DIAGNOSIS — M79604 Pain in right leg: Secondary | ICD-10-CM | POA: Diagnosis not present

## 2022-02-01 DIAGNOSIS — M79605 Pain in left leg: Secondary | ICD-10-CM

## 2022-02-01 DIAGNOSIS — M25552 Pain in left hip: Secondary | ICD-10-CM

## 2022-02-01 DIAGNOSIS — M160 Bilateral primary osteoarthritis of hip: Secondary | ICD-10-CM | POA: Diagnosis not present

## 2022-02-01 DIAGNOSIS — M79651 Pain in right thigh: Secondary | ICD-10-CM | POA: Diagnosis not present

## 2022-02-01 DIAGNOSIS — M6281 Muscle weakness (generalized): Secondary | ICD-10-CM | POA: Diagnosis not present

## 2022-02-01 DIAGNOSIS — M545 Low back pain, unspecified: Secondary | ICD-10-CM | POA: Diagnosis not present

## 2022-02-01 DIAGNOSIS — M79652 Pain in left thigh: Secondary | ICD-10-CM | POA: Diagnosis not present

## 2022-02-01 LAB — TSH: TSH: 2.26 u[IU]/mL (ref 0.35–5.50)

## 2022-02-01 LAB — COMPREHENSIVE METABOLIC PANEL
ALT: 40 U/L (ref 0–53)
AST: 24 U/L (ref 0–37)
Albumin: 4.2 g/dL (ref 3.5–5.2)
Alkaline Phosphatase: 63 U/L (ref 39–117)
BUN: 15 mg/dL (ref 6–23)
CO2: 27 mEq/L (ref 19–32)
Calcium: 10.3 mg/dL (ref 8.4–10.5)
Chloride: 103 mEq/L (ref 96–112)
Creatinine, Ser: 0.94 mg/dL (ref 0.40–1.50)
GFR: 88.21 mL/min (ref >60.00)
Glucose, Bld: 137 mg/dL — ABNORMAL HIGH (ref 70–99)
Potassium: 4.2 mEq/L (ref 3.5–5.1)
Sodium: 138 mEq/L (ref 135–145)
Total Bilirubin: 0.5 mg/dL (ref 0.2–1.2)
Total Protein: 7.1 g/dL (ref 6.0–8.3)

## 2022-02-01 LAB — SEDIMENTATION RATE: Sed Rate: 54 mm/hr — ABNORMAL HIGH (ref 0–20)

## 2022-02-01 LAB — VITAMIN D 25 HYDROXY (VIT D DEFICIENCY, FRACTURES): VITD: 29.06 ng/mL — ABNORMAL LOW (ref 30.00–100.00)

## 2022-02-01 NOTE — Progress Notes (Signed)
Adrian [name] , 09/10/1960, 61 y.o., male MRN: [id number] Patient Care Team    Relationship Specialty Notifications Start End  Ma Hillock, DO PCP - General Family Medicine  02/06/18   Skeet Latch, MD PCP - Cardiology Cardiology  09/22/21   Tanda Rockers, MD Consulting Physician Pulmonary Disease  02/07/18   Otelia Sergeant, OD Referring Physician   02/07/18   Lavena Bullion, DO Consulting Physician Gastroenterology  01/20/19     Chief Complaint  Patient presents with   Hip Pain    Pt c/o b/l hip L> and b/l leg x 3 mos worsening in the last 4 weeks;     Subjective: Pt presents for an OV with complaints of lateral hip and leg pain about 3 months duration, worsening over the last 4 weeks.  He reports the discomfort is mostly located anterior hip joint and anterior thighs.  He does feel it into his back at times.  Discomfort when lifting up the leg such as hip flexion.  Sitting seems to bother him as well.  He reports he was walking about 5 miles a day and now it bothers him to even go a short distance.  He denies any fever, chills, recent illness.  He denies any chest pain or shortness of breath.  He denies any rash.     06/08/2021    9:51 AM 05/17/2021    8:57 AM 07/06/2020    1:56 PM 04/07/2020    6:21 PM 06/04/2019    9:04 AM  Depression screen PHQ 2/9  Decreased Interest 0 0 0 0 0  Down, Depressed, Hopeless 0 0 0 0 0  PHQ - 2 Score 0 0 0 0 0    Allergies  Allergen Reactions   Atorvastatin     myalgias   Crestor [Rosuvastatin]      severe foot pains   Lisinopril Cough   Statins Other (See Comments)    Causes pain/ foot trouble   Social History   Social History Narrative   Marital status/children/pets: Married.   Education/employment: Associates degree, employed as a Armed forces operational officer:      -Wears a bicycle helmet riding a bike: No     -smoke alarm in the home:Yes     - wears seatbelt: Yes     - Feels safe in their relationships: Yes    Past Medical History:  Diagnosis Date   Adenomatous colon polyp    FS 02/2004; colo 2005 hyperplastic polyp; 2010 neg - butmucosal prolapse.    Allergy    Asthma    Atypical chest pain 05/19/2021   Blood in stool    CAD in native artery 07/28/2021   Chicken pox    Colon polyps    Diabetes mellitus without complication ([place]) 06/2448   Diverticulosis    Frequent headaches    Genital warts    GERD (gastroesophageal reflux disease)    Hemorrhoid    Hepatic steatosis 04/2019   Elevated liver enzymes-full work-up for infectious disease process with ultrasound normal with the exception of hepatic steatosis   Hyperlipidemia    Hypertension    Hypogonadism in male    labs x2: 11/14/2015 - 216 and 11/17/2015 193; declined med at that time.    Insomnia    OSA on CPAP 09/18/2016   HST 09/18/2016; ESS-4; AHI-76; O2 min- 61%   Pulmonary embolism ([place]) 01/2017   provoked by long care ride, on xarelto, est Dr.  Wert   Thyroid disease    subclinical hypothyroidism per records   Past Surgical History:  Procedure Laterality Date   ANKLE SURGERY     COLONOSCOPY  2016   dr Cristina Gong    COLONOSCOPY WITH PROPOFOL N/A 01/03/2022   Procedure: COLONOSCOPY WITH PROPOFOL;  Surgeon: Lavena Bullion, DO;  Location: [facility];  Service: Gastroenterology;  Laterality: N/A;   ESOPHAGOGASTRODUODENOSCOPY     with dr Cristina Gong    POLYPECTOMY  01/03/2022   Procedure: POLYPECTOMY;  Surgeon: Lavena Bullion, DO;  Location: [facility];  Service: Gastroenterology;;   Family History  Problem Relation Age of Onset   Asthma Mother    Arthritis Mother    Asthma Sister    Asthma Brother    Allergies Brother    Alcohol abuse Maternal Grandmother    Cancer Maternal Grandmother    Hiatal hernia Maternal Grandfather    Colon cancer Neg Hx    Esophageal cancer Neg Hx    Allergies as of 02/01/2022       Reactions   Atorvastatin    myalgias   Crestor [rosuvastatin]     severe foot pains   Lisinopril Cough    Statins Other (See Comments)   Causes pain/ foot trouble        Medication List        Accurate as of February 01, 2022 11:59 PM. If you have any questions, ask your nurse or doctor.          amLODipine 10 MG tablet Commonly known as: NORVASC Take 1 tablet (10 mg total) by mouth daily.   budesonide 180 MCG/ACT inhaler Commonly known as: PULMICORT Inhale 2 puffs into the lungs in the morning and at bedtime.   cetirizine 10 MG tablet Commonly known as: ZYRTEC Take 1 tablet (10 mg total) by mouth daily.   cyclobenzaprine 10 MG tablet Commonly known as: FLEXERIL TAKE 1 TABLET BY MOUTH EVERYDAY AT BEDTIME PRN What changed:  how much to take how to take this when to take this additional instructions   dapagliflozin propanediol 10 MG Tabs tablet Commonly known as: Farxiga Take 1 tablet (10 mg total) by mouth daily before breakfast.   ezetimibe 10 MG tablet Commonly known as: ZETIA Take 1 tablet (10 mg total) by mouth daily.   fluticasone 50 MCG/ACT nasal spray Commonly known as: FLONASE Place 2 sprays into both nostrils at bedtime.   gabapentin 300 MG capsule Commonly known as: NEURONTIN TAKE 1 CAPSULE BY MOUTH EVERYDAY AT BEDTIME   losartan 100 MG tablet Commonly known as: COZAAR Take 1 tablet (100 mg total) by mouth daily.   metFORMIN 1000 MG tablet Commonly known as: GLUCOPHAGE Take 1 tablet (1,000 mg total) by mouth 2 (two) times daily with a meal.   montelukast 10 MG tablet Commonly known as: SINGULAIR Take 1 tablet (10 mg total) by mouth at bedtime.   Repatha 140 MG/ML Sosy Generic drug: Evolocumab Inject 1 mL into the skin every 14 (fourteen) days.   rivaroxaban 10 MG Tabs tablet Commonly known as: Xarelto Take 1 tablet (10 mg total) by mouth daily.   spironolactone 25 MG tablet Commonly known as: ALDACTONE Take 1 tablet (25 mg total) by mouth daily.   UNABLE TO FIND Med Name: CPAP        All past medical history, surgical history,  allergies, family history, immunizations andmedications were updated in the EMR today and reviewed under the history and medication portions of their EMR.  ROS Negative, with the exception of above mentioned in HPI   Objective:  BP 119/74   Pulse 64   Temp 98 F (36.7 C) (Oral)   Ht $R'6\' 2"'oG$  (1.88 m)   Wt (!) 352 lb (159.7 kg)   SpO2 95%   BMI 45.19 kg/m  Body mass index is 45.19 kg/m. Physical Exam Vitals and nursing note reviewed.  Constitutional:      General: He is not in acute distress.    Appearance: Normal appearance. He is obese. He is not ill-appearing, toxic-appearing or diaphoretic.  HENT:     Head: Normocephalic and atraumatic.  Eyes:     General: No scleral icterus.       Right eye: No discharge.        Left eye: No discharge.     Extraocular Movements: Extraocular movements intact.     Pupils: Pupils are equal, round, and reactive to light.  Musculoskeletal:        General: No swelling or deformity.     Right hip: Tenderness present. Decreased range of motion. Decreased strength.     Left hip: Tenderness present. Decreased range of motion. Decreased strength.     Right lower leg: No edema.     Left lower leg: No edema.     Comments: +FABRE b/l (modified) Difficult exam d/t Body habitus   Skin:    General: Skin is warm and dry.     Coloration: Skin is not jaundiced or pale.     Findings: No rash.  Neurological:     Mental Status: He is alert and oriented to person, place, and time. Mental status is at baseline.  Psychiatric:        Mood and Affect: Mood normal.        Behavior: Behavior normal.        Thought Content: Thought content normal.        Judgment: Judgment normal.      No results found. No results found. No results found for this or any previous visit (from the past 24 hour(s)).  Assessment/Plan: MARILYN WING is a 61 y.o. male present for OV for  Bilateral hip pain/lateral lower extremity pain/muscle weakness Suspect degenerative  changes of his hips as likely cause of his symptoms.  Cannot rule out autoimmune disorders although low yield.  We will ensure thyroid and iron levels are also normal. Exam is consistent with hip etiology, Although difficult due to body habitus - Comp Met (CMET) - TSH - Iron, TIBC and Ferritin Panel - Vitamin D (25 hydroxy) - DG HIPS BILAT W OR W/O PELVIS 3-4 VIEWS; Future - DG Lumbar Spine Complete; Future - Sedimentation rate Patient will be called with results and further plan discussed at that time. Reviewed expectations re: course of current medical issues. Discussed self-management of symptoms. Outlined signs and symptoms indicating need for more acute intervention. Patient verbalized understanding and all questions were answered. Patient received an After-Visit Summary.    Orders Placed This Encounter  Procedures   DG HIPS BILAT W OR W/O PELVIS 3-4 VIEWS   DG Lumbar Spine Complete   Comp Met (CMET)   TSH   Iron, TIBC and Ferritin Panel   Vitamin D (25 hydroxy)   Sedimentation rate   No orders of the defined types were placed in this encounter.  Referral Orders  No referral(s) requested today     Note is dictated utilizing voice recognition software. Although note has been proof read prior to signing,  occasional typographical errors still can be missed. If any questions arise, please do not hesitate to call for verification.   electronically signed by:  Howard Pouch, DO  [place]

## 2022-02-01 NOTE — Patient Instructions (Signed)
Hip Pain The hip is the joint between the upper legs and the lower pelvis. The bones, cartilage, tendons, and muscles of your hip joint support your body and allow you to move around. Hip pain can range from a minor ache to severe pain in one or both of your hips. The pain may be felt on the inside of the hip joint near the groin, or on the outside near the buttocks and upper thigh. You may also have swelling or stiffness in your hip area. Follow these instructions at home: Managing pain, stiffness, and swelling     If directed, put ice on the painful area. To do this: Put ice in a plastic bag. Place a towel between your skin and the bag. Leave the ice on for 20 minutes, 2-3 times a day. If directed, apply heat to the affected area as often as told by your health care provider. Use the heat source that your health care provider recommends, such as a moist heat pack or a heating pad. Place a towel between your skin and the heat source. Leave the heat on for 20-30 minutes. Remove the heat if your skin turns bright red. This is especially important if you are unable to feel pain, heat, or cold. You may have a greater risk of getting burned. Activity Do exercises as told by your health care provider. Avoid activities that cause pain. General instructions  Take over-the-counter and prescription medicines only as told by your health care provider. Keep a journal of your symptoms. Write down: How often you have hip pain. The location of your pain. What the pain feels like. What makes the pain worse. Sleep with a pillow between your legs on your most comfortable side. Keep all follow-up visits as told by your health care provider. This is important. Contact a health care provider if: You cannot put weight on your leg. Your pain or swelling continues or gets worse after one week. It gets harder to walk. You have a fever. Get help right away if: You fall. You have a sudden increase in pain  and swelling in your hip. Your hip is red or swollen or very tender to touch. Summary Hip pain can range from a minor ache to severe pain in one or both of your hips. The pain may be felt on the inside of the hip joint near the groin, or on the outside near the buttocks and upper thigh. Avoid activities that cause pain. Write down how often you have hip pain, the location of the pain, what makes it worse, and what it feels like. This information is not intended to replace advice given to you by your health care provider. Make sure you discuss any questions you have with your health care provider. Document Revised: 11/24/2018 Document Reviewed: 11/24/2018 Elsevier Patient Education  2023 [employer]  

## 2022-02-02 ENCOUNTER — Telehealth: Payer: Self-pay | Admitting: Family Medicine

## 2022-02-02 LAB — IRON,TIBC AND FERRITIN PANEL
%SAT: 25 % (calc) (ref 20–48)
Ferritin: 153 ng/mL (ref 24–380)
Iron: 80 ug/dL (ref 50–180)
TIBC: 324 mcg/dL (calc) (ref 250–425)

## 2022-02-02 MED ORDER — PREDNISONE 20 MG PO TABS: ORAL_TABLET | ORAL | 0 refills | Status: DC

## 2022-02-02 NOTE — Telephone Encounter (Signed)
Please inform patient: His liver, kidney function and electrolytes are normal. His iron panel is normal. He has a mildly increased sed rate which is the inflammatory marker. His thyroid is functioning normal. Vitamin D level is a little low at 29.  I would encourage him to restart OTC vitamin D3 800 units daily.  He does have some lower back arthritic changes by x-ray.  I am still waiting on the results of his hips.  We will need to wait till Monday to get the final read.  In the meantime since his sed rate is elevated, I have called in a prednisone taper for the next 10 days that should help while we wait for final results.

## 2022-02-02 NOTE — Telephone Encounter (Signed)
Spoke with pt regarding labs and instructions.   

## 2022-02-05 ENCOUNTER — Telehealth: Payer: Self-pay | Admitting: Family Medicine

## 2022-02-05 DIAGNOSIS — M160 Bilateral primary osteoarthritis of hip: Secondary | ICD-10-CM

## 2022-02-05 NOTE — Telephone Encounter (Signed)
Spoke with pt regarding labs and instructions.   

## 2022-02-05 NOTE — Telephone Encounter (Signed)
X-rays results have returned. With elevated sed rate and x-ray result showing bilateral hip arthritis present, I suspect at least some of his pain is coming from his hip arthritis. 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  So I think starting with orthopedics is our first step.  Continue the prednisone until completion. We will need to avoid NSAIDs since he is required to use a blood thinner.  If pain worsens would need to see him back to consider starting a controlled substance, even if temporarily.  Referred to orthopedics to address bilateral hip pain.  If not seeing adequate improvement with the above steps, then we could consider performing autoimmune panel, however I suspect that is low yield compared to his hip arthritis.

## 2022-02-09 DIAGNOSIS — G4733 Obstructive sleep apnea (adult) (pediatric): Secondary | ICD-10-CM | POA: Diagnosis not present

## 2022-02-27 DIAGNOSIS — G4733 Obstructive sleep apnea (adult) (pediatric): Secondary | ICD-10-CM | POA: Diagnosis not present

## 2022-02-27 DIAGNOSIS — M160 Bilateral primary osteoarthritis of hip: Secondary | ICD-10-CM | POA: Diagnosis not present

## 2022-02-27 DIAGNOSIS — Z6841 Body Mass Index (BMI) 40.0 and over, adult: Secondary | ICD-10-CM | POA: Diagnosis not present

## 2022-03-12 DIAGNOSIS — G4733 Obstructive sleep apnea (adult) (pediatric): Secondary | ICD-10-CM | POA: Diagnosis not present

## 2022-03-14 DIAGNOSIS — M25652 Stiffness of left hip, not elsewhere classified: Secondary | ICD-10-CM | POA: Diagnosis not present

## 2022-03-14 DIAGNOSIS — M25552 Pain in left hip: Secondary | ICD-10-CM | POA: Diagnosis not present

## 2022-03-14 DIAGNOSIS — M25551 Pain in right hip: Secondary | ICD-10-CM | POA: Diagnosis not present

## 2022-03-27 ENCOUNTER — Ambulatory Visit (INDEPENDENT_AMBULATORY_CARE_PROVIDER_SITE_OTHER): Payer: Federal, State, Local not specified - PPO | Admitting: Cardiovascular Disease

## 2022-03-27 ENCOUNTER — Encounter (HOSPITAL_BASED_OUTPATIENT_CLINIC_OR_DEPARTMENT_OTHER): Payer: Self-pay | Admitting: Cardiovascular Disease

## 2022-03-27 VITALS — BP 136/72 | HR 63 | Ht 74.0 in | Wt 350.0 lb

## 2022-03-27 DIAGNOSIS — G4733 Obstructive sleep apnea (adult) (pediatric): Secondary | ICD-10-CM

## 2022-03-27 DIAGNOSIS — I10 Essential (primary) hypertension: Secondary | ICD-10-CM | POA: Diagnosis not present

## 2022-03-27 DIAGNOSIS — I2510 Atherosclerotic heart disease of native coronary artery without angina pectoris: Secondary | ICD-10-CM

## 2022-03-27 DIAGNOSIS — I272 Pulmonary hypertension, unspecified: Secondary | ICD-10-CM | POA: Diagnosis not present

## 2022-03-27 DIAGNOSIS — Z9989 Dependence on other enabling machines and devices: Secondary | ICD-10-CM

## 2022-03-27 DIAGNOSIS — I700 Atherosclerosis of aorta: Secondary | ICD-10-CM | POA: Diagnosis not present

## 2022-03-27 NOTE — Patient Instructions (Signed)
Medication Instructions:  Your physician recommends that you continue on your current medications as directed. Please refer to the Current Medication list given to you today.   *If you need a refill on your cardiac medications before your next appointment, please call your pharmacy*  Lab Work: NONE  Testing/Procedures: NONE  Follow-Up: At [facility], you and your health needs are our priority.  As part of our continuing mission to provide you with exceptional heart care, we have created designated Provider Care Teams.  These Care Teams include your primary Cardiologist (physician) and Advanced Practice Providers (APPs -  Physician Assistants and Nurse Practitioners) who all work together to provide you with the care you need, when you need it.  We recommend signing up for the patient portal called "MyChart".  Sign up information is provided on this After Visit Summary.  MyChart is used to connect with patients for Virtual Visits (Telemedicine).  Patients are able to view lab/test results, encounter notes, upcoming appointments, etc.  Non-urgent messages can be sent to your provider as well.   To learn more about what you can do with MyChart, go to [email or web address].    Your next appointment:   12 month(s)  The format for your next appointment:   In Person  Provider:   Skeet Latch, MD    Other Instructions MONITOR YOUR BLOOD PRESSURE TWICE A DAY FOR 2 WEEKS WITH GOAL OF LESS THAN 130/80. CALL THE OFFICE AT [phone] WITH UPDATED READINGS

## 2022-03-27 NOTE — Progress Notes (Signed)
Cardiology Office Note:    Date:  04/11/2022   ID:  Adrian [name], DOB 1961/04/15, MRN [id number]  PCP:  Ma Hillock, DO   [facility] Providers Cardiologist:  Skeet Latch, MD     Referring MD: Ma Hillock, DO   No chief complaint on file.  History of Present Illness:    Adrian [name] is a 61 y.o. male with a hx of hyperlipidemia, hypertension, diabetes mellitus, GERD, asthma, OSA on CPAP, pulmonary embolism (2018), hepatic steatosis, and hypogonadism, here for follow-up. He was initially seen 05/19/2021 for the evaluation of high pulmonary arterial pressure. He had an echo in 07/2017 with LVEF 55-60%. Right ventricular function was normal, and PASP could not be calculated because there was no tricuspid regurgitation. Prior to that he had an echo 01/2017 with PASP 61 mmHg. He has a history of statin intolerance but does well with Repatha.     Mr. Adrian [name] reported ongoing exertional dyspnea for 20 years. He also noted constant chest pain/pressure, and full abdominal expansion while walking (pressure relieved with belching). At night he would use 2 L oxygen with his CPAP. A coronary CTA 05/2021 showed mildly dilated aorta (38.6 mm) with minimal calcifications at the aortic root. There was a minimal (<24%) calcified plaque in the proximal LAD, and diffuse minimal calcifications throughout the proximal and mid LCX. Coronary calcium score of 163. This was 78 percentile. He followed up with our pharmacist 06/07/21 and his BP was 148/92 on Losartan 100 mg, Amlodipine 5 mg, HCTZ 25 mg. He was advised to increase amlodipine to 10 mg, but he deferred until he could follow-up with his PCP the following day. At that visit with his PCP his BP was 128/76, no medication changes were made.  At his last appointment he was doing well.  His blood pressure was above goal but he wanted to work on diet and exercise.  He was also referred to the prep program at the [facility].  He followed up with Laurann Montana, NP on 09/2021 and his home blood pressures were ranging in the 130s to low 140s over 80s to 90s.  Amlodipine was increased to 10 mg.  He doesn't check his BP regularly at home.  He reports recently taking up biking as a form of exercise and has been able to complete 50 miles on an electric bike without any issues. He describes feeling fantastic during this activity.  The patient denies experiencing any chest pain or pressure while exercising. He reports that breathing has been very good and he has not noticed any swelling. He is compliant with using his CPAP machine every night.  The patient is unsure of his current blood pressure readings at home but recalls a previous reading of 119/74.  He is taking all medications as prescribed.   Past Medical History:  Diagnosis Date   Adenomatous colon polyp    FS 02/2004; colo 2005 hyperplastic polyp; 2010 neg - butmucosal prolapse.    Allergy    Asthma    Atypical chest pain 05/19/2021   Blood in stool    CAD in native artery 07/28/2021   Chicken pox    Colon polyps    Diabetes mellitus without complication ([place]) 44/3154   Diverticulosis    Frequent headaches    Genital warts    GERD (gastroesophageal reflux disease)    Hemorrhoid    Hepatic steatosis 04/2019   Elevated liver enzymes-full work-up for infectious disease process with ultrasound normal with the  exception of hepatic steatosis   Hyperlipidemia    Hypertension    Hypogonadism in male    labs x2: 11/14/2015 - 216 and 11/17/2015 193; declined med at that time.    Insomnia    OSA on CPAP 09/18/2016   HST 09/18/2016; ESS-4; AHI-76; O2 min- 61%   Pulmonary embolism ([place]) 01/2017   provoked by long care ride, on xarelto, est Dr. Melvyn Novas   Thyroid disease    subclinical hypothyroidism per records    Past Surgical History:  Procedure Laterality Date   ANKLE SURGERY     COLONOSCOPY  2016   dr Cristina Gong    COLONOSCOPY WITH PROPOFOL N/A 01/03/2022   Procedure: COLONOSCOPY WITH PROPOFOL;   Surgeon: Lavena Bullion, DO;  Location: [facility];  Service: Gastroenterology;  Laterality: N/A;   ESOPHAGOGASTRODUODENOSCOPY     with dr Cristina Gong    POLYPECTOMY  01/03/2022   Procedure: POLYPECTOMY;  Surgeon: Lavena Bullion, DO;  Location: [facility];  Service: Gastroenterology;;    Current Medications: Current Meds  Medication Sig   amLODipine (NORVASC) 10 MG tablet Take 1 tablet (10 mg total) by mouth daily.   budesonide (PULMICORT) 180 MCG/ACT inhaler Inhale 2 puffs into the lungs in the morning and at bedtime.   cetirizine (ZYRTEC) 10 MG tablet Take 1 tablet (10 mg total) by mouth daily.   cyclobenzaprine (FLEXERIL) 10 MG tablet TAKE 1 TABLET BY MOUTH EVERYDAY AT BEDTIME PRN (Patient taking differently: Take 10 mg by mouth at bedtime. TAKE 1 TABLET BY MOUTH EVERYDAY AT BEDTIME)   dapagliflozin propanediol (FARXIGA) 10 MG TABS tablet Take 1 tablet (10 mg total) by mouth daily before breakfast.   Evolocumab (REPATHA) 140 MG/ML SOSY Inject 1 mL into the skin every 14 (fourteen) days.   ezetimibe (ZETIA) 10 MG tablet Take 1 tablet (10 mg total) by mouth daily.   fluticasone (FLONASE) 50 MCG/ACT nasal spray Place 2 sprays into both nostrils at bedtime.   gabapentin (NEURONTIN) 300 MG capsule TAKE 1 CAPSULE BY MOUTH EVERYDAY AT BEDTIME   losartan (COZAAR) 100 MG tablet Take 1 tablet (100 mg total) by mouth daily.   metFORMIN (GLUCOPHAGE) 1000 MG tablet Take 1 tablet (1,000 mg total) by mouth 2 (two) times daily with a meal.   montelukast (SINGULAIR) 10 MG tablet Take 1 tablet (10 mg total) by mouth at bedtime.   rivaroxaban (XARELTO) 10 MG TABS tablet Take 1 tablet (10 mg total) by mouth daily.   spironolactone (ALDACTONE) 25 MG tablet Take 1 tablet (25 mg total) by mouth daily.   UNABLE TO FIND Med Name: CPAP     Allergies:   Atorvastatin, Crestor [rosuvastatin], Lisinopril, and Statins   Social History   Socioeconomic History   Marital status: Married    Spouse name: Not  on file   Number of children: 2   Years of education: Not on file   Highest education level: Associate degree: occupational, Hotel manager, or vocational program  Occupational History   Not on file  Tobacco Use   Smoking status: Never   Smokeless tobacco: Never  Vaping Use   Vaping Use: Never used  Substance and Sexual Activity   Alcohol use: Yes    Alcohol/week: 3.0 standard drinks of alcohol    Types: 3 Glasses of wine per week   Drug use: No   Sexual activity: Yes    Partners: Female  Other Topics Concern   Not on file  Social History Narrative   Marital status/children/pets: Married.  Education/employment: Associates degree, employed as a Armed forces operational officer:      -Wears a bicycle helmet riding a bike: No     -smoke alarm in the home:Yes     - wears seatbelt: Yes     - Feels safe in their relationships: Yes   Social Determinants of Health   Financial Resource Strain: Low Risk  (12/12/2021)   Overall Financial Resource Strain (CARDIA)    Difficulty of Paying Living Expenses: Not very hard  Food Insecurity: No Food Insecurity (12/12/2021)   Hunger Vital Sign    Worried About Running Out of Food in the Last Year: Never true    [place] in the Last Year: Never true  Transportation Needs: No Transportation Needs (12/12/2021)   PRAPARE - Hydrologist (Medical): No    Lack of Transportation (Non-Medical): No  Physical Activity: Sufficiently Active (12/12/2021)   Exercise Vital Sign    Days of Exercise per Week: 5 days    Minutes of Exercise per Session: 60 min  Stress: No Stress Concern Present (12/12/2021)   [place]    Feeling of Stress : Not at all  Social Connections: Moderately Integrated (12/12/2021)   Social Connection and Isolation Panel [NHANES]    Frequency of Communication with Friends and Family: Once a week    Frequency of Social Gatherings with Friends  and Family: Once a week    Attends Religious Services: More than 4 times per year    Active Member of [employer] or Organizations: Yes    Attends Music therapist: More than 4 times per year    Marital Status: Married     Family History: The patient's family history includes Alcohol abuse in his maternal grandmother; Allergies in his brother; Arthritis in his mother; Asthma in his brother, mother, and sister; Cancer in his maternal grandmother; Hiatal hernia in his maternal grandfather. There is no history of Colon cancer or Esophageal cancer.  ROS:   Please see the history of present illness.    (+) Myalgias All other systems reviewed and are negative.  EKGs/Labs/Other Studies Reviewed:    The following studies were reviewed today:  Coronary CTA/Calcium Score 05/30/2021: IMPRESSION: 1. Coronary calcium score of 163. This was 66 percentile for age and sex matched control.   2. Normal coronary origin with right dominance.   3. CAD-RADS 1. Minimal non-obstructive CAD (0-24%). Consider non-atherosclerotic causes of chest pain. Consider preventive therapy and risk factor modification.  Echo 05/25/2021: Sonographer Comments: Technically difficult study due to poor echo windows and patient is morbidly obese. Image acquisition challenging due to patient body habitus.  IMPRESSIONS    1. Very difficult study due to obesity. Overall, EF appears preserved  ~55-60% but difficult to assess for WMA despite contrast. LV appears  mildly dilated in PLAX but very difficult windows. LV appears normal in  other views.   2. Left ventricular ejection fraction, by estimation, is 60 to 65%. The  left ventricle has normal function. Left ventricular endocardial border  not optimally defined to evaluate regional wall motion. The left  ventricular internal cavity size was mildly  dilated. Left ventricular diastolic parameters were normal.   3. Right ventricular systolic function is normal. The  right ventricular  size is mildly enlarged. Tricuspid regurgitation signal is inadequate for  assessing PA pressure.   4. The mitral valve is grossly normal. No evidence of mitral valve  regurgitation. No evidence of mitral stenosis.   5. The aortic valve is tricuspid. Aortic valve regurgitation is not  visualized. No aortic stenosis is present.   6. Aortic dilatation noted. There is mild dilatation of the aortic root,  measuring 40 mm.   7. The inferior vena cava is normal in size with greater than 50%  respiratory variability, suggesting right atrial pressure of 3 mmHg.  Echo 07/24/2017: Study Conclusions   - Left ventricle: The cavity size was normal. Systolic function was    normal. The estimated ejection fraction was in the range of 55%    to 60%. Wall motion was normal; there were no regional wall    motion abnormalities.  - Aortic valve: Transvalvular velocity was within the normal range.    There was no stenosis. There was no regurgitation.  - Mitral valve: Transvalvular velocity was within the normal range.    There was no evidence for stenosis. There was trivial    regurgitation.  - Left atrium: The atrium was severely dilated.  - Right ventricle: The cavity size was normal. Wall thickness was    normal. Systolic function was normal.  - Tricuspid valve: There was no regurgitation.   [place] LE Venous DVT 07/24/2017: Final Interpretation  Right: There is no evidence of deep vein thrombosis in the lower  extremity. No cystic structure found in the popliteal fossa.  Left: There is no evidence of deep vein thrombosis in the lower extremity.  No cystic structure found in the popliteal fossa.   CTA Chest 01/31/2017: IMPRESSION: 1. Acute bilateral pulmonary emboli, occlusive in RIGHT middle and RIGHT upper lobar artery's. CT evidence of right heart strain (RV/LV Ratio = 1.1) consistent with at least submassive (intermediate risk) PE. The presence of right heart strain has been  associated with an increased risk of morbidity and mortality. Please activate Code PE by paging [phone]. 2. Atelectasis without pulmonary infarct. 3. Critical Value/emergent results were called by telephone at the time of interpretation on 01/31/2017 at 10:19 pm to Dr. Brantley Stage , who verbally acknowledged these results.   Aortic Atherosclerosis (ICD10-I70.0).  EKG:    03/27/22: Sinus rhythm.  Rate 63 bpm. 05/19/2021: Sinus rhythm. Rate 64 bpm. LAFB. LVH.  Recent Labs: 06/08/2021: Hemoglobin 15.7; Platelets 233.0 02/01/2022: ALT 40; BUN 15; Creatinine, Ser 0.94; Potassium 4.2; Sodium 138; TSH 2.26   Recent Lipid Panel    Component Value Date/Time   CHOL 148 05/17/2021 0919   TRIG 149.0 05/17/2021 0919   HDL 37.50 (L) 05/17/2021 0919   CHOLHDL 4 05/17/2021 0919   VLDL 29.8 05/17/2021 0919   LDLCALC 81 05/17/2021 0919   LDLCALC 131 (H) 07/06/2020 1436   LDLDIRECT 133.0 02/17/2021 0835        Physical Exam:    Wt Readings from Last 3 Encounters:  03/27/22 (!) 350 lb (158.8 kg)  02/01/22 (!) 352 lb (159.7 kg)  01/03/22 (!) 338 lb (153.3 kg)     VS:  BP 136/72   Pulse 63   Ht '6\' 2"'$  (1.88 m)   Wt (!) 350 lb (158.8 kg)   BMI 44.94 kg/m  , BMI Body mass index is 44.94 kg/m. GENERAL:  Well appearing HEENT: Pupils equal round and reactive, fundi not visualized, oral mucosa unremarkable NECK:  No jugular venous distention, waveform within normal limits, carotid upstroke brisk and symmetric, no bruits, no thyromegaly LUNGS:  Clear to auscultation bilaterally HEART:  RRR.  PMI not displaced or sustained,S1 and S2 within normal limits,  no S3, no S4, no clicks, no rubs, no murmurs ABD:  Flat, positive bowel sounds normal in frequency in pitch, no bruits, no rebound, no guarding, no midline pulsatile mass, no hepatomegaly, no splenomegaly EXT:  2 plus pulses throughout, bilateral ankle 1+ edema R>L, no cyanosis no clubbing SKIN:  No rashes no nodules NEURO:  Cranial nerves II  through XII grossly intact, motor grossly intact throughout PSYCH:  Cognitively intact, oriented to person place and time   ASSESSMENT:    1. Primary hypertension   2. Aortic atherosclerosis ([place])   3. CAD in native artery   4. Pulmonary hypertension ([place])   5. OSA on CPAP      PLAN:    # Sleep apnea - Patient is using CPAP every night. Encourage continued compliance with CPAP therapy.  #  Hypertension - Blood pressure at the visit was 136/72, slightly above the goal of <130/80. Patient is currently on amlodipine, losartan, and spironolactone. BP at home has been controlled.  Instruct the patient to monitor blood pressure at home a couple of times a day for a week or two and report any significant changes.  Continue amlodipine, losartan and spironolactone.  # Hyperlipidemia - Cholesterol levels are well-controlled on Repatha and Zetia. Continue current medications and monitor lipid panel during upcoming fasting labs with primary care provider.  # Hip pain and gait changes - Patient reports pain in the hip and changes in gait during biking and walking. Recommend evaluation by primary care provider or referral to an orthopedic specialist for further assessment and management.   Disposition: FU with Law Corsino C. Oval Linsey, MD, [facility] in 1 year.  Medication Adjustments/Labs and Tests Ordered: Current medicines are reviewed at length with the patient today.  Concerns regarding medicines are outlined above.   Orders Placed This Encounter  Procedures   EKG 12-Lead   No orders of the defined types were placed in this encounter.  Patient Instructions  Medication Instructions:  Your physician recommends that you continue on your current medications as directed. Please refer to the Current Medication list given to you today.   *If you need a refill on your cardiac medications before your next appointment, please call your pharmacy*  Lab  Work: NONE  Testing/Procedures: NONE  Follow-Up: At [facility], you and your health needs are our priority.  As part of our continuing mission to provide you with exceptional heart care, we have created designated Provider Care Teams.  These Care Teams include your primary Cardiologist (physician) and Advanced Practice Providers (APPs -  Physician Assistants and Nurse Practitioners) who all work together to provide you with the care you need, when you need it.  We recommend signing up for the patient portal called "MyChart".  Sign up information is provided on this After Visit Summary.  MyChart is used to connect with patients for Virtual Visits (Telemedicine).  Patients are able to view lab/test results, encounter notes, upcoming appointments, etc.  Non-urgent messages can be sent to your provider as well.   To learn more about what you can do with MyChart, go to [email or web address].    Your next appointment:   12 month(s)  The format for your next appointment:   In Person  Provider:   Skeet Latch, MD    Other Instructions MONITOR YOUR BLOOD PRESSURE TWICE A DAY FOR 2 WEEKS WITH GOAL OF LESS THAN 130/80. CALL THE OFFICE AT [phone] WITH UPDATED READINGS  Signed, Skeet Latch, MD  04/11/2022 8:49 PM    [place]

## 2022-03-30 DIAGNOSIS — G4733 Obstructive sleep apnea (adult) (pediatric): Secondary | ICD-10-CM | POA: Diagnosis not present

## 2022-04-11 ENCOUNTER — Encounter (HOSPITAL_BASED_OUTPATIENT_CLINIC_OR_DEPARTMENT_OTHER): Payer: Self-pay | Admitting: Cardiovascular Disease

## 2022-04-17 DIAGNOSIS — G4733 Obstructive sleep apnea (adult) (pediatric): Secondary | ICD-10-CM | POA: Diagnosis not present

## 2022-04-29 DIAGNOSIS — G4733 Obstructive sleep apnea (adult) (pediatric): Secondary | ICD-10-CM | POA: Diagnosis not present

## 2022-05-02 DIAGNOSIS — G4733 Obstructive sleep apnea (adult) (pediatric): Secondary | ICD-10-CM | POA: Diagnosis not present

## 2022-05-04 ENCOUNTER — Ambulatory Visit: Payer: Federal, State, Local not specified - PPO | Admitting: Family Medicine

## 2022-05-17 DIAGNOSIS — G4733 Obstructive sleep apnea (adult) (pediatric): Secondary | ICD-10-CM | POA: Diagnosis not present

## 2022-05-30 DIAGNOSIS — G4733 Obstructive sleep apnea (adult) (pediatric): Secondary | ICD-10-CM | POA: Diagnosis not present

## 2022-05-31 ENCOUNTER — Other Ambulatory Visit: Payer: Self-pay | Admitting: Family Medicine

## 2022-06-01 ENCOUNTER — Telehealth: Payer: Self-pay | Admitting: *Deleted

## 2022-06-01 NOTE — Patient Outreach (Signed)
  Care Coordination   06/01/2022 Name: Adrian [name] MRN: [id number] DOB: 09-22-1960   Care Coordination Outreach Attempts:  An unsuccessful telephone outreach was attempted today to offer the patient information about available care coordination services as a benefit of their health plan.   Follow Up Plan:  Additional outreach attempts will be made to offer the patient care coordination information and services.   Encounter Outcome:  No Answer  Care Coordination Interventions Activated:  No   Care Coordination Interventions:  No, not indicated    Raina Mina, RN Care Management Coordinator [place] Office [phone]

## 2022-06-12 ENCOUNTER — Ambulatory Visit (INDEPENDENT_AMBULATORY_CARE_PROVIDER_SITE_OTHER): Payer: Federal, State, Local not specified - PPO | Admitting: Family Medicine

## 2022-06-12 ENCOUNTER — Encounter: Payer: Self-pay | Admitting: Family Medicine

## 2022-06-12 VITALS — BP 112/75 | HR 62 | Temp 97.4°F | Ht 72.0 in | Wt 351.2 lb

## 2022-06-12 DIAGNOSIS — J4530 Mild persistent asthma, uncomplicated: Secondary | ICD-10-CM

## 2022-06-12 DIAGNOSIS — M546 Pain in thoracic spine: Secondary | ICD-10-CM

## 2022-06-12 DIAGNOSIS — E1169 Type 2 diabetes mellitus with other specified complication: Secondary | ICD-10-CM

## 2022-06-12 DIAGNOSIS — I700 Atherosclerosis of aorta: Secondary | ICD-10-CM

## 2022-06-12 DIAGNOSIS — Z125 Encounter for screening for malignant neoplasm of prostate: Secondary | ICD-10-CM

## 2022-06-12 DIAGNOSIS — G8929 Other chronic pain: Secondary | ICD-10-CM

## 2022-06-12 DIAGNOSIS — Z6841 Body Mass Index (BMI) 40.0 and over, adult: Secondary | ICD-10-CM

## 2022-06-12 DIAGNOSIS — Z0000 Encounter for general adult medical examination without abnormal findings: Secondary | ICD-10-CM

## 2022-06-12 DIAGNOSIS — E782 Mixed hyperlipidemia: Secondary | ICD-10-CM

## 2022-06-12 DIAGNOSIS — R931 Abnormal findings on diagnostic imaging of heart and coronary circulation: Secondary | ICD-10-CM

## 2022-06-12 DIAGNOSIS — E559 Vitamin D deficiency, unspecified: Secondary | ICD-10-CM | POA: Diagnosis not present

## 2022-06-12 DIAGNOSIS — E785 Hyperlipidemia, unspecified: Secondary | ICD-10-CM

## 2022-06-12 DIAGNOSIS — Z7901 Long term (current) use of anticoagulants: Secondary | ICD-10-CM

## 2022-06-12 DIAGNOSIS — Z789 Other specified health status: Secondary | ICD-10-CM

## 2022-06-12 DIAGNOSIS — I10 Essential (primary) hypertension: Secondary | ICD-10-CM | POA: Diagnosis not present

## 2022-06-12 DIAGNOSIS — Z23 Encounter for immunization: Secondary | ICD-10-CM

## 2022-06-12 LAB — LIPID PANEL
Cholesterol: 144 mg/dL (ref 0–200)
HDL: 34 mg/dL — ABNORMAL LOW (ref >39.00)
LDL Cholesterol: 82 mg/dL (ref 0–99)
NonHDL: 110.42
Total CHOL/HDL Ratio: 4
Triglycerides: 144 mg/dL (ref 0.0–149.0)
VLDL: 28.8 mg/dL (ref 0.0–40.0)

## 2022-06-12 LAB — COMPREHENSIVE METABOLIC PANEL
ALT: 40 U/L (ref 0–53)
AST: 27 U/L (ref 0–37)
Albumin: 4.1 g/dL (ref 3.5–5.2)
Alkaline Phosphatase: 69 U/L (ref 39–117)
BUN: 13 mg/dL (ref 6–23)
CO2: 31 mEq/L (ref 19–32)
Calcium: 10 mg/dL (ref 8.4–10.5)
Chloride: 102 mEq/L (ref 96–112)
Creatinine, Ser: 0.93 mg/dL (ref 0.40–1.50)
GFR: 89.13 mL/min (ref >60.00)
Glucose, Bld: 122 mg/dL — ABNORMAL HIGH (ref 70–99)
Potassium: 4.6 mEq/L (ref 3.5–5.1)
Sodium: 138 mEq/L (ref 135–145)
Total Bilirubin: 0.4 mg/dL (ref 0.2–1.2)
Total Protein: 7 g/dL (ref 6.0–8.3)

## 2022-06-12 LAB — CBC
HCT: 44.3 % (ref 39.0–52.0)
Hemoglobin: 14.8 g/dL (ref 13.0–17.0)
MCHC: 33.3 g/dL (ref 30.0–36.0)
MCV: 88.6 fl (ref 78.0–100.0)
Platelets: 246 10*3/uL (ref 150.0–400.0)
RBC: 5.01 Mil/uL (ref 4.22–5.81)
RDW: 14 % (ref 11.5–15.5)
WBC: 8 10*3/uL (ref 4.0–10.5)

## 2022-06-12 LAB — VITAMIN D 25 HYDROXY (VIT D DEFICIENCY, FRACTURES): VITD: 45.97 ng/mL (ref 30.00–100.00)

## 2022-06-12 LAB — HEMOGLOBIN A1C: Hgb A1c MFr Bld: 7.2 % — ABNORMAL HIGH (ref 4.6–6.5)

## 2022-06-12 LAB — PSA: PSA: 1.01 ng/mL (ref 0.10–4.00)

## 2022-06-12 LAB — TSH: TSH: 2.75 u[IU]/mL (ref 0.35–5.50)

## 2022-06-12 MED ORDER — DAPAGLIFLOZIN PROPANEDIOL 10 MG PO TABS
10.0000 mg | ORAL_TABLET | Freq: Every day | ORAL | 1 refills | Status: DC
Start: 2022-06-12 — End: 2022-11-26

## 2022-06-12 MED ORDER — MONTELUKAST SODIUM 10 MG PO TABS: 10.0000 mg | ORAL_TABLET | Freq: Every day | ORAL | 3 refills | Status: DC

## 2022-06-12 MED ORDER — LOSARTAN POTASSIUM 100 MG PO TABS: 100.0000 mg | ORAL_TABLET | Freq: Every day | ORAL | 1 refills | Status: DC

## 2022-06-12 MED ORDER — AMLODIPINE BESYLATE 10 MG PO TABS: 10.0000 mg | ORAL_TABLET | Freq: Every day | ORAL | 1 refills | Status: DC

## 2022-06-12 MED ORDER — SPIRONOLACTONE 25 MG PO TABS: 25.0000 mg | ORAL_TABLET | Freq: Every day | ORAL | 1 refills | Status: DC

## 2022-06-12 MED ORDER — EZETIMIBE 10 MG PO TABS: 10.0000 mg | ORAL_TABLET | Freq: Every day | ORAL | 3 refills | Status: DC

## 2022-06-12 MED ORDER — REPATHA 140 MG/ML ~~LOC~~ SOSY: 1.0000 mL | PREFILLED_SYRINGE | SUBCUTANEOUS | 3 refills | Status: DC

## 2022-06-12 MED ORDER — RIVAROXABAN 10 MG PO TABS: 10.0000 mg | ORAL_TABLET | Freq: Every day | ORAL | 1 refills | Status: DC

## 2022-06-12 MED ORDER — METFORMIN HCL 1000 MG PO TABS: 1000.0000 mg | ORAL_TABLET | Freq: Two times a day (BID) | ORAL | 1 refills | Status: DC

## 2022-06-12 MED ORDER — GABAPENTIN 300 MG PO CAPS: ORAL_CAPSULE | ORAL | 2 refills | Status: DC

## 2022-06-12 MED ORDER — CYCLOBENZAPRINE HCL 10 MG PO TABS: ORAL_TABLET | ORAL | 5 refills | Status: DC

## 2022-06-12 MED ORDER — BUDESONIDE 180 MCG/ACT IN AEPB: 2.0000 | INHALATION_SPRAY | Freq: Two times a day (BID) | RESPIRATORY_TRACT | 11 refills | Status: DC

## 2022-06-12 NOTE — Patient Instructions (Signed)
No follow-ups on file.        Great to see you today.  I have refilled the medication(s) we provide.   If labs were collected, we will inform you of lab results once received either by echart message or telephone call.   - echart message- for normal results that have been seen by the patient already.   - telephone call: abnormal results or if patient has not viewed results in their echart. Health Maintenance, Male Adopting a healthy lifestyle and getting preventive care are important in promoting health and wellness. Ask your health care provider about: The right schedule for you to have regular tests and exams. Things you can do on your own to prevent diseases and keep yourself healthy. What should I know about diet, weight, and exercise? Eat a healthy diet  Eat a diet that includes plenty of vegetables, fruits, low-fat dairy products, and lean protein. Do not eat a lot of foods that are high in solid fats, added sugars, or sodium. Maintain a healthy weight Body mass index (BMI) is a measurement that can be used to identify possible weight problems. It estimates body fat based on height and weight. Your health care provider can help determine your BMI and help you achieve or maintain a healthy weight. Get regular exercise Get regular exercise. This is one of the most important things you can do for your health. Most adults should: Exercise for at least 150 minutes each week. The exercise should increase your heart rate and make you sweat (moderate-intensity exercise). Do strengthening exercises at least twice a week. This is in addition to the moderate-intensity exercise. Spend less time sitting. Even light physical activity can be beneficial. Watch cholesterol and blood lipids Have your blood tested for lipids and cholesterol at 60 years of age, then have this test every 5 years. You may need to have your cholesterol levels checked more often if: Your lipid or cholesterol levels  are high. You are older than 60 years of age. You are at high risk for heart disease. What should I know about cancer screening? Many types of cancers can be detected early and may often be prevented. Depending on your health history and family history, you may need to have cancer screening at various ages. This may include screening for: Colorectal cancer. Prostate cancer. Skin cancer. Lung cancer. What should I know about heart disease, diabetes, and high blood pressure? Blood pressure and heart disease High blood pressure causes heart disease and increases the risk of stroke. This is more likely to develop in people who have high blood pressure readings or are overweight. Talk with your health care provider about your target blood pressure readings. Have your blood pressure checked: Every 3-5 years if you are 18-39 years of age. Every year if you are 40 years old or older. If you are between the ages of 65 and 75 and are a current or former smoker, ask your health care provider if you should have a one-time screening for abdominal aortic aneurysm (AAA). Diabetes Have regular diabetes screenings. This checks your fasting blood sugar level. Have the screening done: Once every three years after age 45 if you are at a normal weight and have a low risk for diabetes. More often and at a younger age if you are overweight or have a high risk for diabetes. What should I know about preventing infection? Hepatitis B If you have a higher risk for hepatitis B, you should be screened for this   virus. Talk with your health care provider to find out if you are at risk for hepatitis B infection. Hepatitis C Blood testing is recommended for: Everyone born from 1945 through 1965. Anyone with known risk factors for hepatitis C. Sexually transmitted infections (STIs) You should be screened each year for STIs, including gonorrhea and chlamydia, if: You are sexually active and are younger than 60 years of  age. You are older than 60 years of age and your health care provider tells you that you are at risk for this type of infection. Your sexual activity has changed since you were last screened, and you are at increased risk for chlamydia or gonorrhea. Ask your health care provider if you are at risk. Ask your health care provider about whether you are at high risk for HIV. Your health care provider may recommend a prescription medicine to help prevent HIV infection. If you choose to take medicine to prevent HIV, you should first get tested for HIV. You should then be tested every 3 months for as long as you are taking the medicine. Follow these instructions at home: Alcohol use Do not drink alcohol if your health care provider tells you not to drink. If you drink alcohol: Limit how much you have to 0-2 drinks a day. Know how much alcohol is in your drink. In the [place] one drink equals one 12 oz bottle of beer (355 mL), one 5 oz glass of wine (148 mL), or one 1 oz glass of hard liquor (44 mL). Lifestyle Do not use any products that contain nicotine or tobacco. These products include cigarettes, chewing tobacco, and vaping devices, such as e-cigarettes. If you need help quitting, ask your health care provider. Do not use street drugs. Do not share needles. Ask your health care provider for help if you need support or information about quitting drugs. General instructions Schedule regular health, dental, and eye exams. Stay current with your vaccines. Tell your health care provider if: You often feel depressed. You have ever been abused or do not feel safe at home. Summary Adopting a healthy lifestyle and getting preventive care are important in promoting health and wellness. Follow your health care provider's instructions about healthy diet, exercising, and getting tested or screened for diseases. Follow your health care provider's instructions on monitoring your cholesterol and blood  pressure. This information is not intended to replace advice given to you by your health care provider. Make sure you discuss any questions you have with your health care provider. Document Revised: 11/28/2020 Document Reviewed: 11/28/2020 Elsevier Patient Education  2023 [employer]  

## 2022-06-12 NOTE — Progress Notes (Signed)
Patient ID: Adrian [name], male  DOB: 03/24/1961, 61 y.o.   MRN: [id number] Patient Care Team    Relationship Specialty Notifications Start End  Ma Hillock, DO PCP - General Family Medicine  02/06/18   Skeet Latch, MD PCP - Cardiology Cardiology  09/22/21   Tanda Rockers, MD Consulting Physician Pulmonary Disease  02/07/18   Otelia Sergeant, OD Referring Physician   02/07/18   Lavena Bullion, DO Consulting Physician Gastroenterology  01/20/19     Chief Complaint  Patient presents with   Annual Exam    [facility]; pt is fasting    Subjective: Adrian [name] is a 61 y.o. male present for [place]. All past medical history, surgical history, allergies, family history, immunizations, medications and social history were updated in the electronic medical record today. All recent labs, ED visits and hospitalizations within the last year were reviewed.  Health maintenance:  Colonoscopy: Completed 01/03/2022 Dr. Bryan Lemma 7-year follow-up immunizations:  tdap UTD 2020, influenza administered today, PNA series completed, zostavax completed Infectious disease screening: HIV completed, Hep C completed.  PSA:  Lab Results  Component Value Date   PSA 1.31 06/08/2021   PSA 0.91 06/04/2019    Essential hypertension/hyperlipidemia/morbid obesity/CKD3 Pt reports compliance with losartan 100 mg QD, amlodipine 5 mg and HCTZ 25 mg daily. Patient denies chest pain, shortness of breath, dizziness or lower extremity edema.  Pt is not taking a daily baby ASA. Pt is not prescribed statin 2/2 intolerance x2. Compliance with zeita and repatha tolerating. Labs due next appointment Diet: low Salt Exercise: Routine exercise RF: Hypertension, hyperlipidemia, obesity, diabetes.    Type 2 diabetes mellitus without complication, without long-term current use of insulin ([place]) Patient reports compliance with metformin 1000 mg daily daily and farxiga 10 mg qd. Patient denies dizziness, hyperglycemic or  hypoglycemic events. Patient denies numbness, tingling in the extremities or nonhealing wounds of feet.  He states he has cut back on his sugar consumption.  He is prescribed gabapentin  300 mg nightly and reports it is very helpful-mostly for back/leg pain. Diabetes diagnosed 2019  Asthma: Patient reports his asthma has been rather stable. Not needing albuterol breakthrough.  Compliant with Pulmicort and Singulair nightly. He is now taking a daily antihistamine.       06/12/2022    9:32 AM 06/08/2021    9:51 AM 05/17/2021    8:57 AM 07/06/2020    1:56 PM 04/07/2020    6:21 PM  Depression screen PHQ 2/9  Decreased Interest 0 0 0 0 0  Down, Depressed, Hopeless 0 0 0 0 0  PHQ - 2 Score 0 0 0 0 0       No data to display               04/07/2020    6:21 PM 08/19/2018   12:00 PM 04/23/2018    6:06 PM 02/06/2018    8:43 AM  Fall Risk   Falls in the past year? 0 0 No No  Number falls in past yr: 0     Injury with Fall? 0     Follow up Falls evaluation completed       Immunization History  Administered Date(s) Administered   Influenza Split 05/03/2009, 05/19/2010, 04/27/2011, 05/02/2012, 05/18/2013   Influenza Whole 04/22/2017   Influenza,inj,Quad PF,6+ Mos 05/20/2015, 05/17/2016, 04/24/2017, 05/12/2018, 05/15/2019, 04/06/2020, 06/08/2021, 06/12/2022   Influenza,inj,quad, With Preservative 05/12/2014, 05/20/2015   Moderna Sars-Covid-2 Vaccination 11/23/2019, 12/28/2019, 07/30/2020   Pneumococcal Conjugate-13  05/12/2018   Pneumococcal Polysaccharide-23 02/02/2017   Td 07/24/2003   Tdap 11/13/2010, 06/04/2019   Zoster Recombinat (Shingrix) 06/04/2019, 10/27/2019   Past Medical History:  Diagnosis Date   Adenomatous colon polyp    FS 02/2004; colo 2005 hyperplastic polyp; 2010 neg - butmucosal prolapse.    Allergy    Asthma    Atypical chest pain 05/19/2021   Blood in stool    CAD in native artery 07/28/2021   Chicken pox    Colon polyps    Diabetes mellitus without  complication ([place]) 26/9485   Diverticulosis    Frequent headaches    Genital warts    GERD (gastroesophageal reflux disease)    Hemorrhoid    Hepatic steatosis 04/2019   Elevated liver enzymes-full work-up for infectious disease process with ultrasound normal with the exception of hepatic steatosis   Hyperlipidemia    Hypertension    Hypogonadism in male    labs x2: 11/14/2015 - 216 and 11/17/2015 193; declined med at that time.    Insomnia    OSA on CPAP 09/18/2016   HST 09/18/2016; ESS-4; AHI-76; O2 min- 61%   Pulmonary embolism ([place]) 01/2017   provoked by long care ride, on xarelto, est Dr. Melvyn Novas   Thyroid disease    subclinical hypothyroidism per records   Allergies  Allergen Reactions   Atorvastatin     myalgias   Crestor [Rosuvastatin]      severe foot pains   Lisinopril Cough   Statins Other (See Comments)    Causes pain/ foot trouble   Past Surgical History:  Procedure Laterality Date   ANKLE SURGERY     COLONOSCOPY  2016   dr Cristina Gong    COLONOSCOPY WITH PROPOFOL N/A 01/03/2022   Procedure: COLONOSCOPY WITH PROPOFOL;  Surgeon: Lavena Bullion, DO;  Location: [facility];  Service: Gastroenterology;  Laterality: N/A;   ESOPHAGOGASTRODUODENOSCOPY     with dr Cristina Gong    POLYPECTOMY  01/03/2022   Procedure: POLYPECTOMY;  Surgeon: Lavena Bullion, DO;  Location: [facility];  Service: Gastroenterology;;   Family History  Problem Relation Age of Onset   Asthma Mother    Arthritis Mother    Asthma Sister    Asthma Brother    Allergies Brother    Alcohol abuse Maternal Grandmother    Cancer Maternal Grandmother    Hiatal hernia Maternal Grandfather    Colon cancer Neg Hx    Esophageal cancer Neg Hx    Social History   Social History Narrative   Marital status/children/pets: Married.   Education/employment: Associates degree, employed as a Armed forces operational officer:      -Wears a bicycle helmet riding a bike: No     -smoke alarm in the home:Yes     -  wears seatbelt: Yes     - Feels safe in their relationships: Yes    Allergies as of 06/12/2022       Reactions   Atorvastatin    myalgias   Crestor [rosuvastatin]     severe foot pains   Lisinopril Cough   Statins Other (See Comments)   Causes pain/ foot trouble        Medication List        Accurate as of June 12, 2022  9:46 AM. If you have any questions, ask your nurse or doctor.          amLODipine 10 MG tablet Commonly known as: NORVASC Take 1 tablet (10 mg total) by mouth daily.  budesonide 180 MCG/ACT inhaler Commonly known as: PULMICORT Inhale 2 puffs into the lungs in the morning and at bedtime.   cetirizine 10 MG tablet Commonly known as: ZYRTEC Take 1 tablet (10 mg total) by mouth daily.   cyclobenzaprine 10 MG tablet Commonly known as: FLEXERIL TAKE 1 TABLET BY MOUTH EVERYDAY AT BEDTIME PRN What changed:  how much to take how to take this when to take this additional instructions   dapagliflozin propanediol 10 MG Tabs tablet Commonly known as: Farxiga Take 1 tablet (10 mg total) by mouth daily before breakfast.   ezetimibe 10 MG tablet Commonly known as: ZETIA Take 1 tablet (10 mg total) by mouth daily.   fluticasone 50 MCG/ACT nasal spray Commonly known as: FLONASE Place 2 sprays into both nostrils at bedtime.   gabapentin 300 MG capsule Commonly known as: NEURONTIN TAKE 1 CAPSULE BY MOUTH EVERYDAY AT BEDTIME   losartan 100 MG tablet Commonly known as: COZAAR Take 1 tablet (100 mg total) by mouth daily.   metFORMIN 1000 MG tablet Commonly known as: GLUCOPHAGE Take 1 tablet (1,000 mg total) by mouth 2 (two) times daily with a meal.   montelukast 10 MG tablet Commonly known as: SINGULAIR Take 1 tablet (10 mg total) by mouth at bedtime.   Repatha 140 MG/ML Sosy Generic drug: Evolocumab Inject 1 mL into the skin every 14 (fourteen) days.   rivaroxaban 10 MG Tabs tablet Commonly known as: Xarelto Take 1 tablet (10 mg  total) by mouth daily.   spironolactone 25 MG tablet Commonly known as: ALDACTONE Take 1 tablet (25 mg total) by mouth daily.   UNABLE TO FIND Med Name: CPAP       All past medical history, surgical history, allergies, family history, immunizations andmedications were updated in the EMR today and reviewed under the history and medication portions of their EMR.      CT CORONARY MORPH W/CTA COR W/SCORE W/CA W/CM &/OR WO/CM Addendum Date: 05/30/2021   IMPRESSION: 1. Coronary calcium score of 163. This was 86 percentile for age and sex matched control. 2. Normal coronary origin with right dominance. 3. CAD-RADS 1. Minimal non-obstructive CAD (0-24%). Consider non-atherosclerotic causes of chest pain. Consider preventive therapy and risk factor modification. Berniece Salines, DO [facility] Electronically Signed   By: Berniece Salines D.O.   On: 05/30/2021 14:29   Result Date: 05/30/2021 IMPRESSION: 1.  Aortic Atherosclerosis (ICD10-I70.0). 2. Hepatic steatosis. Electronically Signed: By: Vinnie Langton M.D. On: 05/30/2021 13:40    ROS: 14 pt review of systems performed and negative (unless mentioned in an HPI)  Objective: BP 112/75   Pulse 62   Temp (!) 97.4 F (36.3 C)   Ht 6' (1.829 m)   Wt (!) 351 lb 3.2 oz (159.3 kg)   SpO2 96%   BMI 47.63 kg/m  Physical Exam Constitutional:      General: He is not in acute distress.    Appearance: Normal appearance. He is obese. He is not ill-appearing, toxic-appearing or diaphoretic.  HENT:     Head: Normocephalic and atraumatic.     Right Ear: Tympanic membrane, ear canal and external ear normal. There is no impacted cerumen.     Left Ear: Tympanic membrane, ear canal and external ear normal. There is no impacted cerumen.     Nose: Nose normal. No congestion or rhinorrhea.     Mouth/Throat:     Mouth: Mucous membranes are moist.     Pharynx: Oropharynx is clear. No oropharyngeal exudate or  posterior oropharyngeal erythema.  Eyes:     General: No  scleral icterus.       Right eye: No discharge.        Left eye: No discharge.     Extraocular Movements: Extraocular movements intact.     Pupils: Pupils are equal, round, and reactive to light.  Cardiovascular:     Rate and Rhythm: Normal rate and regular rhythm.     Pulses: Normal pulses.     Heart sounds: Normal heart sounds. No murmur heard.    No friction rub. No gallop.  Pulmonary:     Effort: Pulmonary effort is normal. No respiratory distress.     Breath sounds: Normal breath sounds. No stridor. No wheezing, rhonchi or rales.  Chest:     Chest wall: No tenderness.  Abdominal:     General: Abdomen is flat. Bowel sounds are normal. There is no distension.     Palpations: Abdomen is soft. There is no mass.     Tenderness: There is no abdominal tenderness. There is no right CVA tenderness, left CVA tenderness, guarding or rebound.     Hernia: No hernia is present.  Musculoskeletal:        General: No swelling or tenderness. Normal range of motion.     Cervical back: Normal range of motion and neck supple.     Right lower leg: No edema.     Left lower leg: No edema.  Lymphadenopathy:     Cervical: No cervical adenopathy.  Skin:    General: Skin is warm and dry.     Coloration: Skin is not jaundiced.     Findings: No bruising, lesion or rash.  Neurological:     General: No focal deficit present.     Mental Status: He is alert and oriented to person, place, and time. Mental status is at baseline.     Cranial Nerves: No cranial nerve deficit.     Sensory: No sensory deficit.     Motor: No weakness.     Coordination: Coordination normal.     Gait: Gait normal.     Deep Tendon Reflexes: Reflexes normal.  Psychiatric:        Mood and Affect: Mood normal.        Behavior: Behavior normal.        Thought Content: Thought content normal.        Judgment: Judgment normal.    Diabetic Foot Exam - Simple   Simple Foot Form Diabetic Foot exam was performed with the following  findings: Yes 06/12/2022  9:33 AM  Visual Inspection No deformities, no ulcerations, no other skin breakdown bilaterally: Yes Sensation Testing Intact to touch and monofilament testing bilaterally: Yes Pulse Check Posterior Tibialis and Dorsalis pulse intact bilaterally: Yes Comments     Assessment/plan: Adrian [name] is a 61 y.o. male present for CPE/CMC Elevated liver enzymes/Hepatic steatosis -Acute hepatitis and autoimmune hepatitis, HIV>negative -Abdominal ultrasound > hepatic steatosis.  -LFTs have been stable .   Type 2 diabetes mellitus with hyperlipidemia goal of less than 7. -continue   metformin 1000 milligrams to twice daily dosing.  -continue   farxiga 10 mg qd Statin intolerant > continue zeita and repatha Continue diet and exercise modifications  PNA series: Pneumonia series completed Flu shot: Up-to-datetoday (recommneded yearly) Microalbumin: UTD- due next visit Foot exam: Completed 06/12/2022 Eye exam: 04/2020 Dr. Laurette Schimke him to make appt A1c: 6.6 >>5.8>>6.3>> 7.2>7.1> 8.9 >7.7> 6.8> 7.2> 6.5 >A1c collected today  - Comprehensive  metabolic panel - Lipid panel - Hemoglobin A1c - TSH - CBC Mild persistent asthma without complication/Non-seasonal allergic rhinitis due to pollen stable Pulmicort 1-2 puffs BID.(fornualry changed from Asmanex) Continue Singulair 10 mg daily.  Continue  Zyrtec 10 mg nightly   Chronic thoracic back pain: Stable  Continue  gabapentin nightly  Continue  Flexeril 10 mg nightly as needed.     H/o PE- chronic anticoagulation- acquired thrombophilia(lifetime) Continue  xarelto   Essential hypertension/morbid obesity/HLD< 57 ldl goal/Aortic atherosclerosis/statin intolerant/morbid obesity-Body mass index is 47.63 kg/m./[place] coronary artery calcium score between 100 and 199 (163- 78%) Stable-at goal - Goal :130/80 with is DM history.  Continue losartan 100 mg daily. Continue  spironolactone 25 mg Continue   amlodipine 10 mg daily Continue Zetia 10 mg daily Continue Repatha injections every 2 weeks.  Lipids responded well. Low sodium diet Diet and exercise changes recommended Subclincal hypothyroidism TSH collected today Vitamin D deficiency - Vitamin D (25 hydroxy) Influenza vaccine needed Administered today Prostate cancer screening - PSA Routine general medical examination at a health care facility Colonoscopy: Completed 01/03/2022 Dr. Bryan Lemma 7-year follow-up immunizations:  tdap UTD 2020, influenza administered today, PNA series completed, zostavax completed Infectious disease screening: HIV completed, Hep C completed.  Patient was encouraged to exercise greater than 150 minutes a week. Patient was encouraged to choose a diet filled with fresh fruits and vegetables, and lean meats. AVS provided to patient today for education/recommendation on gender specific health and safety maintenance.   Return in about 24 weeks (around 11/27/2022) for Routine chronic condition follow-up.   Orders Placed This Encounter  Procedures   Flu Vaccine QUAD 83moIM (Fluarix, Fluzone & Alfiuria Quad PF)   Comprehensive metabolic panel   Lipid panel   Hemoglobin A1c   TSH   PSA   CBC   Vitamin D (25 hydroxy)   Meds ordered this encounter  Medications   amLODipine (NORVASC) 10 MG tablet    Sig: Take 1 tablet (10 mg total) by mouth daily.    Dispense:  90 tablet    Refill:  1   budesonide (PULMICORT) 180 MCG/ACT inhaler    Sig: Inhale 2 puffs into the lungs in the morning and at bedtime.    Dispense:  1 each    Refill:  11   cyclobenzaprine (FLEXERIL) 10 MG tablet    Sig: TAKE 1 TABLET BY MOUTH EVERYDAY AT BEDTIME PRN    Dispense:  30 tablet    Refill:  5   dapagliflozin propanediol (FARXIGA) 10 MG TABS tablet    Sig: Take 1 tablet (10 mg total) by mouth daily before breakfast.    Dispense:  90 tablet    Refill:  1   Evolocumab (REPATHA) 140 MG/ML SOSY    Sig: Inject 1 mL into the skin  every 14 (fourteen) days.    Dispense:  6 mL    Refill:  3   ezetimibe (ZETIA) 10 MG tablet    Sig: Take 1 tablet (10 mg total) by mouth daily.    Dispense:  90 tablet    Refill:  3   gabapentin (NEURONTIN) 300 MG capsule    Sig: TAKE 1 CAPSULE BY MOUTH EVERYDAY AT BEDTIME    Dispense:  90 capsule    Refill:  2   losartan (COZAAR) 100 MG tablet    Sig: Take 1 tablet (100 mg total) by mouth daily.    Dispense:  90 tablet    Refill:  1   metFORMIN (GLUCOPHAGE)  1000 MG tablet    Sig: Take 1 tablet (1,000 mg total) by mouth 2 (two) times daily with a meal.    Dispense:  180 tablet    Refill:  1   montelukast (SINGULAIR) 10 MG tablet    Sig: Take 1 tablet (10 mg total) by mouth at bedtime.    Dispense:  90 tablet    Refill:  3   rivaroxaban (XARELTO) 10 MG TABS tablet    Sig: Take 1 tablet (10 mg total) by mouth daily.    Dispense:  90 tablet    Refill:  1   spironolactone (ALDACTONE) 25 MG tablet    Sig: Take 1 tablet (25 mg total) by mouth daily.    Dispense:  90 tablet    Refill:  1   Referral Orders  No referral(s) requested today    Note is dictated utilizing voice recognition software. Although note has been proof read prior to signing, occasional typographical errors still can be missed. If any questions arise, please do not hesitate to call for verification.  Electronically signed by: Howard Pouch, DO [place]

## 2022-06-17 DIAGNOSIS — G4733 Obstructive sleep apnea (adult) (pediatric): Secondary | ICD-10-CM | POA: Diagnosis not present

## 2022-06-29 DIAGNOSIS — G4733 Obstructive sleep apnea (adult) (pediatric): Secondary | ICD-10-CM | POA: Diagnosis not present

## 2022-07-11 ENCOUNTER — Telehealth: Payer: Self-pay | Admitting: *Deleted

## 2022-07-11 NOTE — Patient Outreach (Signed)
  Care Coordination   07/11/2022 Name: Adrian [name] MRN: [id number] DOB: [birth year]   Care Coordination Outreach Attempts:  A second unsuccessful outreach was attempted today to offer the patient with information about available care coordination services as a benefit of their health plan.     Follow Up Plan:  Additional outreach attempts will be made to offer the patient care coordination information and services.   Encounter Outcome:  No Answer   Care Coordination Interventions:  No, not indicated    Raina Mina, RN Care Management Coordinator [place] Office [phone]

## 2022-07-17 DIAGNOSIS — G4733 Obstructive sleep apnea (adult) (pediatric): Secondary | ICD-10-CM | POA: Diagnosis not present

## 2022-07-30 DIAGNOSIS — G4733 Obstructive sleep apnea (adult) (pediatric): Secondary | ICD-10-CM | POA: Diagnosis not present

## 2022-08-17 DIAGNOSIS — G4733 Obstructive sleep apnea (adult) (pediatric): Secondary | ICD-10-CM | POA: Diagnosis not present

## 2022-08-29 ENCOUNTER — Telehealth: Payer: Self-pay

## 2022-08-29 NOTE — Patient Outreach (Signed)
  Care Coordination   08/29/2022 Name: Adrian [name] MRN: [id number] DOB: 1960/12/05   Care Coordination Outreach Attempts:  A third unsuccessful outreach was attempted today to offer the patient with information about available care coordination services as a benefit of their health plan.   Follow Up Plan:  No further outreach attempts will be made at this time. We have been unable to contact the patient to offer or enroll patient in care coordination services  Encounter Outcome:  No Answer   Care Coordination Interventions:  No, not indicated    Jone Baseman, RN, MSN [place] [facility] Care Management Coordinator Direct Line [phone]

## 2022-08-30 DIAGNOSIS — G4733 Obstructive sleep apnea (adult) (pediatric): Secondary | ICD-10-CM | POA: Diagnosis not present

## 2022-09-10 DIAGNOSIS — H2513 Age-related nuclear cataract, bilateral: Secondary | ICD-10-CM | POA: Diagnosis not present

## 2022-09-10 DIAGNOSIS — Z7984 Long term (current) use of oral hypoglycemic drugs: Secondary | ICD-10-CM | POA: Diagnosis not present

## 2022-09-10 DIAGNOSIS — H524 Presbyopia: Secondary | ICD-10-CM | POA: Diagnosis not present

## 2022-09-10 LAB — HM DIABETES EYE EXAM

## 2022-09-17 DIAGNOSIS — G4733 Obstructive sleep apnea (adult) (pediatric): Secondary | ICD-10-CM | POA: Diagnosis not present

## 2022-09-28 DIAGNOSIS — G4733 Obstructive sleep apnea (adult) (pediatric): Secondary | ICD-10-CM | POA: Diagnosis not present

## 2022-10-29 DIAGNOSIS — G4733 Obstructive sleep apnea (adult) (pediatric): Secondary | ICD-10-CM | POA: Diagnosis not present

## 2022-11-12 ENCOUNTER — Other Ambulatory Visit: Payer: Self-pay | Admitting: Family Medicine

## 2022-11-21 ENCOUNTER — Other Ambulatory Visit: Payer: Self-pay | Admitting: Family Medicine

## 2022-11-21 ENCOUNTER — Telehealth: Payer: Self-pay | Admitting: Family Medicine

## 2022-11-21 ENCOUNTER — Ambulatory Visit (INDEPENDENT_AMBULATORY_CARE_PROVIDER_SITE_OTHER): Payer: Federal, State, Local not specified - PPO

## 2022-11-21 ENCOUNTER — Encounter: Payer: Self-pay | Admitting: Family Medicine

## 2022-11-21 ENCOUNTER — Ambulatory Visit: Payer: Federal, State, Local not specified - PPO | Admitting: Family Medicine

## 2022-11-21 VITALS — BP 111/69 | HR 66 | Temp 97.8°F | Wt 356.4 lb

## 2022-11-21 DIAGNOSIS — I10 Essential (primary) hypertension: Secondary | ICD-10-CM

## 2022-11-21 DIAGNOSIS — R0602 Shortness of breath: Secondary | ICD-10-CM | POA: Diagnosis not present

## 2022-11-21 DIAGNOSIS — J209 Acute bronchitis, unspecified: Secondary | ICD-10-CM

## 2022-11-21 DIAGNOSIS — R051 Acute cough: Secondary | ICD-10-CM

## 2022-11-21 DIAGNOSIS — Z86711 Personal history of pulmonary embolism: Secondary | ICD-10-CM

## 2022-11-21 DIAGNOSIS — E1169 Type 2 diabetes mellitus with other specified complication: Secondary | ICD-10-CM

## 2022-11-21 DIAGNOSIS — R059 Cough, unspecified: Secondary | ICD-10-CM | POA: Diagnosis not present

## 2022-11-21 LAB — POC COVID19 BINAXNOW: SARS Coronavirus 2 Ag: NEGATIVE

## 2022-11-21 MED ORDER — GABAPENTIN 300 MG PO CAPS: ORAL_CAPSULE | ORAL | 2 refills | Status: DC

## 2022-11-21 MED ORDER — ALBUTEROL SULFATE HFA 108 (90 BASE) MCG/ACT IN AERS: 2.0000 | INHALATION_SPRAY | Freq: Four times a day (QID) | RESPIRATORY_TRACT | 2 refills | Status: DC | PRN

## 2022-11-21 MED ORDER — AZITHROMYCIN 250 MG PO TABS: ORAL_TABLET | ORAL | 0 refills | Status: AC

## 2022-11-21 NOTE — Telephone Encounter (Signed)
Spoke with patient regarding results/recommendations.  

## 2022-11-21 NOTE — Progress Notes (Unsigned)
Adrian [name] , Aug 07, 1960, 62 y.o., male MRN: [id number] Patient Care Team    Relationship Specialty Notifications Start End  Natalia Leatherwood, DO PCP - General Family Medicine  02/06/18   Chilton Si, MD PCP - Cardiology Cardiology  09/22/21   Nyoka Cowden, MD Consulting Physician Pulmonary Disease  02/07/18   Jill Side, OD Referring Physician   02/07/18   Shellia Cleverly, DO Consulting Physician Gastroenterology  01/20/19     Chief Complaint  Patient presents with   Cough    Onset 2 weeks; SOB; denied HA; does cough up mucus     Subjective: Adrian [name] is a 62 y.o. Pt presents for an OV with complaints of productive cough of 2 weeks  duration.  Associated symptoms include shortness of breath. Pt denies missing any xarelto doses. Tightness in his chest- not getting enough air in.  Significant PMH: PE, allergic rhinitis      06/12/2022    9:32 AM 06/08/2021    9:51 AM 05/17/2021    8:57 AM 07/06/2020    1:56 PM 04/07/2020    6:21 PM  Depression screen PHQ 2/9  Decreased Interest 0 0 0 0 0  Down, Depressed, Hopeless 0 0 0 0 0  PHQ - 2 Score 0 0 0 0 0    Allergies  Allergen Reactions   Atorvastatin     myalgias   Crestor [Rosuvastatin]      severe foot pains   Lisinopril Cough   Statins Other (See Comments)    Causes pain/ foot trouble   Social History   Social History Narrative   Marital status/children/pets: Married.   Education/employment: Associates degree, employed as a Tax adviser:      -Wears a bicycle helmet riding a bike: No     -smoke alarm in the home:Yes     - wears seatbelt: Yes     - Feels safe in their relationships: Yes   Past Medical History:  Diagnosis Date   Adenomatous colon polyp    FS 02/2004; colo 2005 hyperplastic polyp; 2010 neg - butmucosal prolapse.    Allergy    Asthma    Atypical chest pain 05/19/2021   Blood in stool    CAD in native artery 07/28/2021   Chicken pox    Colon polyps     Diabetes mellitus without complication (HCC) 01/2017   Diverticulosis    Frequent headaches    Genital warts    GERD (gastroesophageal reflux disease)    Hemorrhoid    Hepatic steatosis 04/2019   Elevated liver enzymes-full work-up for infectious disease process with ultrasound normal with the exception of hepatic steatosis   Hyperlipidemia    Hypertension    Hypogonadism in male    labs x2: 11/14/2015 - 216 and 11/17/2015 193; declined med at that time.    Insomnia    OSA on CPAP 09/18/2016   HST 09/18/2016; ESS-4; AHI-76; O2 min- 61%   Pulmonary embolism (HCC) 01/2017   provoked by long care ride, on xarelto, est Dr. Sherene Sires   Thyroid disease    subclinical hypothyroidism per records   Past Surgical History:  Procedure Laterality Date   ANKLE SURGERY     COLONOSCOPY  2016   dr Matthias Hughs    COLONOSCOPY WITH PROPOFOL N/A 01/03/2022   Procedure: COLONOSCOPY WITH PROPOFOL;  Surgeon: Shellia Cleverly, DO;  Location: [facility];  Service: Gastroenterology;  Laterality: N/A;  ESOPHAGOGASTRODUODENOSCOPY     with dr Matthias Hughs    POLYPECTOMY  01/03/2022   Procedure: POLYPECTOMY;  Surgeon: Shellia Cleverly, DO;  Location: [facility];  Service: Gastroenterology;;   Family History  Problem Relation Age of Onset   Asthma Mother    Arthritis Mother    Asthma Sister    Asthma Brother    Allergies Brother    Alcohol abuse Maternal Grandmother    Cancer Maternal Grandmother    Hiatal hernia Maternal Grandfather    Colon cancer Neg Hx    Esophageal cancer Neg Hx    Allergies as of 11/21/2022       Reactions   Atorvastatin    myalgias   Crestor [rosuvastatin]     severe foot pains   Lisinopril Cough   Statins Other (See Comments)   Causes pain/ foot trouble        Medication List        Accurate as of Nov 21, 2022  2:27 PM. If you have any questions, ask your nurse or doctor.          amLODipine 10 MG tablet Commonly known as: NORVASC Take 1 tablet (10 mg total) by  mouth daily.   budesonide 180 MCG/ACT inhaler Commonly known as: PULMICORT Inhale 2 puffs into the lungs in the morning and at bedtime.   cetirizine 10 MG tablet Commonly known as: ZYRTEC Take 1 tablet (10 mg total) by mouth daily.   cyclobenzaprine 10 MG tablet Commonly known as: FLEXERIL TAKE 1 TABLET BY MOUTH EVERYDAY AT BEDTIME PRN   dapagliflozin propanediol 10 MG Tabs tablet Commonly known as: Farxiga Take 1 tablet (10 mg total) by mouth daily before breakfast.   ezetimibe 10 MG tablet Commonly known as: ZETIA Take 1 tablet (10 mg total) by mouth daily.   fluticasone 50 MCG/ACT nasal spray Commonly known as: FLONASE Place 2 sprays into both nostrils at bedtime.   gabapentin 300 MG capsule Commonly known as: NEURONTIN TAKE 1 CAPSULE BY MOUTH EVERYDAY AT BEDTIME   losartan 100 MG tablet Commonly known as: COZAAR Take 1 tablet (100 mg total) by mouth daily.   metFORMIN 1000 MG tablet Commonly known as: GLUCOPHAGE Take 1 tablet (1,000 mg total) by mouth 2 (two) times daily with a meal.   montelukast 10 MG tablet Commonly known as: SINGULAIR Take 1 tablet (10 mg total) by mouth at bedtime.   Repatha 140 MG/ML Sosy Generic drug: Evolocumab Inject 1 mL into the skin every 14 (fourteen) days.   rivaroxaban 10 MG Tabs tablet Commonly known as: Xarelto Take 1 tablet (10 mg total) by mouth daily.   spironolactone 25 MG tablet Commonly known as: ALDACTONE Take 1 tablet (25 mg total) by mouth daily.   UNABLE TO FIND Med Name: CPAP        All past medical history, surgical history, allergies, family history, immunizations andmedications were updated in the EMR today and reviewed under the history and medication portions of their EMR.     ROS Negative, with the exception of above mentioned in HPI   Objective:  BP 111/69   Pulse 66   Temp 97.8 F (36.6 C)   Wt (!) 356 lb 6.4 oz (161.7 kg)   SpO2 96%   BMI 48.34 kg/m  Body mass index is 48.34  kg/m. Physical Exam Vitals and nursing note reviewed.  Constitutional:      General: He is not in acute distress.    Appearance: Normal appearance. He is  not ill-appearing, toxic-appearing or diaphoretic.  HENT:     Head: Normocephalic and atraumatic.     Right Ear: Tympanic membrane and ear canal normal.     Left Ear: Tympanic membrane and ear canal normal.     Nose: Congestion and rhinorrhea present.     Mouth/Throat:     Mouth: Mucous membranes are moist.     Pharynx: No oropharyngeal exudate or posterior oropharyngeal erythema.  Eyes:     General: No scleral icterus.       Right eye: No discharge.        Left eye: No discharge.     Extraocular Movements: Extraocular movements intact.     Pupils: Pupils are equal, round, and reactive to light.  Cardiovascular:     Rate and Rhythm: Normal rate and regular rhythm.     Heart sounds: No murmur heard. Pulmonary:     Effort: Pulmonary effort is normal. No respiratory distress.     Breath sounds: Normal breath sounds. No wheezing, rhonchi or rales.  Musculoskeletal:     Cervical back: Neck supple.     Right lower leg: No edema.     Left lower leg: No edema.  Lymphadenopathy:     Cervical: No cervical adenopathy.  Skin:    General: Skin is warm.     Findings: No rash.  Neurological:     Mental Status: He is alert and oriented to person, place, and time. Mental status is at baseline.  Psychiatric:        Mood and Affect: Mood normal.        Behavior: Behavior normal.        Thought Content: Thought content normal.        Judgment: Judgment normal.    No results found. No results found. Results for orders placed or performed in visit on 11/21/22 (from the past 24 hour(s))  POC COVID-19 BinaxNow     Status: None   Collection Time: 11/21/22  2:27 PM  Result Value Ref Range   SARS Coronavirus 2 Ag Negative Negative    Assessment/Plan: Adrian [name] is a 62 y.o. male present for OV for  *** Reviewed expectations re:  course of current medical issues. Discussed self-management of symptoms. Outlined signs and symptoms indicating need for more acute intervention. Patient verbalized understanding and all questions were answered. Patient received an After-Visit Summary.    Orders Placed This Encounter  Procedures   POC COVID-19 BinaxNow   No orders of the defined types were placed in this encounter.  Referral Orders  No referral(s) requested today     Note is dictated utilizing voice recognition software. Although note has been proof read prior to signing, occasional typographical errors still can be missed. If any questions arise, please do not hesitate to call for verification.   electronically signed by:  Felix Pacini, DO  [facility] [place]

## 2022-11-21 NOTE — Patient Instructions (Signed)
No follow-ups on file.        Great to see you today.  I have refilled the medication(s) we provide.   If labs were collected, we will inform you of lab results once received either by echart message or telephone call.   - echart message- for normal results that have been seen by the patient already.   - telephone call: abnormal results or if patient has not viewed results in their echart.  

## 2022-11-21 NOTE — Telephone Encounter (Signed)
Please inform patient: His chest x-ray did not show evidence of pneumonia. I have called in azithromycin for him to start a soon as possible for bronchitis.  If symptoms do not improve within 1 week, or worsen I would recommend he be seen as soon as possible for further evaluation.

## 2022-11-27 ENCOUNTER — Ambulatory Visit: Payer: Federal, State, Local not specified - PPO | Admitting: Family Medicine

## 2022-11-27 ENCOUNTER — Encounter: Payer: Self-pay | Admitting: Family Medicine

## 2022-11-27 VITALS — BP 133/77 | HR 66 | Temp 97.7°F | Wt 355.2 lb

## 2022-11-27 DIAGNOSIS — I700 Atherosclerosis of aorta: Secondary | ICD-10-CM

## 2022-11-27 DIAGNOSIS — T466X5A Adverse effect of antihyperlipidemic and antiarteriosclerotic drugs, initial encounter: Secondary | ICD-10-CM

## 2022-11-27 DIAGNOSIS — G8929 Other chronic pain: Secondary | ICD-10-CM

## 2022-11-27 DIAGNOSIS — G4733 Obstructive sleep apnea (adult) (pediatric): Secondary | ICD-10-CM | POA: Diagnosis not present

## 2022-11-27 DIAGNOSIS — E785 Hyperlipidemia, unspecified: Secondary | ICD-10-CM | POA: Diagnosis not present

## 2022-11-27 DIAGNOSIS — Z7984 Long term (current) use of oral hypoglycemic drugs: Secondary | ICD-10-CM

## 2022-11-27 DIAGNOSIS — J4530 Mild persistent asthma, uncomplicated: Secondary | ICD-10-CM

## 2022-11-27 DIAGNOSIS — M546 Pain in thoracic spine: Secondary | ICD-10-CM

## 2022-11-27 DIAGNOSIS — E1169 Type 2 diabetes mellitus with other specified complication: Secondary | ICD-10-CM | POA: Diagnosis not present

## 2022-11-27 DIAGNOSIS — I10 Essential (primary) hypertension: Secondary | ICD-10-CM

## 2022-11-27 DIAGNOSIS — Z789 Other specified health status: Secondary | ICD-10-CM

## 2022-11-27 DIAGNOSIS — G720 Drug-induced myopathy: Secondary | ICD-10-CM

## 2022-11-27 LAB — POCT GLYCOSYLATED HEMOGLOBIN (HGB A1C)
HbA1c POC (<> result, manual entry): 7.1 % (ref 4.0–5.6)
HbA1c, POC (controlled diabetic range): 7.1 % — AB (ref 0.0–7.0)
HbA1c, POC (prediabetic range): 7.1 % — AB (ref 5.7–6.4)
Hemoglobin A1C: 7.1 % — AB (ref 4.0–5.6)

## 2022-11-27 LAB — MICROALBUMIN / CREATININE URINE RATIO
Creatinine,U: 34.7 mg/dL
Microalb Creat Ratio: 2 mg/g (ref 0.0–30.0)
Microalb, Ur: <0.7 mg/dL (ref 0.0–1.9)

## 2022-11-27 MED ORDER — CYCLOBENZAPRINE HCL 10 MG PO TABS
ORAL_TABLET | ORAL | 1 refills | Status: DC
Start: 2022-11-27 — End: 2023-05-13

## 2022-11-27 MED ORDER — DAPAGLIFLOZIN PROPANEDIOL 10 MG PO TABS: 10.0000 mg | ORAL_TABLET | Freq: Every day | ORAL | 1 refills | Status: DC

## 2022-11-27 MED ORDER — SPIRONOLACTONE 25 MG PO TABS: 25.0000 mg | ORAL_TABLET | Freq: Every day | ORAL | 1 refills | Status: DC

## 2022-11-27 MED ORDER — LOSARTAN POTASSIUM 100 MG PO TABS: 100.0000 mg | ORAL_TABLET | Freq: Every day | ORAL | 1 refills | Status: DC

## 2022-11-27 MED ORDER — METFORMIN HCL 1000 MG PO TABS
ORAL_TABLET | ORAL | 1 refills | Status: DC
Start: 2022-11-27 — End: 2023-05-13

## 2022-11-27 MED ORDER — RIVAROXABAN 10 MG PO TABS: 10.0000 mg | ORAL_TABLET | Freq: Every day | ORAL | 1 refills | Status: DC

## 2022-11-27 MED ORDER — AMLODIPINE BESYLATE 10 MG PO TABS: 10.0000 mg | ORAL_TABLET | Freq: Every day | ORAL | 1 refills | Status: DC

## 2022-11-27 MED ORDER — EZETIMIBE 10 MG PO TABS: 10.0000 mg | ORAL_TABLET | Freq: Every day | ORAL | 3 refills | Status: DC

## 2022-11-27 NOTE — Patient Instructions (Signed)
No follow-ups on file.        Great to see you today.  I have refilled the medication(s) we provide.   If labs were collected, we will inform you of lab results once received either by echart message or telephone call.   - echart message- for normal results that have been seen by the patient already.   - telephone call: abnormal results or if patient has not viewed results in their echart.   If you are interested in weight loss counseling please make appt to discuss and bring with you 2 weeks of a food diary/log on notebook paper.  Food log is mandatory on first appt to proceed with counseling.  Weight loss counseling encompasses diet, exercise and can include  medications when appropriate and affordable.  There are routine appts for check-ins and weights to track progress and keep you on track.  Routine check-ins (in person) are also mandatory to continue with prescription refills. Check-in timeline  can range from 4 weeks to 12 weeks, depending on physician's recommendations and which step you are in of your weight loss journey.  Your BMI today is Body mass index is 48.17 kg/m.  Please check with your insurance prior to appt and ask them if they cover weight loss medications for your BMI? And if so, which medications. They may tell you some of the diabetes meds that are used for weight loss also,  are on your formulary- but this does not mean they are covered for weight loss only.  Even if they tell with a prior auth it is covered- make sure they check to see if you personally meet criteria with your BMI.

## 2022-11-27 NOTE — Progress Notes (Signed)
Patient ID: Adrian [name], male  DOB: [birth year], 62 y.o.   MRN: [id number] Patient Care Team    Relationship Specialty Notifications Start End  Natalia Leatherwood, DO PCP - General Family Medicine  02/06/18   Chilton Si, MD PCP - Cardiology Cardiology  09/22/21   Nyoka Cowden, MD Consulting Physician Pulmonary Disease  02/07/18   Jill Side, OD Referring Physician   02/07/18   Shellia Cleverly, DO Consulting Physician Gastroenterology  01/20/19     Chief Complaint  Patient presents with   Diabetes    [facility]mc; pt not fasting    Subjective: Adrian [name] is a 62 y.o. male present for Chronic Conditions/illness Management All past medical history, surgical history, allergies, family history, immunizations, medications and social history were updated in the electronic medical record today. All recent labs, ED visits and hospitalizations within the last year were reviewed.  Essential hypertension/hyperlipidemia/morbid obesity/CKD3 Pt reports compliance  with losartan 100 mg QD, amlodipine 5 mg and HCTZ 25 mg daily. Patient denies chest pain, shortness of breath, dizziness or lower extremity edema.  Pt is not taking a daily baby ASA. Pt is not prescribed statin 2/2 intolerance x2. Compliance with zeita and repatha tolerating. Labs due next appointment Diet: low Salt Exercise: Routine exercise RF: Hypertension, hyperlipidemia, obesity, diabetes.    Type 2 diabetes mellitus without complication, without long-term current use of insulin (HCC) Patient reports compliance with metformin 1000 mg daily daily and farxiga 10 mg qd.  Patient denies dizziness, hyperglycemic or hypoglycemic events. Patient denies numbness, tingling in the extremities or nonhealing wounds of feet.   He states he has cut back on his sugar consumption.  He is prescribed gabapentin  300 mg nightly and reports it is very helpful-mostly for back/leg pain. Diabetes diagnosed 2019  Asthma: Patient reports his  asthma has been rather stable. Not needing albuterol breakthrough.  Compliant with Pulmicort and Singulair nightly. He is now taking a daily antihistamine.       06/12/2022    9:32 AM 06/08/2021    9:51 AM 05/17/2021    8:57 AM 07/06/2020    1:56 PM 04/07/2020    6:21 PM  Depression screen PHQ 2/9  Decreased Interest 0 0 0 0 0  Down, Depressed, Hopeless 0 0 0 0 0  PHQ - 2 Score 0 0 0 0 0       No data to display               04/07/2020    6:21 PM 08/19/2018   12:00 PM 04/23/2018    6:06 PM 02/06/2018    8:43 AM  Fall Risk   Falls in the past year? 0 0 No No  Number falls in past yr: 0     Injury with Fall? 0     Follow up Falls evaluation completed       Immunization History  Administered Date(s) Administered   Influenza Split 05/03/2009, 05/19/2010, 04/27/2011, 05/02/2012, 05/18/2013   Influenza Whole 04/22/2017   Influenza,inj,Quad PF,6+ Mos 05/20/2015, 05/17/2016, 04/24/2017, 05/12/2018, 05/15/2019, 04/06/2020, 06/08/2021, 06/12/2022   Influenza,inj,quad, With Preservative 05/12/2014, 05/20/2015   Moderna Sars-Covid-2 Vaccination 11/23/2019, 12/28/2019, 07/30/2020   Pneumococcal Conjugate-13 05/12/2018   Pneumococcal Polysaccharide-23 02/02/2017   Td 07/24/2003   Tdap 11/13/2010, 06/04/2019   Zoster Recombinat (Shingrix) 06/04/2019, 10/27/2019   Past Medical History:  Diagnosis Date   Adenomatous colon polyp    FS 02/2004; colo 2005 hyperplastic polyp; 2010 neg - butmucosal prolapse.  Allergy    Asthma    Atypical chest pain 05/19/2021   Blood in stool    CAD in native artery 07/28/2021   Chicken pox    Colon polyps    Diabetes mellitus without complication (HCC) 01/2017   Diverticulosis    Frequent headaches    Genital warts    GERD (gastroesophageal reflux disease)    Hemorrhoid    Hepatic steatosis 04/2019   Elevated liver enzymes-full work-up for infectious disease process with ultrasound normal with the exception of hepatic steatosis    Hyperlipidemia    Hypertension    Hypogonadism in male    labs x2: 11/14/2015 - 216 and 11/17/2015 193; declined med at that time.    Insomnia    OSA on CPAP 09/18/2016   HST 09/18/2016; ESS-4; AHI-76; O2 min- 61%   Pulmonary embolism (HCC) 01/2017   provoked by long care ride, on xarelto, est Dr. Sherene Sires   Thyroid disease    subclinical hypothyroidism per records   Allergies  Allergen Reactions   Atorvastatin     myalgias   Crestor [Rosuvastatin]      severe foot pains   Lisinopril Cough   Statins Other (See Comments)    Causes pain/ foot trouble   Past Surgical History:  Procedure Laterality Date   ANKLE SURGERY     COLONOSCOPY  2016   dr Matthias Hughs    COLONOSCOPY WITH PROPOFOL N/A 01/03/2022   Procedure: COLONOSCOPY WITH PROPOFOL;  Surgeon: Shellia Cleverly, DO;  Location: [facility];  Service: Gastroenterology;  Laterality: N/A;   ESOPHAGOGASTRODUODENOSCOPY     with dr Matthias Hughs    POLYPECTOMY  01/03/2022   Procedure: POLYPECTOMY;  Surgeon: Shellia Cleverly, DO;  Location: [facility];  Service: Gastroenterology;;   Family History  Problem Relation Age of Onset   Asthma Mother    Arthritis Mother    Asthma Sister    Asthma Brother    Allergies Brother    Alcohol abuse Maternal Grandmother    Cancer Maternal Grandmother    Hiatal hernia Maternal Grandfather    Colon cancer Neg Hx    Esophageal cancer Neg Hx    Social History   Social History Narrative   Marital status/children/pets: Married.   Education/employment: Associates degree, employed as a Tax adviser:      -Wears a bicycle helmet riding a bike: No     -smoke alarm in the home:Yes     - wears seatbelt: Yes     - Feels safe in their relationships: Yes    Allergies as of 11/27/2022       Reactions   Atorvastatin    myalgias   Crestor [rosuvastatin]     severe foot pains   Lisinopril Cough   Statins Other (See Comments)   Causes pain/ foot trouble        Medication List         Accurate as of Nov 27, 2022 11:23 AM. If you have any questions, ask your nurse or doctor.          albuterol 108 (90 Base) MCG/ACT inhaler Commonly known as: VENTOLIN HFA Inhale 2 puffs into the lungs every 6 (six) hours as needed for wheezing or shortness of breath.   amLODipine 10 MG tablet Commonly known as: NORVASC Take 1 tablet (10 mg total) by mouth daily.   budesonide 180 MCG/ACT inhaler Commonly known as: PULMICORT Inhale 2 puffs into the lungs in the morning and at bedtime.  cetirizine 10 MG tablet Commonly known as: ZYRTEC Take 1 tablet (10 mg total) by mouth daily.   cyclobenzaprine 10 MG tablet Commonly known as: FLEXERIL TAKE 1 TABLET BY MOUTH EVERYDAY AT BEDTIME PRN   dapagliflozin propanediol 10 MG Tabs tablet Commonly known as: Farxiga Take 1 tablet (10 mg total) by mouth daily before breakfast.   ezetimibe 10 MG tablet Commonly known as: ZETIA Take 1 tablet (10 mg total) by mouth daily.   fluticasone 50 MCG/ACT nasal spray Commonly known as: FLONASE Place 2 sprays into both nostrils at bedtime.   gabapentin 300 MG capsule Commonly known as: NEURONTIN TAKE 1 CAPSULE BY MOUTH EVERYDAY AT BEDTIME   losartan 100 MG tablet Commonly known as: COZAAR Take 1 tablet (100 mg total) by mouth daily.   metFORMIN 1000 MG tablet Commonly known as: GLUCOPHAGE TAKE 1 TABLET (1,000 MG TOTAL) BY MOUTH TWICE A DAY WITH FOOD   montelukast 10 MG tablet Commonly known as: SINGULAIR Take 1 tablet (10 mg total) by mouth at bedtime.   Repatha 140 MG/ML Sosy Generic drug: Evolocumab Inject 1 mL into the skin every 14 (fourteen) days.   rivaroxaban 10 MG Tabs tablet Commonly known as: Xarelto Take 1 tablet (10 mg total) by mouth daily.   spironolactone 25 MG tablet Commonly known as: ALDACTONE Take 1 tablet (25 mg total) by mouth daily.   UNABLE TO FIND Med Name: CPAP       All past medical history, surgical history, allergies, family history,  immunizations andmedications were updated in the EMR today and reviewed under the history and medication portions of their EMR.      CT CORONARY MORPH W/CTA COR W/SCORE W/CA W/CM &/OR WO/CM Addendum Date: 05/30/2021   IMPRESSION: 1. Coronary calcium score of 163. This was 32 percentile for age and sex matched control. 2. Normal coronary origin with right dominance. 3. CAD-RADS 1. Minimal non-obstructive CAD (0-24%). Consider non-atherosclerotic causes of chest pain. Consider preventive therapy and risk factor modification. Thomasene Ripple, DO [facility] Electronically Signed   By: Thomasene Ripple D.O.   On: 05/30/2021 14:29   Result Date: 05/30/2021 IMPRESSION: 1.  Aortic Atherosclerosis (ICD10-I70.0). 2. Hepatic steatosis. Electronically Signed: By: Trudie Reed M.D. On: 05/30/2021 13:40    ROS: 14 pt review of systems performed and negative (unless mentioned in an HPI)  Objective: BP 133/77   Pulse 66   Temp 97.7 F (36.5 C)   Wt (!) 355 lb 3.2 oz (161.1 kg)   SpO2 95%   BMI 48.17 kg/m  Physical Exam Vitals and nursing note reviewed.  Constitutional:      General: He is not in acute distress.    Appearance: Normal appearance. He is not ill-appearing, toxic-appearing or diaphoretic.  HENT:     Head: Normocephalic and atraumatic.  Eyes:     General: No scleral icterus.       Right eye: No discharge.        Left eye: No discharge.     Extraocular Movements: Extraocular movements intact.     Pupils: Pupils are equal, round, and reactive to light.  Cardiovascular:     Rate and Rhythm: Normal rate and regular rhythm.     Heart sounds: No murmur heard. Pulmonary:     Effort: Pulmonary effort is normal. No respiratory distress.     Breath sounds: Normal breath sounds. No wheezing, rhonchi or rales.  Musculoskeletal:     Right lower leg: Edema (trace) present.     Left  lower leg: Edema (trace) present.  Skin:    General: Skin is warm.     Findings: No rash.  Neurological:     Mental  Status: He is alert and oriented to person, place, and time. Mental status is at baseline.  Psychiatric:        Mood and Affect: Mood normal.        Behavior: Behavior normal.        Thought Content: Thought content normal.        Judgment: Judgment normal.    Assessment/plan: TYREKE BASSINGER is a 62 y.o. male present for [facility] Elevated liver enzymes/Hepatic steatosis -Acute hepatitis and autoimmune hepatitis, HIV>negative -Abdominal ultrasound > hepatic steatosis.  -LFTs have been stable .   Type 2 diabetes mellitus with hyperlipidemia goal of less than 7. -continue metformin 1000 milligrams to twice daily dosing.  -continue farxiga 10 mg qd Could consider ozempic Statin intolerant > continue zeita and repatha Continue diet and exercise modifications  PNA series: Pneumonia series completed Flu shot: UTD(recommneded yearly) Microalbumin: UTD- collected 11/27/2022 Foot exam: Completed 06/12/2022 Eye exam: 08/2022 Dr. Gaylan Gerold him to make appt A1c: 6.6 >>5.8>>6.3>> 7.2>7.1> 8.9 >7.7> 6.8> 7.2> 6.5 > 7.1 A1c collected today  Mild persistent asthma without complication/Non-seasonal allergic rhinitis due to pollen Stable Continue Pulmicort 1-2 puffs BID.(fornualry changed from Asmanex) Continue Singulair 10 mg daily.  Continue  Zyrtec 10 mg nightly   Chronic thoracic back pain: stable Continue gabapentin nightly  Continue Flexeril 10 mg nightly as needed.     H/o PE- chronic anticoagulation- acquired thrombophilia(lifetime) Continue xarelto  Essential hypertension/morbid obesity/HLD< 70 ldl goal/Aortic atherosclerosis/statin intolerant/morbid obesity-Body mass index is 48.17 kg/m./Agatston coronary artery calcium score between 100 and 199 (163- 78%) stable - Goal :130/80 with is DM history.  Continue  losartan 100 mg daily. Continue spironolactone 25 mg Continue amlodipine 10 mg daily Continue Zetia 10 mg daily Continue  Repatha injections every 2 weeks.  Lipids  responded well. Low sodium diet Diet and exercise changes recommended   Return in about 24 weeks (around 05/14/2023) for Routine chronic condition follow-up.  If you are interested in weight loss counseling please make appt to discuss and bring with you 2 weeks of a food diary/log on notebook paper.  Food log is mandatory on first appt to proceed with counseling.  Weight loss counseling encompasses diet, exercise and can include  medications when appropriate and affordable.  There are routine appts for check-ins and weights to track progress and keep you on track.  Routine check-ins (in person) are also mandatory to continue with prescription refills. Check-in timeline  can range from 4 weeks to 12 weeks, depending on physician's recommendations and which step you are in of your weight loss journey.  Your BMI today is Body mass index is 48.17 kg/m.  Please check with your insurance prior to appt and ask them if they cover weight loss medications for your BMI? And if so, which medications. They may tell you some of the diabetes meds that are used for weight loss also,  are on your formulary- but this does not mean they are covered for weight loss only.  Even if they tell with a prior auth it is covered- make sure they check to see if you personally meet criteria with your BMI.  Orders Placed This Encounter  Procedures   Microalbumin / creatinine urine ratio   POCT glycosylated hemoglobin (Hb A1C)   Meds ordered this encounter  Medications   amLODipine (NORVASC)  10 MG tablet    Sig: Take 1 tablet (10 mg total) by mouth daily.    Dispense:  90 tablet    Refill:  1   cyclobenzaprine (FLEXERIL) 10 MG tablet    Sig: TAKE 1 TABLET BY MOUTH EVERYDAY AT BEDTIME PRN    Dispense:  90 tablet    Refill:  1   dapagliflozin propanediol (FARXIGA) 10 MG TABS tablet    Sig: Take 1 tablet (10 mg total) by mouth daily before breakfast.    Dispense:  90 tablet    Refill:  1   ezetimibe (ZETIA) 10 MG  tablet    Sig: Take 1 tablet (10 mg total) by mouth daily.    Dispense:  90 tablet    Refill:  3   losartan (COZAAR) 100 MG tablet    Sig: Take 1 tablet (100 mg total) by mouth daily.    Dispense:  90 tablet    Refill:  1   metFORMIN (GLUCOPHAGE) 1000 MG tablet    Sig: TAKE 1 TABLET (1,000 MG TOTAL) BY MOUTH TWICE A DAY WITH FOOD    Dispense:  180 tablet    Refill:  1   rivaroxaban (XARELTO) 10 MG TABS tablet    Sig: Take 1 tablet (10 mg total) by mouth daily.    Dispense:  90 tablet    Refill:  1   spironolactone (ALDACTONE) 25 MG tablet    Sig: Take 1 tablet (25 mg total) by mouth daily.    Dispense:  90 tablet    Refill:  1   Referral Orders  No referral(s) requested today    Note is dictated utilizing voice recognition software. Although note has been proof read prior to signing, occasional typographical errors still can be missed. If any questions arise, please do not hesitate to call for verification.  Electronically signed by: Felix Pacini, DO [facility]

## 2022-11-28 DIAGNOSIS — G4733 Obstructive sleep apnea (adult) (pediatric): Secondary | ICD-10-CM | POA: Diagnosis not present

## 2022-12-24 DIAGNOSIS — G4733 Obstructive sleep apnea (adult) (pediatric): Secondary | ICD-10-CM | POA: Diagnosis not present

## 2022-12-25 ENCOUNTER — Telehealth: Payer: Self-pay | Admitting: Family Medicine

## 2022-12-25 NOTE — Telephone Encounter (Signed)
No action needed until PA team has opportunity to work on this

## 2022-12-25 NOTE — Telephone Encounter (Signed)
Adrian [name] called and reports that he received a notice mentioning that his PA for Repatha is coming to an end. He has provided some information to help get the process started before it runs out to be proactive.   This is through [employer] and the phone number is [phone] fax number is [phone].  The site to download the form can be found at [email or web address] You can also visit the website at [email or web address]

## 2022-12-28 ENCOUNTER — Other Ambulatory Visit (HOSPITAL_COMMUNITY): Payer: Self-pay

## 2022-12-28 DIAGNOSIS — G4733 Obstructive sleep apnea (adult) (pediatric): Secondary | ICD-10-CM | POA: Diagnosis not present

## 2022-12-28 NOTE — Telephone Encounter (Signed)
Will started a renewal for new PA for pt he can still refill on 12/31/2022 we should have answer back before pt run out.

## 2022-12-28 NOTE — Telephone Encounter (Signed)
noted 

## 2022-12-29 DIAGNOSIS — G4733 Obstructive sleep apnea (adult) (pediatric): Secondary | ICD-10-CM | POA: Diagnosis not present

## 2022-12-31 ENCOUNTER — Other Ambulatory Visit (HOSPITAL_COMMUNITY): Payer: Self-pay

## 2022-12-31 NOTE — Telephone Encounter (Signed)
A Prior Authorization was initiated for this patients REPATHA 140MG  through CoverMyMeds. [facility]  Key: Germaine Pomfret)   Georga Bora [facility] Patient Advocate [phone]

## 2023-01-01 NOTE — Telephone Encounter (Signed)
noted 

## 2023-01-04 NOTE — Telephone Encounter (Signed)
New PA sent   [id number]

## 2023-01-04 NOTE — Telephone Encounter (Signed)
PA has been denied for repatha, please advise

## 2023-01-04 NOTE — Telephone Encounter (Signed)
Repatha should not be denied.  It has been approved in the past.  Circumstances have not changed .    If it is no longer on his formulary?  Then we will try the praulent.  Please advise.

## 2023-01-06 ENCOUNTER — Other Ambulatory Visit (HOSPITAL_COMMUNITY): Payer: Self-pay

## 2023-01-06 NOTE — Telephone Encounter (Signed)
Pharmacy Patient Advocate Encounter  Prior Authorization for Repatha 140MG /ML syringes has been APPROVED by [facility] from 12/05/2022 to 01/04/2024.   Copay is $74.97

## 2023-01-27 DIAGNOSIS — G4733 Obstructive sleep apnea (adult) (pediatric): Secondary | ICD-10-CM | POA: Diagnosis not present

## 2023-01-28 DIAGNOSIS — G4733 Obstructive sleep apnea (adult) (pediatric): Secondary | ICD-10-CM | POA: Diagnosis not present

## 2023-02-08 ENCOUNTER — Telehealth: Payer: Self-pay | Admitting: Family Medicine

## 2023-02-08 NOTE — Telephone Encounter (Signed)
Adrian [name] called requesting a letter in regards to his upcoming travels out of the country. He reports that the airline he is traveling with to [place] with are requiring a description of all his medication prescriptions along with the use of his Cpap and oxygen machine. Please contact the patient for any additional details or questions regarding this letter.

## 2023-02-08 NOTE — Telephone Encounter (Signed)
We can provide him with a list of the medications he is prescribed on letterhead.  He would need to contact the provider that manages his cpap concerning its use.

## 2023-02-11 NOTE — Telephone Encounter (Signed)
Spoke with patient regarding results/recommendations.   Letter emailed to pt

## 2023-02-14 ENCOUNTER — Other Ambulatory Visit: Payer: Self-pay | Admitting: Family Medicine

## 2023-03-22 ENCOUNTER — Ambulatory Visit: Payer: Federal, State, Local not specified - PPO | Admitting: Family Medicine

## 2023-03-22 VITALS — BP 126/84 | HR 66 | Temp 98.8°F | Resp 18 | Ht 72.0 in | Wt 347.2 lb

## 2023-03-22 DIAGNOSIS — Z1152 Encounter for screening for COVID-19: Secondary | ICD-10-CM

## 2023-03-22 DIAGNOSIS — U071 COVID-19: Secondary | ICD-10-CM

## 2023-03-22 LAB — POC COVID19 BINAXNOW: SARS Coronavirus 2 Ag: POSITIVE — AB

## 2023-03-22 MED ORDER — GUAIFENESIN-CODEINE 100-10 MG/5ML PO SYRP: 10.0000 mL | ORAL_SOLUTION | Freq: Three times a day (TID) | ORAL | 0 refills | Status: DC | PRN

## 2023-03-22 NOTE — Progress Notes (Signed)
   Subjective:    Patient ID: Adrian [name], male    DOB: 1961/05/26, 62 y.o.   MRN: [id number]  HPI URI- 'i've got phlegm in my chest and nasal passages'.  Sxs started over a week ago.  Sxs are not improving.  Not worsening.  No fever.  + fatigue.  Denies body aches.  No headache.  Denies facial pain/pressure.  Cough is productive- white sputum.  Pt has hx of asthma.  Just returned from [place] when this started.   Review of Systems For ROS see HPI     Objective:   Physical Exam Vitals reviewed.  Constitutional:      General: He is not in acute distress.    Appearance: Normal appearance. He is obese. He is not ill-appearing.  HENT:     Head: Normocephalic and atraumatic.     Right Ear: Tympanic membrane and ear canal normal.     Left Ear: Tympanic membrane and ear canal normal.     Nose: Congestion present. No rhinorrhea.     Comments: No TTP over frontal or maxillary sinuses Eyes:     Extraocular Movements: Extraocular movements intact.     Conjunctiva/sclera: Conjunctivae normal.  Cardiovascular:     Rate and Rhythm: Normal rate and regular rhythm.     Pulses: Normal pulses.  Pulmonary:     Effort: Pulmonary effort is normal. No respiratory distress.     Breath sounds: No wheezing or rhonchi.     Comments: Wet cough Musculoskeletal:     Cervical back: Neck supple. No rigidity.  Lymphadenopathy:     Cervical: No cervical adenopathy.  Skin:    General: Skin is warm and dry.  Neurological:     Mental Status: He is alert and oriented to person, place, and time.  Psychiatric:        Mood and Affect: Mood normal.        Behavior: Behavior normal.        Thought Content: Thought content normal.           Assessment & Plan:   COVID- new.  Pt's test was immediately +.  Since he has had sxs for over a week he is outside of the Paxlovid window.  Will start cough meds prn.  Reviewed supportive care and red flags that should prompt return.  Pt expressed understanding and is  in agreement w/ plan.

## 2023-03-22 NOTE — Patient Instructions (Signed)
Unfortunately you have COVID We are outside the window to treat w/ Paxlovid but we will treat the symptoms as they come Use the codeine cough syrup to help w/ sleep Try Robitussin or Delsym if you need cough relief w/o drowsiness Drink plenty of fluids REST! If you have worsening shortness of breath or wheezing- please let us know Call with any questions or concerns Hang in there!

## 2023-03-26 DIAGNOSIS — G4733 Obstructive sleep apnea (adult) (pediatric): Secondary | ICD-10-CM | POA: Diagnosis not present

## 2023-04-25 DIAGNOSIS — G4733 Obstructive sleep apnea (adult) (pediatric): Secondary | ICD-10-CM | POA: Diagnosis not present

## 2023-05-03 ENCOUNTER — Other Ambulatory Visit: Payer: Self-pay

## 2023-05-03 MED ORDER — AMLODIPINE BESYLATE 10 MG PO TABS: 10.0000 mg | ORAL_TABLET | Freq: Every day | ORAL | 0 refills | Status: DC

## 2023-05-14 ENCOUNTER — Encounter: Payer: Self-pay | Admitting: Family Medicine

## 2023-05-14 ENCOUNTER — Ambulatory Visit: Payer: Federal, State, Local not specified - PPO | Admitting: Family Medicine

## 2023-05-14 VITALS — BP 128/80 | HR 63 | Temp 98.3°F | Wt 350.6 lb

## 2023-05-14 DIAGNOSIS — I700 Atherosclerosis of aorta: Secondary | ICD-10-CM | POA: Diagnosis not present

## 2023-05-14 DIAGNOSIS — I10 Essential (primary) hypertension: Secondary | ICD-10-CM

## 2023-05-14 DIAGNOSIS — E1169 Type 2 diabetes mellitus with other specified complication: Secondary | ICD-10-CM | POA: Diagnosis not present

## 2023-05-14 DIAGNOSIS — E785 Hyperlipidemia, unspecified: Secondary | ICD-10-CM

## 2023-05-14 DIAGNOSIS — J4530 Mild persistent asthma, uncomplicated: Secondary | ICD-10-CM | POA: Diagnosis not present

## 2023-05-14 DIAGNOSIS — Z789 Other specified health status: Secondary | ICD-10-CM

## 2023-05-14 DIAGNOSIS — Z7901 Long term (current) use of anticoagulants: Secondary | ICD-10-CM

## 2023-05-14 DIAGNOSIS — M25552 Pain in left hip: Secondary | ICD-10-CM

## 2023-05-14 DIAGNOSIS — E782 Mixed hyperlipidemia: Secondary | ICD-10-CM

## 2023-05-14 DIAGNOSIS — I2510 Atherosclerotic heart disease of native coronary artery without angina pectoris: Secondary | ICD-10-CM

## 2023-05-14 DIAGNOSIS — G8929 Other chronic pain: Secondary | ICD-10-CM

## 2023-05-14 DIAGNOSIS — G720 Drug-induced myopathy: Secondary | ICD-10-CM

## 2023-05-14 DIAGNOSIS — M546 Pain in thoracic spine: Secondary | ICD-10-CM

## 2023-05-14 DIAGNOSIS — Z23 Encounter for immunization: Secondary | ICD-10-CM

## 2023-05-14 DIAGNOSIS — Z6841 Body Mass Index (BMI) 40.0 and over, adult: Secondary | ICD-10-CM

## 2023-05-14 DIAGNOSIS — E559 Vitamin D deficiency, unspecified: Secondary | ICD-10-CM

## 2023-05-14 LAB — COMPREHENSIVE METABOLIC PANEL
ALT: 74 U/L — ABNORMAL HIGH (ref 0–53)
AST: 45 U/L — ABNORMAL HIGH (ref 0–37)
Albumin: 4.3 g/dL (ref 3.5–5.2)
Alkaline Phosphatase: 59 U/L (ref 39–117)
BUN: 16 mg/dL (ref 6–23)
CO2: 29 meq/L (ref 19–32)
Calcium: 10.4 mg/dL (ref 8.4–10.5)
Chloride: 103 meq/L (ref 96–112)
Creatinine, Ser: 0.93 mg/dL (ref 0.40–1.50)
GFR: 88.56 mL/min (ref >60.00)
Glucose, Bld: 145 mg/dL — ABNORMAL HIGH (ref 70–99)
Potassium: 4.6 meq/L (ref 3.5–5.1)
Sodium: 139 meq/L (ref 135–145)
Total Bilirubin: 0.4 mg/dL (ref 0.2–1.2)
Total Protein: 7.3 g/dL (ref 6.0–8.3)

## 2023-05-14 LAB — HEMOGLOBIN A1C: Hgb A1c MFr Bld: 7.5 % — ABNORMAL HIGH (ref 4.6–6.5)

## 2023-05-14 MED ORDER — LOSARTAN POTASSIUM 100 MG PO TABS: 100.0000 mg | ORAL_TABLET | Freq: Every day | ORAL | 1 refills | Status: DC

## 2023-05-14 MED ORDER — CETIRIZINE HCL 10 MG PO TABS: 10.0000 mg | ORAL_TABLET | Freq: Every day | ORAL | 11 refills | Status: DC

## 2023-05-14 MED ORDER — EZETIMIBE 10 MG PO TABS: 10.0000 mg | ORAL_TABLET | Freq: Every day | ORAL | 3 refills | Status: DC

## 2023-05-14 MED ORDER — AMLODIPINE BESYLATE 10 MG PO TABS: 10.0000 mg | ORAL_TABLET | Freq: Every day | ORAL | 0 refills | Status: DC

## 2023-05-14 MED ORDER — METFORMIN HCL 1000 MG PO TABS: ORAL_TABLET | ORAL | 1 refills | Status: DC

## 2023-05-14 MED ORDER — SPIRONOLACTONE 25 MG PO TABS: 25.0000 mg | ORAL_TABLET | Freq: Every day | ORAL | 1 refills | Status: DC

## 2023-05-14 MED ORDER — MONTELUKAST SODIUM 10 MG PO TABS: 10.0000 mg | ORAL_TABLET | Freq: Every day | ORAL | 3 refills | Status: DC

## 2023-05-14 MED ORDER — CYCLOBENZAPRINE HCL 10 MG PO TABS: ORAL_TABLET | ORAL | 1 refills | Status: DC

## 2023-05-14 MED ORDER — DAPAGLIFLOZIN PROPANEDIOL 10 MG PO TABS: 10.0000 mg | ORAL_TABLET | Freq: Every day | ORAL | 1 refills | Status: DC

## 2023-05-14 MED ORDER — GABAPENTIN 300 MG PO CAPS: ORAL_CAPSULE | ORAL | 2 refills | Status: DC

## 2023-05-14 MED ORDER — RIVAROXABAN 10 MG PO TABS: 10.0000 mg | ORAL_TABLET | Freq: Every day | ORAL | 1 refills | Status: DC

## 2023-05-14 MED ORDER — REPATHA 140 MG/ML ~~LOC~~ SOSY
1.0000 mL | PREFILLED_SYRINGE | SUBCUTANEOUS | 3 refills | Status: AC
Start: 2023-05-14 — End: ?

## 2023-05-14 NOTE — Progress Notes (Signed)
Patient ID: Adrian [name], male  DOB: Jan 19, 1961, 62 y.o.   MRN: [id number] Patient Care Team    Relationship Specialty Notifications Start End  Natalia Leatherwood, DO PCP - General Family Medicine  02/06/18   Chilton Si, MD PCP - Cardiology Cardiology  09/22/21   Nyoka Cowden, MD Consulting Physician Pulmonary Disease  02/07/18   Jill Side, OD Referring Physician   02/07/18   Shellia Cleverly, DO Consulting Physician Gastroenterology  01/20/19     Chief Complaint  Patient presents with   Diabetes    Also c/o hip pain over 1 year; would like different PT office    Subjective: Adrian [name] is a 62 y.o. male present for Chronic Conditions/illness Management All past medical history, surgical history, allergies, family history, immunizations, medications and social history were updated in the electronic medical record today. All recent labs, ED visits and hospitalizations within the last year were reviewed.  Essential hypertension/hyperlipidemia/morbid obesity/CKD3 Pt reports compliance  with losartan 100 mg QD, amlodipine 5 mg and HCTZ 25 mg daily. Patient denies chest pain, shortness of breath, dizziness or lower extremity edema.  Pt is not taking a daily baby ASA. Pt is not prescribed statin 2/2 intolerance x2. Compliance with zeita and repatha tolerating. Labs due next appointment Diet: low Salt Exercise: Routine exercise RF: Hypertension, hyperlipidemia, obesity, diabetes.    Type 2 diabetes mellitus without complication, without long-term current use of insulin (HCC) Patient reports compliance  with metformin 1000 mg daily daily and farxiga 10 mg qd.  Patient denies dizziness, hyperglycemic or hypoglycemic events. Patient denies numbness, tingling in the extremities or nonhealing wounds of feet.  He states he has cut back on his sugar consumption.  He is prescribed gabapentin  300 mg nightly and reports it is very helpful-mostly for back/leg pain. Diabetes  diagnosed 2019  Asthma: Patient reports his asthma has been rather stable. Not needing albuterol breakthrough.  compliant with Pulmicort and Singulair nightly. He is now taking a daily antihistamine.       03/22/2023   11:24 AM 06/12/2022    9:32 AM 06/08/2021    9:51 AM 05/17/2021    8:57 AM 07/06/2020    1:56 PM  Depression screen PHQ 2/9  Decreased Interest 0 0 0 0 0  Down, Depressed, Hopeless 0 0 0 0 0  PHQ - 2 Score 0 0 0 0 0  Altered sleeping 0      Tired, decreased energy 0      Change in appetite 0      Feeling bad or failure about yourself  0      Trouble concentrating 0      Moving slowly or fidgety/restless 0      Suicidal thoughts 0      PHQ-9 Score 0      Difficult doing work/chores Not difficult at all          03/22/2023   11:24 AM  GAD 7 : Generalized Anxiety Score  Nervous, Anxious, on Edge 0  Control/stop worrying 0  Worry too much - different things 0  Trouble relaxing 0  Restless 0  Easily annoyed or irritable 0  Afraid - awful might happen 0  Total GAD 7 Score 0  Anxiety Difficulty Not difficult at all         03/22/2023   11:24 AM 04/07/2020    6:21 PM 08/19/2018   12:00 PM 04/23/2018    6:06 PM 02/06/2018  8:43 AM  Fall Risk   Falls in the past year? 0 0 0 No No  Number falls in past yr: 0 0     Injury with Fall? 0 0     Risk for fall due to : No Fall Risks      Follow up Falls evaluation completed Falls evaluation completed       Immunization History  Administered Date(s) Administered   Influenza Split 05/03/2009, 05/19/2010, 04/27/2011, 05/02/2012, 05/18/2013   Influenza Whole 04/22/2017   Influenza, Seasonal, Injecte, Preservative Fre 05/14/2023   Influenza,inj,Quad PF,6+ Mos 05/20/2015, 05/17/2016, 04/24/2017, 05/12/2018, 05/15/2019, 04/06/2020, 06/08/2021, 06/12/2022   Influenza,inj,quad, With Preservative 05/12/2014, 05/20/2015   Moderna Sars-Covid-2 Vaccination 11/23/2019, 12/28/2019, 07/30/2020   Pneumococcal Conjugate-13  05/12/2018   Pneumococcal Polysaccharide-23 02/02/2017   Td 07/24/2003   Tdap 11/13/2010, 06/04/2019   Zoster Recombinant(Shingrix) 06/04/2019, 10/27/2019   Past Medical History:  Diagnosis Date   Adenomatous colon polyp    FS 02/2004; colo 2005 hyperplastic polyp; 2010 neg - butmucosal prolapse.    Allergy    Asthma    Atypical chest pain 05/19/2021   Blood in stool    CAD in native artery 07/28/2021   Chicken pox    Colon polyps    Diabetes mellitus without complication (HCC) 01/2017   Diverticulosis    Frequent headaches    Genital warts    GERD (gastroesophageal reflux disease)    Hemorrhoid    Hepatic steatosis 04/2019   Elevated liver enzymes-full work-up for infectious disease process with ultrasound normal with the exception of hepatic steatosis   Hyperlipidemia    Hypertension    Hypogonadism in male    labs x2: 11/14/2015 - 216 and 11/17/2015 193; declined med at that time.    Insomnia    OSA on CPAP 09/18/2016   HST 09/18/2016; ESS-4; AHI-76; O2 min- 61%   Pulmonary embolism (HCC) 01/2017   provoked by long care ride, on xarelto, est Dr. Sherene Sires   Thyroid disease    subclinical hypothyroidism per records   Allergies  Allergen Reactions   Atorvastatin     myalgias   Crestor [Rosuvastatin]      severe foot pains   Lisinopril Cough   Statins Other (See Comments)    Causes pain/ foot trouble   Past Surgical History:  Procedure Laterality Date   ANKLE SURGERY     COLONOSCOPY  2016   dr Matthias Hughs    COLONOSCOPY WITH PROPOFOL N/A 01/03/2022   Procedure: COLONOSCOPY WITH PROPOFOL;  Surgeon: Shellia Cleverly, DO;  Location: [facility];  Service: Gastroenterology;  Laterality: N/A;   ESOPHAGOGASTRODUODENOSCOPY     with dr Matthias Hughs    POLYPECTOMY  01/03/2022   Procedure: POLYPECTOMY;  Surgeon: Shellia Cleverly, DO;  Location: [facility];  Service: Gastroenterology;;   Family History  Problem Relation Age of Onset   Asthma Mother    Arthritis Mother    Asthma  Sister    Asthma Brother    Allergies Brother    Alcohol abuse Maternal Grandmother    Cancer Maternal Grandmother    Hiatal hernia Maternal Grandfather    Colon cancer Neg Hx    Esophageal cancer Neg Hx    Social History   Social History Narrative   Marital status/children/pets: Married.   Education/employment: Associates degree, employed as a Tax adviser:      -Wears a bicycle helmet riding a bike: No     -smoke alarm in the home:Yes     -  wears seatbelt: Yes     - Feels safe in their relationships: Yes    Allergies as of 05/14/2023       Reactions   Atorvastatin    myalgias   Crestor [rosuvastatin]     severe foot pains   Lisinopril Cough   Statins Other (See Comments)   Causes pain/ foot trouble        Medication List        Accurate as of May 14, 2023 11:01 AM. If you have any questions, ask your nurse or doctor.          STOP taking these medications    guaiFENesin-codeine 100-10 MG/5ML syrup Commonly known as: ROBITUSSIN AC Stopped by: Felix Pacini       TAKE these medications    albuterol 108 (90 Base) MCG/ACT inhaler Commonly known as: VENTOLIN HFA TAKE 2 PUFFS BY MOUTH EVERY 6 HOURS AS NEEDED FOR WHEEZE OR SHORTNESS OF BREATH   amLODipine 10 MG tablet Commonly known as: NORVASC Take 1 tablet (10 mg total) by mouth daily.   budesonide 180 MCG/ACT inhaler Commonly known as: PULMICORT Inhale 2 puffs into the lungs in the morning and at bedtime.   cetirizine 10 MG tablet Commonly known as: ZYRTEC Take 1 tablet (10 mg total) by mouth daily.   cyclobenzaprine 10 MG tablet Commonly known as: FLEXERIL TAKE 1 TABLET BY MOUTH EVERYDAY AT BEDTIME PRN   dapagliflozin propanediol 10 MG Tabs tablet Commonly known as: Farxiga Take 1 tablet (10 mg total) by mouth daily before breakfast.   ezetimibe 10 MG tablet Commonly known as: ZETIA Take 1 tablet (10 mg total) by mouth daily.   fluticasone 50 MCG/ACT nasal  spray Commonly known as: FLONASE Place 2 sprays into both nostrils at bedtime.   gabapentin 300 MG capsule Commonly known as: NEURONTIN TAKE 1 CAPSULE BY MOUTH EVERYDAY AT BEDTIME   losartan 100 MG tablet Commonly known as: COZAAR Take 1 tablet (100 mg total) by mouth daily.   metFORMIN 1000 MG tablet Commonly known as: GLUCOPHAGE TAKE 1 TABLET (1,000 MG TOTAL) BY MOUTH TWICE A DAY WITH FOOD   montelukast 10 MG tablet Commonly known as: SINGULAIR Take 1 tablet (10 mg total) by mouth at bedtime.   Repatha 140 MG/ML Sosy Generic drug: Evolocumab Inject 140 mg into the skin every 14 (fourteen) days. What changed: how much to take Changed by: Felix Pacini   rivaroxaban 10 MG Tabs tablet Commonly known as: Xarelto Take 1 tablet (10 mg total) by mouth daily.   spironolactone 25 MG tablet Commonly known as: ALDACTONE Take 1 tablet (25 mg total) by mouth daily.   UNABLE TO FIND Med Name: CPAP       All past medical history, surgical history, allergies, family history, immunizations andmedications were updated in the EMR today and reviewed under the history and medication portions of their EMR.      CT CORONARY MORPH W/CTA COR W/SCORE W/CA W/CM &/OR WO/CM Addendum Date: 05/30/2021   IMPRESSION: 1. Coronary calcium score of 163. This was 3 percentile for age and sex matched control. 2. Normal coronary origin with right dominance. 3. CAD-RADS 1. Minimal non-obstructive CAD (0-24%). Consider non-atherosclerotic causes of chest pain. Consider preventive therapy and risk factor modification. Thomasene Ripple, DO [facility] Electronically Signed   By: Thomasene Ripple D.O.   On: 05/30/2021 14:29   Result Date: 05/30/2021 IMPRESSION: 1.  Aortic Atherosclerosis (ICD10-I70.0). 2. Hepatic steatosis. Electronically Signed: By: Trudie Reed M.D. On: 05/30/2021  13:40    ROS: 14 pt review of systems performed and negative (unless mentioned in an HPI)  Objective: BP 128/80   Pulse 63   Temp 98.3  F (36.8 C)   Wt (!) 350 lb 9.6 oz (159 kg)   SpO2 96%   BMI 47.55 kg/m  Physical Exam Vitals and nursing note reviewed.  Constitutional:      General: He is not in acute distress.    Appearance: Normal appearance. He is obese. He is not ill-appearing, toxic-appearing or diaphoretic.  HENT:     Head: Normocephalic and atraumatic.  Eyes:     General: No scleral icterus.       Right eye: No discharge.        Left eye: No discharge.     Extraocular Movements: Extraocular movements intact.     Pupils: Pupils are equal, round, and reactive to light.  Cardiovascular:     Rate and Rhythm: Normal rate and regular rhythm.     Heart sounds: No murmur heard. Pulmonary:     Effort: Pulmonary effort is normal. No respiratory distress.     Breath sounds: Normal breath sounds. No wheezing, rhonchi or rales.  Musculoskeletal:     Right lower leg: No edema.     Left lower leg: No edema.  Skin:    General: Skin is warm.     Findings: No rash.  Neurological:     Mental Status: He is alert and oriented to person, place, and time. Mental status is at baseline.  Psychiatric:        Mood and Affect: Mood normal.        Behavior: Behavior normal.        Thought Content: Thought content normal.        Judgment: Judgment normal.    Diabetic Foot Exam - Simple   Simple Foot Form Diabetic Foot exam was performed with the following findings: Yes 05/14/2023 10:43 AM  Visual Inspection No deformities, no ulcerations, no other skin breakdown bilaterally: Yes Sensation Testing Intact to touch and monofilament testing bilaterally: Yes Pulse Check Posterior Tibialis and Dorsalis pulse intact bilaterally: Yes Comments     Assessment/plan: Adrian [name] is a 62 y.o. male present for chronic condition management Elevated liver enzymes/Hepatic steatosis -Acute hepatitis and autoimmune hepatitis, HIV>negative -Abdominal ultrasound > hepatic steatosis.  -LFTs have been stable .   Type 2 diabetes  mellitus with hyperlipidemia goal of less than 7. -Continue metformin 1000 milligrams to twice daily dosing.  -Continue  farxiga 10 mg qd Could consider ozempic Statin myalgia> continue zeita and repatha Continue diet and exercise modifications  PNA series: Pneumonia series completed Flu shot: UTD completed today (recommneded yearly) Microalbumin: UTD- collected 11/27/2022 Foot exam: Completed 04/2023 Eye exam: 08/2022 Dr. Gaylan Gerold yearly visits A1c: 6.6 >>5.8>>6.3>> 7.2>7.1> 8.9 >7.7> 6.8> 7.2> 6.5 >7.2> 7.1 > collected A1c collected today  Mild persistent asthma without complication/Non-seasonal allergic rhinitis due to pollen Stable Continue Pulmicort 1-2 puffs BID.(fornualry changed from Asmanex) prn Continue Singulair 10 mg daily.  Continue Zyrtec 10 mg nightly   Chronic thoracic back pain: Stable Continue gabapentin nightly  Continue Flexeril 10 mg nightly as needed.     H/o PE- chronic anticoagulation- acquired thrombophilia(lifetime) Stable Continue xarelto  Hip pain - recurrent, worsening.  Last seen ortho 02/2022. Told OA He started biking, but now has pain hip to knee intermittently.   Essential hypertension/morbid obesity/HLD< 70 ldl goal/Aortic atherosclerosis/statin intolerant/morbid obesity-Body mass index is 47.55 kg/m./Agatston coronary artery  calcium score between 100 and 199 (163- 78%) stable - Goal :130/80 with is DM history.  Continue e losartan 100 mg daily. Continue spironolactone 25 mg Continue amlodipine 10 mg daily Continue Zetia 10 mg daily Continue Repatha injections every 2 weeks.  Statin myopathy Low sodium diet Diet and exercise changes recommended   Return in about 15 weeks (around 08/27/2023) for Routine chronic condition follow-up.    Orders Placed This Encounter  Procedures   Flu vaccine trivalent PF, 6mos and older(Flulaval,Afluria,Fluarix,Fluzone)   Comp Met (CMET)   Hemoglobin A1c   Ambulatory referral to Sports Medicine    Meds ordered this encounter  Medications   amLODipine (NORVASC) 10 MG tablet    Sig: Take 1 tablet (10 mg total) by mouth daily.    Dispense:  30 tablet    Refill:  0   cetirizine (ZYRTEC) 10 MG tablet    Sig: Take 1 tablet (10 mg total) by mouth daily.    Dispense:  30 tablet    Refill:  11   cyclobenzaprine (FLEXERIL) 10 MG tablet    Sig: TAKE 1 TABLET BY MOUTH EVERYDAY AT BEDTIME PRN    Dispense:  90 tablet    Refill:  1   dapagliflozin propanediol (FARXIGA) 10 MG TABS tablet    Sig: Take 1 tablet (10 mg total) by mouth daily before breakfast.    Dispense:  90 tablet    Refill:  1   Evolocumab (REPATHA) 140 MG/ML SOSY    Sig: Inject 140 mg into the skin every 14 (fourteen) days.    Dispense:  6 mL    Refill:  3   ezetimibe (ZETIA) 10 MG tablet    Sig: Take 1 tablet (10 mg total) by mouth daily.    Dispense:  90 tablet    Refill:  3   gabapentin (NEURONTIN) 300 MG capsule    Sig: TAKE 1 CAPSULE BY MOUTH EVERYDAY AT BEDTIME    Dispense:  90 capsule    Refill:  2    Please fill this script asap- pt had a script since Novemeber 2023 total 9 months and he is being told he has no refills.   losartan (COZAAR) 100 MG tablet    Sig: Take 1 tablet (100 mg total) by mouth daily.    Dispense:  90 tablet    Refill:  1   metFORMIN (GLUCOPHAGE) 1000 MG tablet    Sig: TAKE 1 TABLET (1,000 MG TOTAL) BY MOUTH TWICE A DAY WITH FOOD    Dispense:  180 tablet    Refill:  1   montelukast (SINGULAIR) 10 MG tablet    Sig: Take 1 tablet (10 mg total) by mouth at bedtime.    Dispense:  90 tablet    Refill:  3   rivaroxaban (XARELTO) 10 MG TABS tablet    Sig: Take 1 tablet (10 mg total) by mouth daily.    Dispense:  90 tablet    Refill:  1   spironolactone (ALDACTONE) 25 MG tablet    Sig: Take 1 tablet (25 mg total) by mouth daily.    Dispense:  90 tablet    Refill:  1   Referral Orders         Ambulatory referral to Sports Medicine      Note is dictated utilizing voice recognition  software. Although note has been proof read prior to signing, occasional typographical errors still can be missed. If any questions arise, please do not hesitate to call  for verification.  Electronically signed by: Felix Pacini, DO [facility] [place]

## 2023-05-14 NOTE — Patient Instructions (Signed)

## 2023-05-15 ENCOUNTER — Telehealth: Payer: Self-pay | Admitting: Family Medicine

## 2023-05-15 DIAGNOSIS — E1169 Type 2 diabetes mellitus with other specified complication: Secondary | ICD-10-CM

## 2023-05-15 DIAGNOSIS — Z6841 Body Mass Index (BMI) 40.0 and over, adult: Secondary | ICD-10-CM

## 2023-05-15 MED ORDER — MOUNJARO 2.5 MG/0.5ML ~~LOC~~ SOAJ: 2.5000 mg | SUBCUTANEOUS | 1 refills | Status: DC

## 2023-05-15 NOTE — Telephone Encounter (Signed)
Spoke with patient regarding results/recommendations.   Please complete PA if needed

## 2023-05-15 NOTE — Telephone Encounter (Signed)
Please call patient Adrian [name] electrolyte levels and kidney function is in normal range. Adrian [name] liver enzymes are mildly elevated-most likely from the "fatty liver disease " Adrian [name] A1c increased to 7.5  I have called in a new medication for him to start for Adrian [name] diabetes, that also hopefully will help with weight loss called Mounjaro.  It is a once weekly injection.  We will repeat Adrian [name] liver enzymes at Adrian [name] next follow-up appointment as well that is scheduled in February.

## 2023-05-17 ENCOUNTER — Telehealth: Payer: Self-pay

## 2023-05-17 ENCOUNTER — Other Ambulatory Visit (HOSPITAL_COMMUNITY): Payer: Self-pay

## 2023-05-17 NOTE — Telephone Encounter (Signed)
PA request has been Approved. New Encounter created for follow up. For additional info see Pharmacy Prior Auth telephone encounter from 05/17/23.

## 2023-05-17 NOTE — Telephone Encounter (Signed)
Pharmacy Patient Advocate Encounter   Received notification from Pt Calls Messages that prior authorization for Mounjaro 2.5MG /0.5ML auto-injectors is required/requested.   Insurance verification completed.   The patient is insured through [facility] .   Per test claim: APPROVED from 04/17/23 to 05/16/24. Ran test claim, Copay is $35. This test claim was processed through [facility]- copay amounts may vary at other pharmacies due to pharmacy/plan contracts, or as the patient moves through the different stages of their insurance plan.    [name] PA Case ID #: [id number]

## 2023-05-20 NOTE — Progress Notes (Signed)
Adrian [name] D.[name] [address] [place] [address] Phone: [phone]   Assessment and Plan:     1. Chronic left-sided low back pain with left-sided sciatica 2. Left hip pain 3. Primary osteoarthritis of left hip  -Chronic with exacerbation, initial sports medicine visit - Patient has been experiencing several months of left-sided low back pain radiating into the left leg down to left shin and calf.  Based on HPI, x-ray imaging, physical exam, I believe patient's pain is likely multifactorial including radicular symptoms from lumbar DDD as well as flare of pain from mild degenerative changes in left hip - Patient's flare of pain has overall improved since mild activity modification.  He wishes to proceed with conservative therapy at this time to see if pain continues to improve - Start Tylenol 500 to 1000 mg tablets 2-3 times a day for day-to-day pain relief - I do not recommend NSAID or prednisone use due to past medical history of anticoagulation on Xarelto, DM type II, hypertension - Start HEP for low back and hip  15 additional minutes spent for educating Therapeutic Home Exercise Program.  This included exercises focusing on stretching, strengthening, with focus on eccentric aspects.   Long term goals include an improvement in range of motion, strength, endurance as well as avoiding reinjury. Patient's frequency would include in 1-2 times a day, 3-5 times a week for a duration of 6-12 weeks. Proper technique shown and discussed handout in great detail with ATC.  All questions were discussed and answered.    Pertinent previous records reviewed include hip x-ray 02/01/2022, lumbar spine x-ray 02/01/2022, family medicine note 05/14/2023   Follow Up: 4 weeks for reevaluation to see if conservative therapy has been beneficial.  If pain has worsened, could consider intra-articular hip CSI versus advanced imaging of lumbar spine   Subjective:   I, Adrian [name]  Adrian [name], am serving as a Neurosurgeon for Doctor Adrian [name]  Chief Complaint: left hip and leg pain   HPI:   05/21/2023 Patient is a 62 year old male complaining of left hip and leg pain. Patient states that he has pain in his hip intermittently . Pain for a couple of months. Pain radiates down the leg mid calf. Decreased ROM. Antalgic gait. Last Thursday he wasn't able to drive he could not get comfortable . States it feel like a tooth (nerve) ache. Last year he wen to one session of PT . He did not return he started riding his bike and the pain resolved. Pain is greater at night, he isnt able to sleep through the night . 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 Tylenol for the pain and that helps take the pain off.   Relevant Historical Information: Hypertension, elevated BMI, DM type II, chronic anticoagulation on Xarelto  Additional pertinent review of systems negative.   Current Outpatient Medications:    albuterol (VENTOLIN HFA) 108 (90 Base) MCG/ACT inhaler, TAKE 2 PUFFS BY MOUTH EVERY 6 HOURS AS NEEDED FOR WHEEZE OR SHORTNESS OF BREATH, Disp: 8.5 each, Rfl: 2   amLODipine (NORVASC) 10 MG tablet, Take 1 tablet (10 mg total) by mouth daily., Disp: 30 tablet, Rfl: 0   budesonide (PULMICORT) 180 MCG/ACT inhaler, Inhale 2 puffs into the lungs in the morning and at bedtime., Disp: 1 each, Rfl: 11   cetirizine (ZYRTEC) 10 MG tablet, Take 1 tablet (10 mg total) by mouth daily., Disp: 30 tablet, Rfl: 11   cyclobenzaprine (FLEXERIL) 10 MG tablet,  TAKE 1 TABLET BY MOUTH EVERYDAY AT BEDTIME PRN, Disp: 90 tablet, Rfl: 1   dapagliflozin propanediol (FARXIGA) 10 MG TABS tablet, Take 1 tablet (10 mg total) by mouth daily before breakfast., Disp: 90 tablet, Rfl: 1   Evolocumab (REPATHA) 140 MG/ML SOSY, Inject 140 mg into the skin every 14 (fourteen) days., Disp: 6 mL, Rfl: 3   ezetimibe (ZETIA) 10 MG tablet, Take 1 tablet (10 mg total) by mouth daily., Disp: 90 tablet, Rfl: 3   fluticasone (FLONASE) 50  MCG/ACT nasal spray, Place 2 sprays into both nostrils at bedtime., Disp: , Rfl:    gabapentin (NEURONTIN) 300 MG capsule, TAKE 1 CAPSULE BY MOUTH EVERYDAY AT BEDTIME, Disp: 90 capsule, Rfl: 2   losartan (COZAAR) 100 MG tablet, Take 1 tablet (100 mg total) by mouth daily., Disp: 90 tablet, Rfl: 1   metFORMIN (GLUCOPHAGE) 1000 MG tablet, TAKE 1 TABLET (1,000 MG TOTAL) BY MOUTH TWICE A DAY WITH FOOD, Disp: 180 tablet, Rfl: 1   montelukast (SINGULAIR) 10 MG tablet, Take 1 tablet (10 mg total) by mouth at bedtime., Disp: 90 tablet, Rfl: 3   MOUNJARO 2.5 MG/0.5ML Pen, Inject 2.5 mg into the skin once a week., Disp: 6 mL, Rfl: 1   rivaroxaban (XARELTO) 10 MG TABS tablet, Take 1 tablet (10 mg total) by mouth daily., Disp: 90 tablet, Rfl: 1   spironolactone (ALDACTONE) 25 MG tablet, Take 1 tablet (25 mg total) by mouth daily., Disp: 90 tablet, Rfl: 1   UNABLE TO FIND, Med Name: CPAP, Disp: , Rfl:    Objective:     Vitals:   05/21/23 1356  Pulse: 64  SpO2: 97%  Weight: (!) 350 lb (158.8 kg)  Height: 6' (1.829 m)      Body mass index is 47.47 kg/m.    Physical Exam:    Gen: Appears well, nad, nontoxic and pleasant Psych: Alert and oriented, appropriate mood and affect Neuro: sensation intact, strength is 5/5 in upper and lower extremities, muscle tone wnl Skin: no susupicious lesions or rashes  Back - Normal skin, Spine with normal alignment and no deformity.   No tenderness to vertebral process palpation.   Left lumbar paraspinous muscles are mildly tender and without spasm NTTP gluteal musculature Straight leg raise negative for radicular symptoms, though reproduced hamstring tightness on left Trendelenberg positive left Piriformis Test negative for radicular symptoms, though restricted on left Gait normal    Left hip: No deformity, swelling or wasting ROM Flexion 90, ext 20, IR 35, ER 35   Negative log roll with FROM Positive FABER Positive FADIR     Electronically signed by:   Adrian [name] D.[name] 5:14 PM 05/21/23

## 2023-05-21 ENCOUNTER — Ambulatory Visit: Payer: Federal, State, Local not specified - PPO | Admitting: Sports Medicine

## 2023-05-21 VITALS — HR 64 | Ht 72.0 in | Wt 350.0 lb

## 2023-05-21 DIAGNOSIS — G8929 Other chronic pain: Secondary | ICD-10-CM

## 2023-05-21 DIAGNOSIS — M5442 Lumbago with sciatica, left side: Secondary | ICD-10-CM | POA: Diagnosis not present

## 2023-05-21 DIAGNOSIS — M25552 Pain in left hip: Secondary | ICD-10-CM

## 2023-05-21 DIAGNOSIS — M1612 Unilateral primary osteoarthritis, left hip: Secondary | ICD-10-CM | POA: Diagnosis not present

## 2023-05-21 NOTE — Patient Instructions (Signed)
Tylenol [phone] mg 2-3 times a day for pain relief  Low back and hip HEP  4 week follow up

## 2023-05-26 DIAGNOSIS — G4733 Obstructive sleep apnea (adult) (pediatric): Secondary | ICD-10-CM | POA: Diagnosis not present

## 2023-06-14 IMAGING — CT CT HEART MORP W/ CTA COR W/ SCORE W/ CA W/CM &/OR W/O CM
4 of 7 series · 8 of 20 positions shown, 9 images · IV contrast (APPLIED)
Comparison: None.
COMPARISON: None.

Addendum:
EXAM:
OVER-READ INTERPRETATION  CT CHEST

The following report is an over-read performed by radiologist Dr.
Autoapziura Arminas [REDACTED] on 05/30/2021. This
over-read does not include interpretation of cardiac or coronary
anatomy or pathology. The coronary calcium score/coronary CTA
interpretation by the cardiologist is attached.
CLINICAL DATA: This is a 60 year old male with chest pain. Medical
history includes Type 2 Diabetes mellitus, Hypertension and
hyperlipidemia.
Cardiac/Coronary  CTA
TECHNIQUE: The patient was scanned on a [employer] scanner.

[Series 6: ts diast sharp · axial · 0.39mm/px · z∈[+1363,+1398]mm · 2 of 261 slices shown]
[im 87/261  lung]
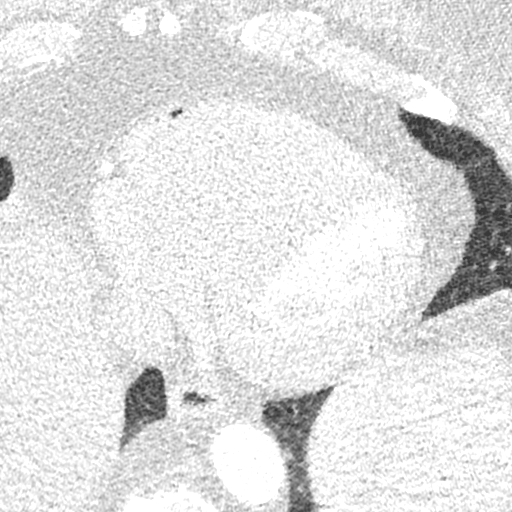
[im 174/261  lung]
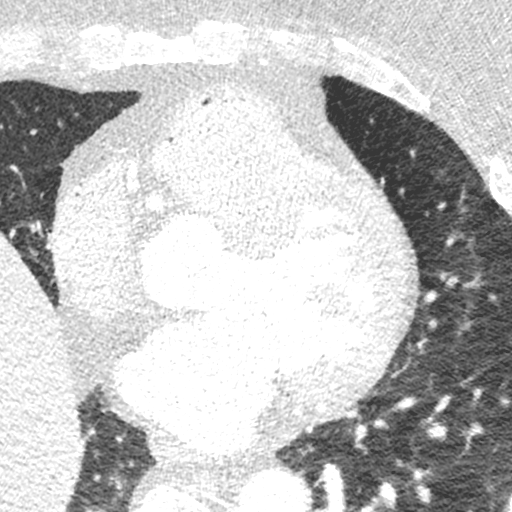

[Series 7: best diast · axial · 0.39mm/px · z∈[+1363,+1398]mm · 2 of 261 slices shown, 3 images]
[im 87/261  vessel]
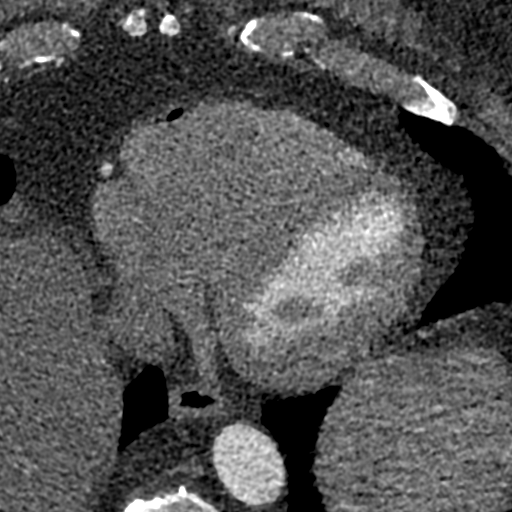
[im 87/261  lung]
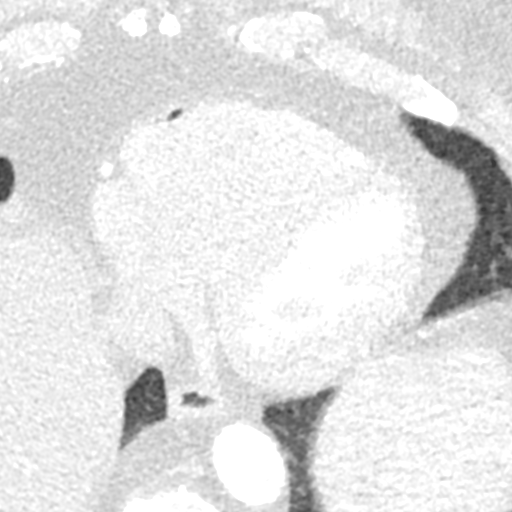
[im 174/261  vessel]
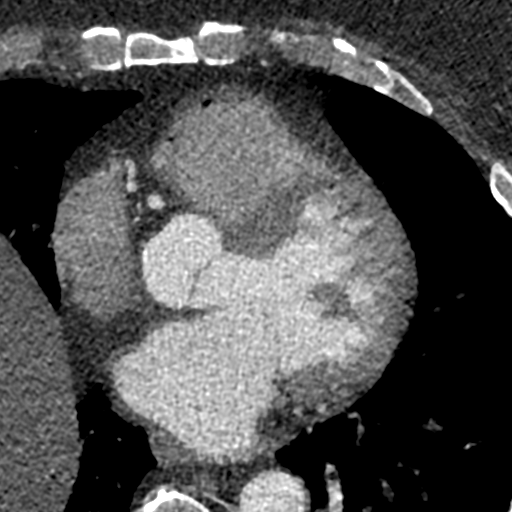

[Series 8: best syst · axial · 0.39mm/px · z∈[+1363,+1398]mm · 2 of 261 slices shown]
[im 87/261  vessel]
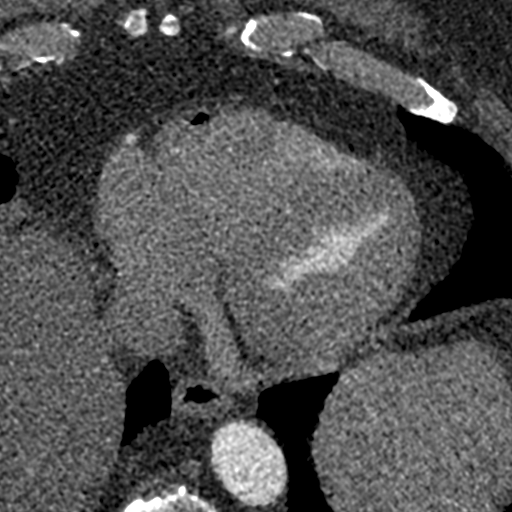
[im 174/261  vessel]
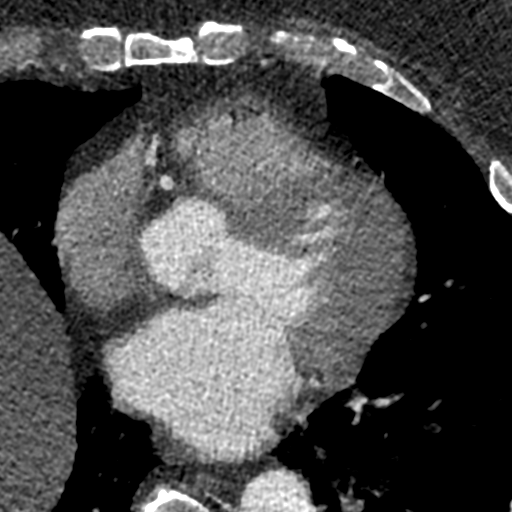

[Series 9: ts syst sharp · axial · 0.39mm/px · z∈[+1363,+1398]mm · 2 of 261 slices shown]
[im 87/261  lung]
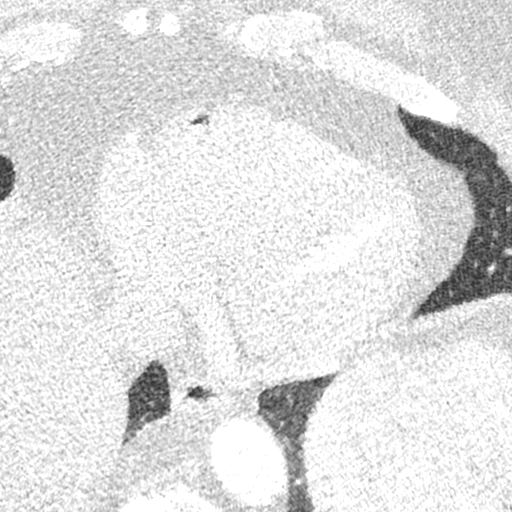
[im 174/261  lung]
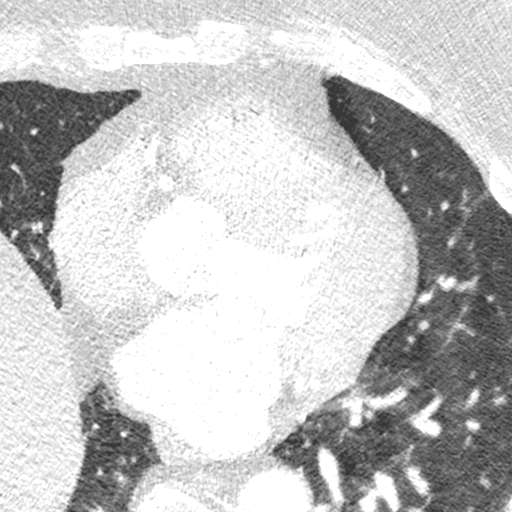

[8 of 20 positions shown; findings below may reference images not displayed]

FINDINGS: Atherosclerotic calcifications in the thoracic aorta. Elevation of
the right hemidiaphragm.

Within the visualized portions of the thorax there are no suspicious
appearing pulmonary nodules or masses, there is no acute
consolidative airspace disease, no pleural effusions, no
pneumothorax and no lymphadenopathy. Visualized portions of the
upper abdomen demonstrates diffuse low attenuation throughout the
visualized hepatic parenchyma, indicative of hepatic steatosis.
There are no aggressive appearing lytic or blastic lesions noted in
the visualized portions of the skeleton.
IMPRESSION: 1.  Aortic Atherosclerosis (R7HTI-K7Z.Z).
2. Hepatic steatosis.
FINDINGS: A 100 kV prospective scan was triggered in the descending thoracic
aorta at 111 HU's. Axial non-contrast 3 mm slices were carried out
through the heart. The data set was analyzed on a dedicated work
station and scored using the Agatson method. Gantry rotation speed
was 250 msecs and collimation was .6 mm. No beta blockade and 0.8 mg
of sl NTG was given. The 3D data set was reconstructed in 5%
intervals of the 67-82 % of the R-R cycle. Diastolic phases were
analyzed on a dedicated work station using MPR, MIP and VRT modes.
The patient received 80 cc of contrast.

Study Quality: Good.  There is misregistration artifact.

Aorta: Mildly dilated (38.6 mm). Minimal calcifications at the
aortic root. No dissection.

Aortic Valve:  Trileaflet.  No calcifications.

Coronary Arteries:  Normal coronary origin.  Right dominance.

RCA is a large dominant artery that gives rise to PDA and PLA. There
is no plaque.

Left main is a large artery that gives rise to LAD and LCX arteries.

LAD is a large vessel. There is a minimal (<24%) calcified plaque in
the proximal LAD. No plaques in the mid and distal LAD.

LCX is a non-dominant artery that gives rise to one large OM1
branch. There are diffuse minimal calcifications throughout the
proximal and mid LCX. Mid to distal LCX with noted stair-step
artifact. OM1 with no plaques.

Coronary Calcium Score:

Left main: 0

Left anterior descending artery:

Left circumflex artery:

Right coronary artery: 0

Total: 163

Percentile: 78

Other findings:

Normal pulmonary vein drainage into the left atrium.

Normal left atrial appendage without a thrombus.

Normal size of the pulmonary artery.
IMPRESSION: 1. Coronary calcium score of 163. This was 78 percentile for age and
sex matched control.

2. Normal coronary origin with right dominance.

3. CAD-RADS 1. Minimal non-obstructive CAD (0-24%). Consider
non-atherosclerotic causes of chest pain. Consider preventive
therapy and risk factor modification.

Haceli Hira, DO [REDACTED]

*** End of Addendum ***
EXAM:
OVER-READ INTERPRETATION  CT CHEST

The following report is an over-read performed by radiologist Dr.
Autoapziura Arminas [REDACTED] on 05/30/2021. This
over-read does not include interpretation of cardiac or coronary
anatomy or pathology. The coronary calcium score/coronary CTA
interpretation by the cardiologist is attached.
FINDINGS: Atherosclerotic calcifications in the thoracic aorta. Elevation of
the right hemidiaphragm.

Within the visualized portions of the thorax there are no suspicious
appearing pulmonary nodules or masses, there is no acute
consolidative airspace disease, no pleural effusions, no
pneumothorax and no lymphadenopathy. Visualized portions of the
upper abdomen demonstrates diffuse low attenuation throughout the
visualized hepatic parenchyma, indicative of hepatic steatosis.
There are no aggressive appearing lytic or blastic lesions noted in
the visualized portions of the skeleton.
IMPRESSION: 1.  Aortic Atherosclerosis (R7HTI-K7Z.Z).
2. Hepatic steatosis.

## 2023-06-14 NOTE — Progress Notes (Unsigned)
[name] [address] [place] [address] Phone: [phone]   Assessment and Plan:     There are no diagnoses linked to this encounter.  ***   Pertinent previous records reviewed include ***    Follow Up: ***     Subjective:   I, Adrian [name], am serving as a Neurosurgeon for Doctor Richardean Sale   Chief Complaint: left hip and leg pain    HPI:    05/21/2023 Patient is a 62 year old male complaining of left hip and leg pain. Patient states that he has pain in his hip intermittently . Pain for a couple of months. Pain radiates down the leg mid calf. Decreased ROM. Antalgic gait. Last Thursday he wasn't able to drive he could not get comfortable . States it feel like a tooth (nerve) ache. Last year he wen to one session of PT . He did not return he started riding his bike and the pain resolved. Pain is greater at night, he isnt able to sleep through the night . 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 Tylenol for the pain and that helps take the pain off.   06/18/2023 Patient states   Relevant Historical Information: Hypertension, elevated BMI, DM type II, chronic anticoagulation on Xarelto    Additional pertinent review of systems negative.   Current Outpatient Medications:    albuterol (VENTOLIN HFA) 108 (90 Base) MCG/ACT inhaler, TAKE 2 PUFFS BY MOUTH EVERY 6 HOURS AS NEEDED FOR WHEEZE OR SHORTNESS OF BREATH, Disp: 8.5 each, Rfl: 2   amLODipine (NORVASC) 10 MG tablet, Take 1 tablet (10 mg total) by mouth daily., Disp: 30 tablet, Rfl: 0   budesonide (PULMICORT) 180 MCG/ACT inhaler, Inhale 2 puffs into the lungs in the morning and at bedtime., Disp: 1 each, Rfl: 11   cetirizine (ZYRTEC) 10 MG tablet, Take 1 tablet (10 mg total) by mouth daily., Disp: 30 tablet, Rfl: 11   cyclobenzaprine (FLEXERIL) 10 MG tablet, TAKE 1 TABLET BY MOUTH EVERYDAY AT BEDTIME PRN, Disp: 90 tablet, Rfl: 1   dapagliflozin propanediol (FARXIGA)  10 MG TABS tablet, Take 1 tablet (10 mg total) by mouth daily before breakfast., Disp: 90 tablet, Rfl: 1   Evolocumab (REPATHA) 140 MG/ML SOSY, Inject 140 mg into the skin every 14 (fourteen) days., Disp: 6 mL, Rfl: 3   ezetimibe (ZETIA) 10 MG tablet, Take 1 tablet (10 mg total) by mouth daily., Disp: 90 tablet, Rfl: 3   fluticasone (FLONASE) 50 MCG/ACT nasal spray, Place 2 sprays into both nostrils at bedtime., Disp: , Rfl:    gabapentin (NEURONTIN) 300 MG capsule, TAKE 1 CAPSULE BY MOUTH EVERYDAY AT BEDTIME, Disp: 90 capsule, Rfl: 2   losartan (COZAAR) 100 MG tablet, Take 1 tablet (100 mg total) by mouth daily., Disp: 90 tablet, Rfl: 1   metFORMIN (GLUCOPHAGE) 1000 MG tablet, TAKE 1 TABLET (1,000 MG TOTAL) BY MOUTH TWICE A DAY WITH FOOD, Disp: 180 tablet, Rfl: 1   montelukast (SINGULAIR) 10 MG tablet, Take 1 tablet (10 mg total) by mouth at bedtime., Disp: 90 tablet, Rfl: 3   MOUNJARO 2.5 MG/0.5ML Pen, Inject 2.5 mg into the skin once a week., Disp: 6 mL, Rfl: 1   rivaroxaban (XARELTO) 10 MG TABS tablet, Take 1 tablet (10 mg total) by mouth daily., Disp: 90 tablet, Rfl: 1   spironolactone (ALDACTONE) 25 MG tablet, Take 1 tablet (25 mg total) by mouth daily., Disp: 90 tablet, Rfl: 1  UNABLE TO FIND, Med Name: CPAP, Disp: , Rfl:    Objective:     There were no vitals filed for this visit.    There is no height or weight on file to calculate BMI.    Physical Exam:    ***   Electronically signed by:  [name] 10:28 AM 06/14/23

## 2023-06-17 DIAGNOSIS — Z9981 Dependence on supplemental oxygen: Secondary | ICD-10-CM | POA: Diagnosis not present

## 2023-06-17 DIAGNOSIS — G4733 Obstructive sleep apnea (adult) (pediatric): Secondary | ICD-10-CM | POA: Diagnosis not present

## 2023-06-18 ENCOUNTER — Ambulatory Visit: Payer: Federal, State, Local not specified - PPO | Admitting: Sports Medicine

## 2023-06-18 ENCOUNTER — Other Ambulatory Visit: Payer: Self-pay

## 2023-06-18 VITALS — BP 118/74 | HR 70 | Ht 72.0 in | Wt 350.0 lb

## 2023-06-18 DIAGNOSIS — M1612 Unilateral primary osteoarthritis, left hip: Secondary | ICD-10-CM | POA: Diagnosis not present

## 2023-06-18 DIAGNOSIS — M25552 Pain in left hip: Secondary | ICD-10-CM

## 2023-06-18 NOTE — Patient Instructions (Addendum)
Injected in hip today See me again in 2 weeks

## 2023-06-24 DIAGNOSIS — G4733 Obstructive sleep apnea (adult) (pediatric): Secondary | ICD-10-CM | POA: Diagnosis not present

## 2023-06-25 ENCOUNTER — Encounter (HOSPITAL_BASED_OUTPATIENT_CLINIC_OR_DEPARTMENT_OTHER): Payer: Self-pay | Admitting: Cardiovascular Disease

## 2023-06-25 ENCOUNTER — Ambulatory Visit (HOSPITAL_BASED_OUTPATIENT_CLINIC_OR_DEPARTMENT_OTHER): Payer: Federal, State, Local not specified - PPO | Admitting: Cardiovascular Disease

## 2023-06-25 VITALS — BP 112/82 | HR 67 | Ht 73.0 in | Wt 346.8 lb

## 2023-06-25 DIAGNOSIS — I10 Essential (primary) hypertension: Secondary | ICD-10-CM | POA: Diagnosis not present

## 2023-06-25 DIAGNOSIS — I700 Atherosclerosis of aorta: Secondary | ICD-10-CM

## 2023-06-25 DIAGNOSIS — I2510 Atherosclerotic heart disease of native coronary artery without angina pectoris: Secondary | ICD-10-CM

## 2023-06-25 DIAGNOSIS — G4733 Obstructive sleep apnea (adult) (pediatric): Secondary | ICD-10-CM

## 2023-06-25 DIAGNOSIS — E1169 Type 2 diabetes mellitus with other specified complication: Secondary | ICD-10-CM

## 2023-06-25 DIAGNOSIS — I272 Pulmonary hypertension, unspecified: Secondary | ICD-10-CM

## 2023-06-25 DIAGNOSIS — E782 Mixed hyperlipidemia: Secondary | ICD-10-CM

## 2023-06-25 NOTE — Progress Notes (Signed)
Cardiology Office Note:  .   Date:  06/25/2023  ID:  Adrian [name], DOB 02/01/1961, MRN [id number] PCP: Natalia Leatherwood, DO  [facility] Providers Cardiologist:  Chilton Si, MD    History of Present Illness: .   Adrian [name] is a 62 y.o. male with a hx of coronary calcification, hyperlipidemia, hypertension, diabetes mellitus, GERD, asthma, OSA on CPAP, pulmonary embolism (2018), and hepatic steatosis here for follow-up. He was initially seen 05/19/2021 for the evaluation of high pulmonary arterial pressure. He had an echo in 07/2017 with LVEF 55-60%. Right ventricular function was normal, and PASP could not be calculated because there was no tricuspid regurgitation. Prior to that he had an echo 01/2017 with PASP 61 mmHg. He has a history of statin intolerance but does well with Repatha.      Adrian [name] reported ongoing exertional dyspnea for 20 years. He also noted constant chest pain/pressure, and full abdominal expansion while walking (pressure relieved with belching). At night he would use 2 L oxygen with his CPAP. A coronary CTA 05/2021 showed mildly dilated aorta (38.6 mm) with minimal calcifications at the aortic root. There was a minimal (<24%) calcified plaque in the proximal LAD, and diffuse minimal calcifications throughout the proximal and mid LCX. Coronary calcium score of 163. This was 78 percentile. He followed up with our pharmacist 06/07/21 and his BP was 148/92 on Losartan 100 mg, Amlodipine 5 mg, HCTZ 25 mg. He was advised to increase amlodipine to 10 mg, but he deferred until he could follow-up with his PCP the following day. At that visit with his PCP his BP was 128/76, no medication changes were made.   Adrian [name] was referred to the prep program at the [facility].  He followed up with Gillian Shields, NP on 09/2021 and his home blood pressures were ranging in the 130s to low 140s over 80s to 90s.  Amlodipine was increased to 10 mg.  Blood pressure is better controlled at his  03/2022 visit.  Adrian [name] has been managing well with his current regimen of amlodipine, losartan, and spironolactone.  He reports no adverse effects from his medications, including the Repatha injections.  Over the summer, the patient was active, engaging in regular biking and swimming.  However, with the onset of colder weather, his physical activity has decreased. He did, however, report a significant amount of walking during a recent three-week trip to [place], averaging between six to eight miles per day. He experienced one day of difficulty with the air during this trip, which he attributes to either the elevation or the dampness from heavy rain.  The patient denies any recent changes in his health status, including any swelling in his legs or feet. He reports feeling generally well, with no new or worsening symptoms. His blood pressure has been well-controlled, and he has not been monitoring it at home. He is due for a cholesterol check at his next appointment with his primary care provider.     ROS:  As per HPI  Studies Reviewed: Marland Kitchen   EKG Interpretation Date/Time:  Tuesday June 25 2023 08:52:49 EST Ventricular Rate:  67 PR Interval:  176 QRS Duration:  100 QT Interval:  400 QTC Calculation: 422 R Axis:   -21  Text Interpretation: Normal sinus rhythm Left axis deviation ECG OTHERWISE WITHIN NORMAL LIMITS Since last tracing Rate slower Confirmed by Chilton Si ([id number]) on 06/25/2023 8:57:54 AM     Risk Assessment/Calculations:  Physical Exam:   VS:  BP 112/82   Pulse 67   Ht 6\' 1"  (1.854 m)   Wt (!) 346 lb 12.8 oz (157.3 kg)   BMI 45.75 kg/m  , BMI Body mass index is 45.75 kg/m. GENERAL:  Well appearing HEENT: Pupils equal round and reactive, fundi not visualized, oral mucosa unremarkable NECK:  No jugular venous distention, waveform within normal limits, carotid upstroke brisk and symmetric, no bruits, no thyromegaly LUNGS:  Clear to auscultation  bilaterally HEART:  RRR.  PMI not displaced or sustained,S1 and S2 within normal limits, no S3, no S4, no clicks, no rubs, no murmurs ABD:  Flat, positive bowel sounds normal in frequency in pitch, no bruits, no rebound, no guarding, no midline pulsatile mass, no hepatomegaly, no splenomegaly EXT:  2 plus pulses throughout, no edema, no cyanosis no clubbing SKIN:  No rashes no nodules NEURO:  Cranial nerves II through XII grossly intact, motor grossly intact throughout PSYCH:  Cognitively intact, oriented to person place and time   ASSESSMENT AND PLAN: .    # Hypertension Well controlled on current regimen of amlodipine, losartan, and spironolactone. No reported symptoms of swelling in legs or feet. -Continue current medications.  # Hyperlipidemia On Zetia and Repatha. No reported issues with Repatha injections. -Check cholesterol levels at next primary care appointment. -Request primary care physician to forward results for review.  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 No reported issues. -Continue Xarelto as prescribed.  # General Health Maintenance Regular exercise reported, including biking and swimming in warmer months and walking during recent vacation. -Encourage continuation of regular exercise, aiming for 150 minutes per week, even during colder months.  Follow-up Annual visit, or sooner if any issues arise.      Signed, Chilton Si, MD

## 2023-06-25 NOTE — Patient Instructions (Signed)
Medication Instructions:  Your physician recommends that you continue on your current medications as directed. Please refer to the Current Medication list given to you today.  *If you need a refill on your cardiac medications before your next appointment, please call your pharmacy*  Lab Work: NONE  Testing/Procedures: NONE  Follow-Up: At [facility], you and your health needs are our priority.  As part of our continuing mission to provide you with exceptional heart care, we have created designated Provider Care Teams.  These Care Teams include your primary Cardiologist (physician) and Advanced Practice Providers (APPs -  Physician Assistants and Nurse Practitioners) who all work together to provide you with the care you need, when you need it.  We recommend signing up for the patient portal called "MyChart".  Sign up information is provided on this After Visit Summary.  MyChart is used to connect with patients for Virtual Visits (Telemedicine).  Patients are able to view lab/test results, encounter notes, upcoming appointments, etc.  Non-urgent messages can be sent to your provider as well.   To learn more about what you can do with MyChart, go to [email or web address].    Your next appointment:   12 month(s)  The format for your next appointment:   In Person  Provider:   Tiffany Helena, MD     

## 2023-06-28 DIAGNOSIS — G4733 Obstructive sleep apnea (adult) (pediatric): Secondary | ICD-10-CM | POA: Diagnosis not present

## 2023-07-01 NOTE — Progress Notes (Unsigned)
[name] [address] [place] [address] Phone: [phone]   Assessment and Plan:     There are no diagnoses linked to this encounter.  ***   Pertinent previous records reviewed include ***    Follow Up: ***     Subjective:   I, Revella Shelton, am serving as a Neurosurgeon for Doctor Richardean Sale   Chief Complaint: left hip and leg pain    HPI:    05/21/2023 Patient is a 62 year old male complaining of left hip and leg pain. Patient states that he has pain in his hip intermittently . Pain for a couple of months. Pain radiates down the leg mid calf. Decreased ROM. Antalgic gait. Last Thursday he wasn't able to drive he could not get comfortable . States it feel like a tooth (nerve) ache. Last year he wen to one session of PT . He did not return he started riding his bike and the pain resolved. Pain is greater at night, he isnt able to sleep through the night . 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 Tylenol for the pain and that helps take the pain off.    06/18/2023 Patient states that he has had no change since last visit. Would like injection today.   07/02/2023 Patient states   Relevant Historical Information: Hypertension, elevated BMI, DM type II, chronic anticoagulation on Xarelto  Additional pertinent review of systems negative.   Current Outpatient Medications:    albuterol (VENTOLIN HFA) 108 (90 Base) MCG/ACT inhaler, TAKE 2 PUFFS BY MOUTH EVERY 6 HOURS AS NEEDED FOR WHEEZE OR SHORTNESS OF BREATH, Disp: 8.5 each, Rfl: 2   amLODipine (NORVASC) 10 MG tablet, Take 1 tablet (10 mg total) by mouth daily., Disp: 30 tablet, Rfl: 0   budesonide (PULMICORT) 180 MCG/ACT inhaler, Inhale 2 puffs into the lungs in the morning and at bedtime., Disp: 1 each, Rfl: 11   cetirizine (ZYRTEC) 10 MG tablet, Take 1 tablet (10 mg total) by mouth daily., Disp: 30 tablet, Rfl: 11   cyclobenzaprine (FLEXERIL) 10 MG tablet, TAKE 1  TABLET BY MOUTH EVERYDAY AT BEDTIME PRN, Disp: 90 tablet, Rfl: 1   dapagliflozin propanediol (FARXIGA) 10 MG TABS tablet, Take 1 tablet (10 mg total) by mouth daily before breakfast., Disp: 90 tablet, Rfl: 1   Evolocumab (REPATHA) 140 MG/ML SOSY, Inject 140 mg into the skin every 14 (fourteen) days., Disp: 6 mL, Rfl: 3   ezetimibe (ZETIA) 10 MG tablet, Take 1 tablet (10 mg total) by mouth daily., Disp: 90 tablet, Rfl: 3   fluticasone (FLONASE) 50 MCG/ACT nasal spray, Place 2 sprays into both nostrils at bedtime., Disp: , Rfl:    gabapentin (NEURONTIN) 300 MG capsule, TAKE 1 CAPSULE BY MOUTH EVERYDAY AT BEDTIME, Disp: 90 capsule, Rfl: 2   losartan (COZAAR) 100 MG tablet, Take 1 tablet (100 mg total) by mouth daily., Disp: 90 tablet, Rfl: 1   metFORMIN (GLUCOPHAGE) 1000 MG tablet, TAKE 1 TABLET (1,000 MG TOTAL) BY MOUTH TWICE A DAY WITH FOOD, Disp: 180 tablet, Rfl: 1   montelukast (SINGULAIR) 10 MG tablet, Take 1 tablet (10 mg total) by mouth at bedtime., Disp: 90 tablet, Rfl: 3   MOUNJARO 2.5 MG/0.5ML Pen, Inject 2.5 mg into the skin once a week., Disp: 6 mL, Rfl: 1   rivaroxaban (XARELTO) 10 MG TABS tablet, Take 1 tablet (10 mg total) by mouth daily., Disp: 90 tablet, Rfl: 1   spironolactone (ALDACTONE) 25  MG tablet, Take 1 tablet (25 mg total) by mouth daily., Disp: 90 tablet, Rfl: 1   UNABLE TO FIND, Med Name: CPAP, Disp: , Rfl:    Objective:     There were no vitals filed for this visit.    There is no height or weight on file to calculate BMI.    Physical Exam:    ***   Electronically signed by:  [name] 12:20 PM 07/01/23

## 2023-07-02 ENCOUNTER — Ambulatory Visit: Payer: Federal, State, Local not specified - PPO | Admitting: Sports Medicine

## 2023-07-02 VITALS — HR 72 | Ht 73.0 in | Wt 346.0 lb

## 2023-07-02 DIAGNOSIS — M25552 Pain in left hip: Secondary | ICD-10-CM | POA: Diagnosis not present

## 2023-07-02 DIAGNOSIS — M1612 Unilateral primary osteoarthritis, left hip: Secondary | ICD-10-CM

## 2023-07-02 NOTE — Patient Instructions (Signed)
As needed follow up 

## 2023-07-25 ENCOUNTER — Other Ambulatory Visit: Payer: Self-pay | Admitting: Family Medicine

## 2023-07-25 DIAGNOSIS — G4733 Obstructive sleep apnea (adult) (pediatric): Secondary | ICD-10-CM | POA: Diagnosis not present

## 2023-07-29 DIAGNOSIS — G4733 Obstructive sleep apnea (adult) (pediatric): Secondary | ICD-10-CM | POA: Diagnosis not present

## 2023-08-25 DIAGNOSIS — G4733 Obstructive sleep apnea (adult) (pediatric): Secondary | ICD-10-CM | POA: Diagnosis not present

## 2023-08-27 ENCOUNTER — Other Ambulatory Visit: Payer: Self-pay

## 2023-08-27 ENCOUNTER — Ambulatory Visit: Payer: Federal, State, Local not specified - PPO | Admitting: Family Medicine

## 2023-08-29 DIAGNOSIS — G4733 Obstructive sleep apnea (adult) (pediatric): Secondary | ICD-10-CM | POA: Diagnosis not present

## 2023-09-13 ENCOUNTER — Other Ambulatory Visit: Payer: Self-pay | Admitting: Family Medicine

## 2023-09-13 DIAGNOSIS — E119 Type 2 diabetes mellitus without complications: Secondary | ICD-10-CM | POA: Diagnosis not present

## 2023-09-13 DIAGNOSIS — H524 Presbyopia: Secondary | ICD-10-CM | POA: Diagnosis not present

## 2023-09-13 DIAGNOSIS — Z7984 Long term (current) use of oral hypoglycemic drugs: Secondary | ICD-10-CM | POA: Diagnosis not present

## 2023-09-13 LAB — HM DIABETES EYE EXAM

## 2023-09-17 ENCOUNTER — Other Ambulatory Visit: Payer: Self-pay | Admitting: Family Medicine

## 2023-09-20 ENCOUNTER — Ambulatory Visit: Payer: Federal, State, Local not specified - PPO | Admitting: Family Medicine

## 2023-09-20 ENCOUNTER — Encounter: Payer: Self-pay | Admitting: Family Medicine

## 2023-09-20 VITALS — BP 132/80 | HR 70 | Temp 98.1°F | Wt 353.4 lb

## 2023-09-20 DIAGNOSIS — I700 Atherosclerosis of aorta: Secondary | ICD-10-CM

## 2023-09-20 DIAGNOSIS — G4733 Obstructive sleep apnea (adult) (pediatric): Secondary | ICD-10-CM | POA: Diagnosis not present

## 2023-09-20 DIAGNOSIS — J4530 Mild persistent asthma, uncomplicated: Secondary | ICD-10-CM | POA: Diagnosis not present

## 2023-09-20 DIAGNOSIS — E1169 Type 2 diabetes mellitus with other specified complication: Secondary | ICD-10-CM

## 2023-09-20 DIAGNOSIS — I10 Essential (primary) hypertension: Secondary | ICD-10-CM

## 2023-09-20 DIAGNOSIS — R109 Unspecified abdominal pain: Secondary | ICD-10-CM

## 2023-09-20 DIAGNOSIS — E782 Mixed hyperlipidemia: Secondary | ICD-10-CM | POA: Diagnosis not present

## 2023-09-20 DIAGNOSIS — E559 Vitamin D deficiency, unspecified: Secondary | ICD-10-CM | POA: Diagnosis not present

## 2023-09-20 DIAGNOSIS — G8929 Other chronic pain: Secondary | ICD-10-CM

## 2023-09-20 DIAGNOSIS — Z7901 Long term (current) use of anticoagulants: Secondary | ICD-10-CM

## 2023-09-20 DIAGNOSIS — R748 Abnormal levels of other serum enzymes: Secondary | ICD-10-CM

## 2023-09-20 DIAGNOSIS — M546 Pain in thoracic spine: Secondary | ICD-10-CM

## 2023-09-20 LAB — CBC
HCT: 47.3 % (ref 39.0–52.0)
Hemoglobin: 15.6 g/dL (ref 13.0–17.0)
MCHC: 33 g/dL (ref 30.0–36.0)
MCV: 90.3 fL (ref 78.0–100.0)
Platelets: 272 10*3/uL (ref 150.0–400.0)
RBC: 5.24 Mil/uL (ref 4.22–5.81)
RDW: 14.1 % (ref 11.5–15.5)
WBC: 9.3 10*3/uL (ref 4.0–10.5)

## 2023-09-20 LAB — COMPREHENSIVE METABOLIC PANEL
ALT: 63 U/L — ABNORMAL HIGH (ref 0–53)
AST: 46 U/L — ABNORMAL HIGH (ref 0–37)
Albumin: 4.4 g/dL (ref 3.5–5.2)
Alkaline Phosphatase: 58 U/L (ref 39–117)
BUN: 19 mg/dL (ref 6–23)
CO2: 28 meq/L (ref 19–32)
Calcium: 10.4 mg/dL (ref 8.4–10.5)
Chloride: 102 meq/L (ref 96–112)
Creatinine, Ser: 0.93 mg/dL (ref 0.40–1.50)
GFR: 88.34 mL/min (ref >60.00)
Glucose, Bld: 137 mg/dL — ABNORMAL HIGH (ref 70–99)
Potassium: 4.9 meq/L (ref 3.5–5.1)
Sodium: 137 meq/L (ref 135–145)
Total Bilirubin: 0.4 mg/dL (ref 0.2–1.2)
Total Protein: 7.5 g/dL (ref 6.0–8.3)

## 2023-09-20 LAB — TSH: TSH: 2.79 u[IU]/mL (ref 0.35–5.50)

## 2023-09-20 LAB — LIPID PANEL
Cholesterol: 163 mg/dL (ref 0–200)
HDL: 37.2 mg/dL — ABNORMAL LOW (ref >39.00)
LDL Cholesterol: 91 mg/dL (ref 0–99)
NonHDL: 126.14
Total CHOL/HDL Ratio: 4
Triglycerides: 176 mg/dL — ABNORMAL HIGH (ref 0.0–149.0)
VLDL: 35.2 mg/dL (ref 0.0–40.0)

## 2023-09-20 LAB — VITAMIN D 25 HYDROXY (VIT D DEFICIENCY, FRACTURES): VITD: 45.74 ng/mL (ref 30.00–100.00)

## 2023-09-20 LAB — MICROALBUMIN / CREATININE URINE RATIO
Creatinine,U: 48 mg/dL
Microalb Creat Ratio: 14.6 mg/g (ref 0.0–30.0)
Microalb, Ur: <0.7 mg/dL (ref 0.0–1.9)

## 2023-09-20 LAB — HEMOGLOBIN A1C: Hgb A1c MFr Bld: 6.9 % — ABNORMAL HIGH (ref 4.6–6.5)

## 2023-09-20 MED ORDER — METFORMIN HCL 1000 MG PO TABS: ORAL_TABLET | ORAL | 1 refills | Status: DC

## 2023-09-20 MED ORDER — AMLODIPINE BESYLATE 10 MG PO TABS: 10.0000 mg | ORAL_TABLET | Freq: Every day | ORAL | 1 refills | Status: DC

## 2023-09-20 MED ORDER — MOUNJARO 5 MG/0.5ML ~~LOC~~ SOAJ: 5.0000 mg | SUBCUTANEOUS | 4 refills | Status: DC

## 2023-09-20 MED ORDER — AMLODIPINE BESYLATE 10 MG PO TABS: 10.0000 mg | ORAL_TABLET | Freq: Every day | ORAL | 0 refills | Status: DC

## 2023-09-20 MED ORDER — SPIRONOLACTONE 25 MG PO TABS: 25.0000 mg | ORAL_TABLET | Freq: Every day | ORAL | 1 refills | Status: DC

## 2023-09-20 MED ORDER — GABAPENTIN 300 MG PO CAPS: ORAL_CAPSULE | ORAL | 2 refills | Status: DC

## 2023-09-20 MED ORDER — ALBUTEROL SULFATE HFA 108 (90 BASE) MCG/ACT IN AERS: 1.0000 | INHALATION_SPRAY | Freq: Four times a day (QID) | RESPIRATORY_TRACT | 2 refills | Status: AC | PRN

## 2023-09-20 MED ORDER — DAPAGLIFLOZIN PROPANEDIOL 10 MG PO TABS: 10.0000 mg | ORAL_TABLET | Freq: Every day | ORAL | 1 refills | Status: DC

## 2023-09-20 MED ORDER — LOSARTAN POTASSIUM 100 MG PO TABS: 100.0000 mg | ORAL_TABLET | Freq: Every day | ORAL | 1 refills | Status: DC

## 2023-09-20 MED ORDER — RIVAROXABAN 10 MG PO TABS: 10.0000 mg | ORAL_TABLET | Freq: Every day | ORAL | 1 refills | Status: DC

## 2023-09-20 NOTE — Progress Notes (Signed)
 Patient ID: Adrian [name], male  DOB: 11/26/1960, 63 y.o.   MRN: [id number] Patient Care Team    Relationship Specialty Notifications Start End  Natalia Leatherwood, DO PCP - General Family Medicine  02/06/18   Chilton Si, MD PCP - Cardiology Cardiology  09/22/21   Nyoka Cowden, MD Consulting Physician Pulmonary Disease  02/07/18   Jill Side, OD Referring Physician   02/07/18   Shellia Cleverly, DO Consulting Physician Gastroenterology  01/20/19     Chief Complaint  Patient presents with   Hypertension    Pt is fasting.     Subjective: Adrian [name] is a 63 y.o. male present for Chronic Conditions/illness Management All past medical history, surgical history, allergies, family history, immunizations, medications and social history were updated in the electronic medical record today. All recent labs, ED visits and hospitalizations within the last year were reviewed.  Essential hypertension/hyperlipidemia/morbid obesity/CKD3 Pt reports compliance with losartan 100 mg QD, amlodipine 10 mg and HCTZ 25 mg daily. Patient denies chest pain, shortness of breath, dizziness or lower extremity edema.   Pt is not taking a daily baby ASA. Pt is not prescribed statin 2/2 intolerance x2. Compliance with zeita and repatha tolerating. Labs due next appointment Diet: low Salt Exercise: Routine exercise RF: Hypertension, hyperlipidemia, obesity, diabetes.    Type 2 diabetes mellitus without complication, without long-term current use of insulin (HCC) Patient reports compliance with metformin 1000 mg daily daily, farxiga 10 mg every day, mounjaro 2.5 weekly tolerating- mild nausea, but he does not think it is mounjaro causing Patient denies dizziness, hyperglycemic or hypoglycemic events. Patient denies numbness, tingling in the extremities or nonhealing wounds of feet.  He states he has cut back on his sugar consumption.  He is prescribed gabapentin  300 mg nightly and reports it is very  helpful-mostly for back/leg pain. Diabetes diagnosed 2019  Asthma: Patient reports his asthma has been rather stable. Not needing albuterol breakthrough.  compliant with Pulmicort and Singulair nightly. He is now taking a daily antihistamine.       03/22/2023   11:24 AM 06/12/2022    9:32 AM 06/08/2021    9:51 AM 05/17/2021    8:57 AM 07/06/2020    1:56 PM  Depression screen PHQ 2/9  Decreased Interest 0 0 0 0 0  Down, Depressed, Hopeless 0 0 0 0 0  PHQ - 2 Score 0 0 0 0 0  Altered sleeping 0      Tired, decreased energy 0      Change in appetite 0      Feeling bad or failure about yourself  0      Trouble concentrating 0      Moving slowly or fidgety/restless 0      Suicidal thoughts 0      PHQ-9 Score 0      Difficult doing work/chores Not difficult at all          03/22/2023   11:24 AM  GAD 7 : Generalized Anxiety Score  Nervous, Anxious, on Edge 0  Control/stop worrying 0  Worry too much - different things 0  Trouble relaxing 0  Restless 0  Easily annoyed or irritable 0  Afraid - awful might happen 0  Total GAD 7 Score 0  Anxiety Difficulty Not difficult at all         03/22/2023   11:24 AM 04/07/2020    6:21 PM 08/19/2018   12:00 PM 04/23/2018  6:06 PM 02/06/2018    8:43 AM  Fall Risk   Falls in the past year? 0 0 0 No No  Number falls in past yr: 0 0     Injury with Fall? 0 0     Risk for fall due to : No Fall Risks      Follow up Falls evaluation completed Falls evaluation completed       Immunization History  Administered Date(s) Administered   Influenza Split 05/03/2009, 05/19/2010, 04/27/2011, 05/02/2012, 05/18/2013   Influenza Whole 04/22/2017   Influenza, Seasonal, Injecte, Preservative Fre 05/14/2023   Influenza,inj,Quad PF,6+ Mos 05/20/2015, 05/17/2016, 04/24/2017, 05/12/2018, 05/15/2019, 04/06/2020, 06/08/2021, 06/12/2022   Influenza,inj,quad, With Preservative 05/12/2014, 05/20/2015   Moderna Sars-Covid-2 Vaccination 11/23/2019, 12/28/2019,  07/30/2020   Pneumococcal Conjugate-13 05/12/2018   Pneumococcal Polysaccharide-23 02/02/2017   Td 07/24/2003   Tdap 11/13/2010, 06/04/2019   Zoster Recombinant(Shingrix) 06/04/2019, 10/27/2019   Past Medical History:  Diagnosis Date   Adenomatous colon polyp    FS 02/2004; colo 2005 hyperplastic polyp; 2010 neg - butmucosal prolapse.    Allergy    Asthma    Atypical chest pain 05/19/2021   Blood in stool    CAD in native artery 07/28/2021   Chicken pox    Colon polyps    Diabetes mellitus without complication (HCC) 01/2017   Diverticulosis    Frequent headaches    Genital warts    GERD (gastroesophageal reflux disease)    Hemorrhoid    Hepatic steatosis 04/2019   Elevated liver enzymes-full work-up for infectious disease process with ultrasound normal with the exception of hepatic steatosis   Hyperlipidemia    Hypertension    Hypogonadism in male    labs x2: 11/14/2015 - 216 and 11/17/2015 193; declined med at that time.    Insomnia    OSA on CPAP 09/18/2016   HST 09/18/2016; ESS-4; AHI-76; O2 min- 61%   Pulmonary embolism (HCC) 01/2017   provoked by long care ride, on xarelto, est Dr. Sherene Sires   Thyroid disease    subclinical hypothyroidism per records   Allergies  Allergen Reactions   Atorvastatin     myalgias   Crestor [Rosuvastatin]      severe foot pains   Lisinopril Cough   Statins Other (See Comments)    Causes pain/ foot trouble   Past Surgical History:  Procedure Laterality Date   ANKLE SURGERY     COLONOSCOPY  2016   dr Matthias Hughs    COLONOSCOPY WITH PROPOFOL N/A 01/03/2022   Procedure: COLONOSCOPY WITH PROPOFOL;  Surgeon: Shellia Cleverly, DO;  Location: [facility];  Service: Gastroenterology;  Laterality: N/A;   ESOPHAGOGASTRODUODENOSCOPY     with dr Matthias Hughs    POLYPECTOMY  01/03/2022   Procedure: POLYPECTOMY;  Surgeon: Shellia Cleverly, DO;  Location: [facility];  Service: Gastroenterology;;   Family History  Problem Relation Age of Onset   Asthma  Mother    Arthritis Mother    Asthma Sister    Asthma Brother    Allergies Brother    Alcohol abuse Maternal Grandmother    Cancer Maternal Grandmother    Hiatal hernia Maternal Grandfather    Colon cancer Neg Hx    Esophageal cancer Neg Hx    Social History   Social History Narrative   Marital status/children/pets: Married.   Education/employment: Associates degree, employed as a Tax adviser:      -Wears a bicycle helmet riding a bike: No     -smoke alarm  in the home:Yes     - wears seatbelt: Yes     - Feels safe in their relationships: Yes    Allergies as of 09/20/2023       Reactions   Atorvastatin    myalgias   Crestor [rosuvastatin]     severe foot pains   Lisinopril Cough   Statins Other (See Comments)   Causes pain/ foot trouble        Medication List        Accurate as of September 20, 2023 11:10 AM. If you have any questions, ask your nurse or doctor.          STOP taking these medications    budesonide 180 MCG/ACT inhaler Commonly known as: PULMICORT Stopped by: Felix Pacini   Mounjaro 2.5 MG/0.5ML Pen Generic drug: tirzepatide Replaced by: Mounjaro 5 MG/0.5ML Pen Stopped by: Felix Pacini       TAKE these medications    albuterol 108 (90 Base) MCG/ACT inhaler Commonly known as: VENTOLIN HFA Inhale 1-2 puffs into the lungs every 6 (six) hours as needed for wheezing or shortness of breath. What changed: See the new instructions. Changed by: Felix Pacini   amLODipine 10 MG tablet Commonly known as: NORVASC Take 1 tablet (10 mg total) by mouth daily.   cetirizine 10 MG tablet Commonly known as: ZYRTEC Take 1 tablet (10 mg total) by mouth daily.   cyclobenzaprine 10 MG tablet Commonly known as: FLEXERIL TAKE 1 TABLET BY MOUTH EVERYDAY AT BEDTIME PRN   dapagliflozin propanediol 10 MG Tabs tablet Commonly known as: Farxiga Take 1 tablet (10 mg total) by mouth daily before breakfast.   ezetimibe 10 MG tablet Commonly  known as: ZETIA Take 1 tablet (10 mg total) by mouth daily.   fluticasone 50 MCG/ACT nasal spray Commonly known as: FLONASE Place 2 sprays into both nostrils at bedtime.   gabapentin 300 MG capsule Commonly known as: NEURONTIN TAKE 1 CAPSULE BY MOUTH EVERYDAY AT BEDTIME   losartan 100 MG tablet Commonly known as: COZAAR Take 1 tablet (100 mg total) by mouth daily.   metFORMIN 1000 MG tablet Commonly known as: GLUCOPHAGE TAKE 1 TABLET (1,000 MG TOTAL) BY MOUTH TWICE A DAY WITH FOOD   montelukast 10 MG tablet Commonly known as: SINGULAIR Take 1 tablet (10 mg total) by mouth at bedtime.   Mounjaro 5 MG/0.5ML Pen Generic drug: tirzepatide Inject 5 mg into the skin once a week. Replaces: Mounjaro 2.5 MG/0.5ML Pen Started by: Felix Pacini   Repatha 140 MG/ML Sosy Generic drug: Evolocumab Inject 140 mg into the skin every 14 (fourteen) days.   rivaroxaban 10 MG Tabs tablet Commonly known as: Xarelto Take 1 tablet (10 mg total) by mouth daily.   spironolactone 25 MG tablet Commonly known as: ALDACTONE Take 1 tablet (25 mg total) by mouth daily.   UNABLE TO FIND Med Name: CPAP       All past medical history, surgical history, allergies, family history, immunizations andmedications were updated in the EMR today and reviewed under the history and medication portions of their EMR.      CT CORONARY MORPH W/CTA COR W/SCORE W/CA W/CM &/OR WO/CM Addendum Date: 05/30/2021   IMPRESSION: 1. Coronary calcium score of 163. This was 48 percentile for age and sex matched control. 2. Normal coronary origin with right dominance. 3. CAD-RADS 1. Minimal non-obstructive CAD (0-24%). Consider non-atherosclerotic causes of chest pain. Consider preventive therapy and risk factor modification. Thomasene Ripple, DO [facility] Electronically Signed  By: Thomasene Ripple D.O.   On: 05/30/2021 14:29   Result Date: 05/30/2021 IMPRESSION: 1.  Aortic Atherosclerosis (ICD10-I70.0). 2. Hepatic steatosis.  Electronically Signed: By: Trudie Reed M.D. On: 05/30/2021 13:40    ROS: 14 pt review of systems performed and negative (unless mentioned in an HPI)  Objective: BP 132/80   Pulse 70   Temp 98.1 F (36.7 C)   Wt (!) 353 lb 6.4 oz (160.3 kg)   SpO2 95%   BMI 46.63 kg/m  Physical Exam Vitals and nursing note reviewed.  Constitutional:      General: He is not in acute distress.    Appearance: Normal appearance. He is obese. He is not ill-appearing, toxic-appearing or diaphoretic.  HENT:     Head: Normocephalic and atraumatic.  Eyes:     General: No scleral icterus.       Right eye: No discharge.        Left eye: No discharge.     Extraocular Movements: Extraocular movements intact.     Pupils: Pupils are equal, round, and reactive to light.  Cardiovascular:     Rate and Rhythm: Normal rate and regular rhythm.     Heart sounds: No murmur heard. Pulmonary:     Effort: Pulmonary effort is normal. No respiratory distress.     Breath sounds: Normal breath sounds. No wheezing, rhonchi or rales.  Musculoskeletal:     Right lower leg: No edema.     Left lower leg: No edema.  Skin:    General: Skin is warm.     Findings: No rash.  Neurological:     Mental Status: He is alert and oriented to person, place, and time. Mental status is at baseline.  Psychiatric:        Mood and Affect: Mood normal.        Behavior: Behavior normal.        Thought Content: Thought content normal.        Judgment: Judgment normal.    Diabetic Foot Exam - Simple   Simple Foot Form Diabetic Foot exam was performed with the following findings: Yes 09/20/2023 11:10 AM  Visual Inspection No deformities, no ulcerations, no other skin breakdown bilaterally: Yes Sensation Testing Intact to touch and monofilament testing bilaterally: Yes Pulse Check Posterior Tibialis and Dorsalis pulse intact bilaterally: Yes Comments     Assessment/plan: TAIMUR FIER is a 63 y.o. male present for chronic  condition management Elevated liver enzymes/Hepatic steatosis -Acute hepatitis and autoimmune hepatitis, HIV>negative -Abdominal ultrasound > hepatic steatosis.  -LFTs have been stable .   Type 2 diabetes mellitus with hyperlipidemia goal of less than 7. Continue metformin 1000 milligrams to twice daily dosing. Take with meals (was taking before bed, which may have caused his nausea) Continue farxiga 10 mg qd Increase to Mounjaro 5 mg weekly Statin myalgia> continue zeita and repatha Continue diet and exercise modifications  PNA series: Pneumonia series completed Flu shot: UTD 2024 (recommneded yearly) Microalbumin: UTD- collected 09/20/2023 Foot exam: Completed 09/20/2023 Eye exam: UTD 08/2023// Dr. Gaylan Gerold yearly visits A1c: 6.6 >>5.8>>6.3>> 7.2>7.1> 8.9 >7.7> 6.8> 7.2> 6.5 >7.2> 7.1 > 7.5 >mounjaro>collected A1c collected today  Vit d def: Vitd levels collected today  Mild persistent asthma without complication/Non-seasonal allergic rhinitis due to pollen Stable Continue  Singulair 10 mg daily.  Continue Zyrtec 10 mg nightly   Chronic thoracic back pain: Stable Continue gabapentin nightly  Continue Flexeril 10 mg nightly as needed.    OSA w/ CPAP Compliant  cpap  H/o PE- chronic anticoagulation- acquired thrombophilia(lifetime) Stable Continue  xarelto  Essential hypertension/morbid obesity/HLD< 70 ldl goal/Aortic atherosclerosis/statin intolerant/morbid obesity-Body mass index is 46.63 kg/m./Agatston coronary artery calcium score between 100 and 199 (163- 78%) stable - Goal :130/80 with is DM history.  Continue losartan 100 mg daily. Continue  spironolactone 25 mg Continue  amlodipine 10 mg daily Continue Zetia 10 mg daily Continue  Repatha injections every 2 weeks.  Statin myopathy Low sodium diet Diet and exercise changes recommended Cbc, cmp, tsh, lipid collected today   Return in about 3 months (around 01/01/2024) for Routine chronic condition  follow-up.    Orders Placed This Encounter  Procedures   Urine Microalbumin w/creat. ratio   Comp Met (CMET)   CBC   TSH   Lipid panel   Vitamin D (25 hydroxy)   Hemoglobin A1c   [facility] DIABETES EYE EXAM   Meds ordered this encounter  Medications   DISCONTD: amLODipine (NORVASC) 10 MG tablet    Sig: Take 1 tablet (10 mg total) by mouth daily.    Dispense:  30 tablet    Refill:  0   spironolactone (ALDACTONE) 25 MG tablet    Sig: Take 1 tablet (25 mg total) by mouth daily.    Dispense:  90 tablet    Refill:  1   rivaroxaban (XARELTO) 10 MG TABS tablet    Sig: Take 1 tablet (10 mg total) by mouth daily.    Dispense:  90 tablet    Refill:  1   metFORMIN (GLUCOPHAGE) 1000 MG tablet    Sig: TAKE 1 TABLET (1,000 MG TOTAL) BY MOUTH TWICE A DAY WITH FOOD    Dispense:  180 tablet    Refill:  1   losartan (COZAAR) 100 MG tablet    Sig: Take 1 tablet (100 mg total) by mouth daily.    Dispense:  90 tablet    Refill:  1   gabapentin (NEURONTIN) 300 MG capsule    Sig: TAKE 1 CAPSULE BY MOUTH EVERYDAY AT BEDTIME    Dispense:  90 capsule    Refill:  2    Please fill this script asap- pt had a script since Novemeber 2023 total 9 months and he is being told he has no refills.   dapagliflozin propanediol (FARXIGA) 10 MG TABS tablet    Sig: Take 1 tablet (10 mg total) by mouth daily before breakfast.    Dispense:  90 tablet    Refill:  1   albuterol (VENTOLIN HFA) 108 (90 Base) MCG/ACT inhaler    Sig: Inhale 1-2 puffs into the lungs every 6 (six) hours as needed for wheezing or shortness of breath.    Dispense:  8.5 each    Refill:  2   MOUNJARO 5 MG/0.5ML Pen    Sig: Inject 5 mg into the skin once a week.    Dispense:  2 mL    Refill:  4   amLODipine (NORVASC) 10 MG tablet    Sig: Take 1 tablet (10 mg total) by mouth daily.    Dispense:  90 tablet    Refill:  1   Referral Orders  No referral(s) requested today   45 minutes spent in pt encounter on day of service addressing 11  chronic conditions and 17 orders required for disease management  Note is dictated utilizing voice recognition software. Although note has been proof read prior to signing, occasional typographical errors still can be missed. If any questions arise, please do  not hesitate to call for verification.  Electronically signed by: Felix Pacini, DO [facility] [place]

## 2023-09-20 NOTE — Patient Instructions (Addendum)
 Return in about 3 months (around 01/01/2024) for Routine chronic condition follow-up.        Great to see you today.  I have refilled the medication(s) we provide.   If labs were collected or images ordered, we will inform you of  results once we have received them and reviewed. We will contact you either by echart message, or telephone call.  Please give ample time to the testing facility, and our office to run,  receive and review results. Please do not call inquiring of results, even if you can see them in your chart. We will contact you as soon as we are able. If it has been over 1 week since the test was completed, and you have not yet heard from [place], then please call us.    - echart message- for normal results that have been seen by the patient already.   - telephone call: abnormal results or if patient has not viewed results in their echart.  If a referral to a specialist was entered for you, please call us in 2 weeks if you have not heard from the specialist office to schedule.

## 2023-09-23 ENCOUNTER — Encounter: Payer: Self-pay | Admitting: Family Medicine

## 2023-09-26 DIAGNOSIS — G4733 Obstructive sleep apnea (adult) (pediatric): Secondary | ICD-10-CM | POA: Diagnosis not present

## 2023-10-27 DIAGNOSIS — G4733 Obstructive sleep apnea (adult) (pediatric): Secondary | ICD-10-CM | POA: Diagnosis not present

## 2023-11-26 DIAGNOSIS — G4733 Obstructive sleep apnea (adult) (pediatric): Secondary | ICD-10-CM | POA: Diagnosis not present

## 2023-12-17 ENCOUNTER — Other Ambulatory Visit: Payer: Self-pay | Admitting: Family Medicine

## 2023-12-26 ENCOUNTER — Other Ambulatory Visit (HOSPITAL_COMMUNITY): Payer: Self-pay

## 2023-12-26 ENCOUNTER — Telehealth: Payer: Self-pay

## 2023-12-26 NOTE — Telephone Encounter (Signed)
 Pharmacy Patient Advocate Encounter   Received notification from CoverMyMeds that prior authorization for Repatha  140 is required/requested.   Insurance verification completed.   The patient is insured through [employer] .   Per test claim: Refill too soon. PA is not needed at this time. Medication was filled 12/09/23. Next eligible fill date is 02/10/24.

## 2023-12-27 DIAGNOSIS — G4733 Obstructive sleep apnea (adult) (pediatric): Secondary | ICD-10-CM | POA: Diagnosis not present

## 2024-01-01 ENCOUNTER — Ambulatory Visit: Payer: Federal, State, Local not specified - PPO | Admitting: Family Medicine

## 2024-01-02 DIAGNOSIS — G4733 Obstructive sleep apnea (adult) (pediatric): Secondary | ICD-10-CM | POA: Diagnosis not present

## 2024-01-03 ENCOUNTER — Ambulatory Visit: Admitting: Family Medicine

## 2024-01-03 ENCOUNTER — Encounter: Payer: Self-pay | Admitting: Family Medicine

## 2024-01-03 VITALS — BP 128/82 | HR 73 | Temp 98.2°F | Wt 353.0 lb

## 2024-01-03 DIAGNOSIS — G720 Drug-induced myopathy: Secondary | ICD-10-CM

## 2024-01-03 DIAGNOSIS — M546 Pain in thoracic spine: Secondary | ICD-10-CM

## 2024-01-03 DIAGNOSIS — Z23 Encounter for immunization: Secondary | ICD-10-CM

## 2024-01-03 DIAGNOSIS — E782 Mixed hyperlipidemia: Secondary | ICD-10-CM

## 2024-01-03 DIAGNOSIS — Z7901 Long term (current) use of anticoagulants: Secondary | ICD-10-CM

## 2024-01-03 DIAGNOSIS — I10 Essential (primary) hypertension: Secondary | ICD-10-CM | POA: Diagnosis not present

## 2024-01-03 DIAGNOSIS — E1169 Type 2 diabetes mellitus with other specified complication: Secondary | ICD-10-CM | POA: Diagnosis not present

## 2024-01-03 DIAGNOSIS — Z6841 Body Mass Index (BMI) 40.0 and over, adult: Secondary | ICD-10-CM

## 2024-01-03 DIAGNOSIS — R945 Abnormal results of liver function studies: Secondary | ICD-10-CM

## 2024-01-03 DIAGNOSIS — G8929 Other chronic pain: Secondary | ICD-10-CM

## 2024-01-03 DIAGNOSIS — I700 Atherosclerosis of aorta: Secondary | ICD-10-CM

## 2024-01-03 DIAGNOSIS — Z7984 Long term (current) use of oral hypoglycemic drugs: Secondary | ICD-10-CM | POA: Diagnosis not present

## 2024-01-03 DIAGNOSIS — Z7985 Long-term (current) use of injectable non-insulin antidiabetic drugs: Secondary | ICD-10-CM

## 2024-01-03 DIAGNOSIS — I2510 Atherosclerotic heart disease of native coronary artery without angina pectoris: Secondary | ICD-10-CM

## 2024-01-03 DIAGNOSIS — G4733 Obstructive sleep apnea (adult) (pediatric): Secondary | ICD-10-CM

## 2024-01-03 DIAGNOSIS — J4530 Mild persistent asthma, uncomplicated: Secondary | ICD-10-CM

## 2024-01-03 DIAGNOSIS — R931 Abnormal findings on diagnostic imaging of heart and coronary circulation: Secondary | ICD-10-CM

## 2024-01-03 DIAGNOSIS — Z86711 Personal history of pulmonary embolism: Secondary | ICD-10-CM

## 2024-01-03 DIAGNOSIS — T466X5A Adverse effect of antihyperlipidemic and antiarteriosclerotic drugs, initial encounter: Secondary | ICD-10-CM

## 2024-01-03 LAB — POCT GLYCOSYLATED HEMOGLOBIN (HGB A1C)
HbA1c POC (<> result, manual entry): 6.1 % (ref 4.0–5.6)
HbA1c, POC (controlled diabetic range): 6.1 % (ref 0.0–7.0)
HbA1c, POC (prediabetic range): 6.1 % (ref 5.7–6.4)
Hemoglobin A1C: 6.1 % — AB (ref 4.0–5.6)

## 2024-01-03 MED ORDER — CETIRIZINE HCL 10 MG PO TABS: 10.0000 mg | ORAL_TABLET | Freq: Every day | ORAL | 11 refills | Status: AC

## 2024-01-03 MED ORDER — LOSARTAN POTASSIUM 100 MG PO TABS: 100.0000 mg | ORAL_TABLET | Freq: Every day | ORAL | 1 refills | Status: DC

## 2024-01-03 MED ORDER — GABAPENTIN 300 MG PO CAPS: ORAL_CAPSULE | ORAL | 2 refills | Status: DC

## 2024-01-03 MED ORDER — SPIRONOLACTONE 25 MG PO TABS: 25.0000 mg | ORAL_TABLET | Freq: Every day | ORAL | 1 refills | Status: DC

## 2024-01-03 MED ORDER — METFORMIN HCL 1000 MG PO TABS: ORAL_TABLET | ORAL | 1 refills | Status: DC

## 2024-01-03 MED ORDER — AMLODIPINE BESYLATE 10 MG PO TABS: 10.0000 mg | ORAL_TABLET | Freq: Every day | ORAL | 1 refills | Status: DC

## 2024-01-03 MED ORDER — MONTELUKAST SODIUM 10 MG PO TABS: 10.0000 mg | ORAL_TABLET | Freq: Every day | ORAL | 3 refills | Status: DC

## 2024-01-03 MED ORDER — DAPAGLIFLOZIN PROPANEDIOL 10 MG PO TABS: 10.0000 mg | ORAL_TABLET | Freq: Every day | ORAL | 1 refills | Status: DC

## 2024-01-03 MED ORDER — CYCLOBENZAPRINE HCL 10 MG PO TABS: ORAL_TABLET | ORAL | 1 refills | Status: DC

## 2024-01-03 MED ORDER — EZETIMIBE 10 MG PO TABS: 10.0000 mg | ORAL_TABLET | Freq: Every day | ORAL | 3 refills | Status: AC

## 2024-01-03 MED ORDER — TIRZEPATIDE 7.5 MG/0.5ML ~~LOC~~ SOAJ: 7.5000 mg | SUBCUTANEOUS | 4 refills | Status: AC

## 2024-01-03 MED ORDER — REPATHA 140 MG/ML ~~LOC~~ SOSY: 1.0000 mL | PREFILLED_SYRINGE | SUBCUTANEOUS | 3 refills | Status: AC

## 2024-01-03 MED ORDER — RIVAROXABAN 10 MG PO TABS: 10.0000 mg | ORAL_TABLET | Freq: Every day | ORAL | 1 refills | Status: DC

## 2024-01-03 NOTE — Progress Notes (Signed)
 Patient ID: Adrian [name], male  DOB: [birth year], 63 y.o.   MRN: [id number] Patient Care Team    Relationship Specialty Notifications Start End  Mariel Shope, DO PCP - General Family Medicine  02/06/18   Maudine Sos, MD PCP - Cardiology Cardiology  09/22/21   Diamond Formica, MD Consulting Physician Pulmonary Disease  02/07/18   Ranny Bye, OD Referring Physician   02/07/18   Annis Kinder, DO Consulting Physician Gastroenterology  01/20/19     Chief Complaint  Patient presents with   Diabetes    Chronic Conditions/illness Management     Subjective: Adrian [name] is a 63 y.o. male present for Chronic Conditions/illness Management All past medical history, surgical history, allergies, family history, immunizations, medications and social history were updated in the electronic medical record today. All recent labs, ED visits and hospitalizations within the last year were reviewed.  Essential hypertension/hyperlipidemia/morbid obesity/CKD3 Pt reports compliance with losartan  100 mg QD, amlodipine  10 mg and HCTZ 25 mg daily. Patient denies chest pain, shortness of breath, dizziness or lower extremity edema.   Pt is not taking a daily baby ASA. Pt is not prescribed statin 2/2 intolerance x2. Compliance with zeita and repatha  tolerating. Labs due next appointment Diet: low Salt Exercise: Routine exercise RF: Hypertension, hyperlipidemia, obesity, diabetes.    Type 2 diabetes mellitus without complication, without long-term current use of insulin (HCC) Patient reports compliance with metformin  1000 mg daily daily, farxiga  10 mg every day, mounjaro  2.5 weekly tolerating- mild nausea, but he does not think it is mounjaro  causing Patient denies dizziness, hyperglycemic or hypoglycemic events. Patient denies numbness, tingling in the extremities or nonhealing wounds of feet.  He states he has cut back on his sugar consumption.  He is prescribed gabapentin   300 mg nightly  and reports it is very helpful-mostly for back/leg pain. Diabetes diagnosed 2019  Asthma: Patient reports his asthma has been rather stable. Not needing albuterol  breakthrough.  compliant with Pulmicort  and Singulair  nightly. He is now taking a daily antihistamine.       03/22/2023   11:24 AM 06/12/2022    9:32 AM 06/08/2021    9:51 AM 05/17/2021    8:57 AM 07/06/2020    1:56 PM  Depression screen PHQ 2/9  Decreased Interest 0 0 0 0 0  Down, Depressed, Hopeless 0 0 0 0 0  PHQ - 2 Score 0 0 0 0 0  Altered sleeping 0      Tired, decreased energy 0      Change in appetite 0      Feeling bad or failure about yourself  0      Trouble concentrating 0      Moving slowly or fidgety/restless 0      Suicidal thoughts 0      PHQ-9 Score 0      Difficult doing work/chores Not difficult at all          03/22/2023   11:24 AM  GAD 7 : Generalized Anxiety Score  Nervous, Anxious, on Edge 0  Control/stop worrying 0  Worry too much - different things 0  Trouble relaxing 0  Restless 0  Easily annoyed or irritable 0  Afraid - awful might happen 0  Total GAD 7 Score 0  Anxiety Difficulty Not difficult at all         03/22/2023   11:24 AM 04/07/2020    6:21 PM 08/19/2018   12:00 PM 04/23/2018  6:06 PM 02/06/2018    8:43 AM  Fall Risk   Falls in the past year? 0 0 0  No  No   Number falls in past yr: 0 0     Injury with Fall? 0 0     Risk for fall due to : No Fall Risks      Follow up Falls evaluation completed Falls evaluation completed         Data saved with a previous flowsheet row definition    Immunization History  Administered Date(s) Administered   Influenza Split 05/03/2009, 05/19/2010, 04/27/2011, 05/02/2012, 05/18/2013   Influenza Whole 04/22/2017   Influenza, Seasonal, Injecte, Preservative Fre 05/14/2023   Influenza,inj,Quad PF,6+ Mos 05/20/2015, 05/17/2016, 04/24/2017, 05/12/2018, 05/15/2019, 04/06/2020, 06/08/2021, 06/12/2022   Influenza,inj,quad, With  Preservative 05/12/2014, 05/20/2015   Moderna Sars-Covid-2 Vaccination 11/23/2019, 12/28/2019, 07/30/2020   PNEUMOCOCCAL CONJUGATE-20 01/03/2024   Pneumococcal Conjugate-13 05/12/2018   Pneumococcal Polysaccharide-23 02/02/2017   Td 07/24/2003   Tdap 11/13/2010, 06/04/2019   Zoster Recombinant(Shingrix) 06/04/2019, 10/27/2019   Past Medical History:  Diagnosis Date   Adenomatous colon polyp    FS 02/2004; colo 2005 hyperplastic polyp; 2010 neg - butmucosal prolapse.    Allergy    Asthma    Atypical chest pain 05/19/2021   Blood in stool    CAD in native artery 07/28/2021   Chicken pox    Colon polyps    Diabetes mellitus without complication (HCC) 01/2017   Diverticulosis    Frequent headaches    Genital warts    GERD (gastroesophageal reflux disease)    Hemorrhoid    Hepatic steatosis 04/2019   Elevated liver enzymes-full work-up for infectious disease process with ultrasound normal with the exception of hepatic steatosis   Hyperlipidemia    Hypertension    Hypogonadism in male    labs x2: 11/14/2015 - 216 and 11/17/2015 193; declined med at that time.    Insomnia    OSA on CPAP 09/18/2016   HST 09/18/2016; ESS-4; AHI-76; O2 min- 61%   Pulmonary embolism (HCC) 01/2017   provoked by long care ride, on xarelto , est Dr. Waymond Hailey   Thyroid  disease    subclinical hypothyroidism per records   Allergies  Allergen Reactions   Atorvastatin     myalgias   Crestor [Rosuvastatin]      severe foot pains   Lisinopril Cough   Statins Other (See Comments)    Causes pain/ foot trouble   Past Surgical History:  Procedure Laterality Date   ANKLE SURGERY     COLONOSCOPY  2016   dr Dellis Fermo    COLONOSCOPY WITH PROPOFOL  N/A 01/03/2022   Procedure: COLONOSCOPY WITH PROPOFOL ;  Surgeon: Annis Kinder, DO;  Location: [facility];  Service: Gastroenterology;  Laterality: N/A;   ESOPHAGOGASTRODUODENOSCOPY     with dr Dellis Fermo    POLYPECTOMY  01/03/2022   Procedure: POLYPECTOMY;  Surgeon:  Annis Kinder, DO;  Location: [facility];  Service: Gastroenterology;;   Family History  Problem Relation Age of Onset   Asthma Mother    Arthritis Mother    Asthma Sister    Asthma Brother    Allergies Brother    Alcohol abuse Maternal Grandmother    Cancer Maternal Grandmother    Hiatal hernia Maternal Grandfather    Colon cancer Neg Hx    Esophageal cancer Neg Hx    Social History   Social History Narrative   Marital status/children/pets: Married.   Education/employment: Associates degree, employed as a Forensic scientist  Safety:      -Wears a bicycle helmet riding a bike: No     -smoke alarm in the home:Yes     - wears seatbelt: Yes     - Feels safe in their relationships: Yes    Allergies as of 01/03/2024       Reactions   Atorvastatin    myalgias   Crestor [rosuvastatin]     severe foot pains   Lisinopril Cough   Statins Other (See Comments)   Causes pain/ foot trouble        Medication List        Accurate as of January 03, 2024  3:32 PM. If you have any questions, ask your nurse or doctor.          albuterol  108 (90 Base) MCG/ACT inhaler Commonly known as: VENTOLIN  HFA Inhale 1-2 puffs into the lungs every 6 (six) hours as needed for wheezing or shortness of breath.   amLODipine  10 MG tablet Commonly known as: NORVASC  Take 1 tablet (10 mg total) by mouth daily.   cetirizine  10 MG tablet Commonly known as: ZYRTEC  Take 1 tablet (10 mg total) by mouth daily.   cyclobenzaprine  10 MG tablet Commonly known as: FLEXERIL  TAKE 1 TABLET BY MOUTH EVERYDAY AT BEDTIME AS NEEDED   dapagliflozin  propanediol 10 MG Tabs tablet Commonly known as: Farxiga  Take 1 tablet (10 mg total) by mouth daily before breakfast.   ezetimibe  10 MG tablet Commonly known as: ZETIA  Take 1 tablet (10 mg total) by mouth daily.   fluticasone  50 MCG/ACT nasal spray Commonly known as: FLONASE  Place 2 sprays into both nostrils at bedtime.   gabapentin  300 MG  capsule Commonly known as: NEURONTIN  TAKE 1 CAPSULE BY MOUTH EVERYDAY AT BEDTIME   losartan  100 MG tablet Commonly known as: COZAAR  Take 1 tablet (100 mg total) by mouth daily.   metFORMIN  1000 MG tablet Commonly known as: GLUCOPHAGE  TAKE 1 TABLET (1,000 MG TOTAL) BY MOUTH TWICE A DAY WITH FOOD   montelukast  10 MG tablet Commonly known as: SINGULAIR  Take 1 tablet (10 mg total) by mouth at bedtime.   Mounjaro  5 MG/0.5ML Pen Generic drug: tirzepatide  Inject 5 mg into the skin once a week. What changed: Another medication with the same name was added. Make sure you understand how and when to take each. Changed by: Abdirahman Chittum   tirzepatide  7.5 MG/0.5ML Pen Commonly known as: MOUNJARO  Inject 7.5 mg into the skin once a week. What changed: You were already taking a medication with the same name, and this prescription was added. Make sure you understand how and when to take each. Changed by: Napolean Backbone   Repatha  140 MG/ML Sosy Generic drug: Evolocumab  Inject 140 mg into the skin every 14 (fourteen) days.   rivaroxaban  10 MG Tabs tablet Commonly known as: Xarelto  Take 1 tablet (10 mg total) by mouth daily.   spironolactone  25 MG tablet Commonly known as: ALDACTONE  Take 1 tablet (25 mg total) by mouth daily.   UNABLE TO FIND Med Name: CPAP       All past medical history, surgical history, allergies, family history, immunizations andmedications were updated in the EMR today and reviewed under the history and medication portions of their EMR.      CT CORONARY MORPH W/CTA COR W/SCORE W/CA W/CM &/OR WO/CM Addendum Date: 05/30/2021   IMPRESSION: 1. Coronary calcium score of 163. This was 46 percentile for age and sex matched control. 2. Normal coronary origin with right dominance. 3.  CAD-RADS 1. Minimal non-obstructive CAD (0-24%). Consider non-atherosclerotic causes of chest pain. Consider preventive therapy and risk factor modification. Kardie Tobb, DO [facility] Electronically  Signed   By: Kardie  Tobb D.O.   On: 05/30/2021 14:29   Result Date: 05/30/2021 IMPRESSION: 1.  Aortic Atherosclerosis (ICD10-I70.0). 2. Hepatic steatosis. Electronically Signed: By: Alexandria Angel M.D. On: 05/30/2021 13:40    ROS: 14 pt review of systems performed and negative (unless mentioned in an HPI)  Objective: BP 128/82   Pulse 73   Temp 98.2 F (36.8 C)   Wt (!) 353 lb (160.1 kg)   SpO2 95%   BMI 46.57 kg/m  Physical Exam Vitals and nursing note reviewed.  Constitutional:      General: He is not in acute distress.    Appearance: Normal appearance. He is obese. He is not ill-appearing, toxic-appearing or diaphoretic.  HENT:     Head: Normocephalic and atraumatic.   Eyes:     General: No scleral icterus.       Right eye: No discharge.        Left eye: No discharge.     Extraocular Movements: Extraocular movements intact.     Pupils: Pupils are equal, round, and reactive to light.    Cardiovascular:     Rate and Rhythm: Normal rate and regular rhythm.     Heart sounds: No murmur heard. Pulmonary:     Effort: Pulmonary effort is normal. No respiratory distress.     Breath sounds: Normal breath sounds. No wheezing, rhonchi or rales.   Musculoskeletal:     Cervical back: Neck supple.     Right lower leg: No edema.     Left lower leg: No edema.   Skin:    General: Skin is warm.     Findings: No rash.   Neurological:     Mental Status: He is alert and oriented to person, place, and time. Mental status is at baseline.   Psychiatric:        Mood and Affect: Mood normal.        Behavior: Behavior normal.        Thought Content: Thought content normal.        Judgment: Judgment normal.     Assessment/plan: CHRISTIEN FRANKL is a 63 y.o. male present for chronic condition management Elevated liver enzymes/Hepatic steatosis -Acute hepatitis and autoimmune hepatitis, HIV>negative -Abdominal ultrasound > hepatic steatosis.  -LFTs have been stable .   Type 2  diabetes mellitus with hyperlipidemia goal of less than 7. Continue metformin  1000 milligrams to twice daily dosing. Take with meals (was taking before bed, which may have caused his nausea) Continue your farxiga  10 mg qd Increase to Mounjaro  5 mg weekly >  Statin myalgia> continue zeita and repatha  Continue diet and exercise modifications  PNA series: prevnar 20 updated today Flu shot: UTD 2024 (recommneded yearly) Microalbumin: UTD- collected 09/20/2023 Foot exam: Completed 09/20/2023 Eye exam: UTD 08/2023// Dr. Cornelio Dike yearly visits A1c: 6.6 >>5.8>>6.3>> 7.2>7.1> 8.9 >7.7> 6.8> 7.2> 6.5 >7.2> 7.1 > 7.5 >mounjaro >6.9>6.1 collected A1c collected today  Vit d def: Vitd levels UTD 08/2023  Mild persistent asthma without complication/Non-seasonal allergic rhinitis due to pollen Stable Continue Singulair  10 mg daily.  Continue Zyrtec  10 mg nightly   Chronic thoracic back pain: Stable Continue gabapentin  nightly  Continue Flexeril  10 mg nightly as needed.    OSA w/ CPAP Compliant cpap  H/o PE- chronic anticoagulation- acquired thrombophilia(lifetime) Stable Continue xarelto   Essential hypertension/morbid obesity/HLD< 70  ldl goal/Aortic atherosclerosis/statin intolerant/morbid obesity-Body mass index is 46.57 kg/m./Agatston coronary artery calcium score between 100 and 199 (163- 78%) Stable - Goal :130/80 with is DM history.  Continue losartan  100 mg daily. Continue  spironolactone  25 mg Continue amlodipine  10 mg daily Continue Zetia  10 mg daily Continue  Repatha  injections every 2 weeks.  Statin myopathy Low sodium diet Diet and exercise changes recommended Labs UTD 08/2023   Return in about 15 weeks (around 04/17/2024).    Orders Placed This Encounter  Procedures   Pneumococcal conjugate vaccine 20-valent (Prevnar 20)   POCT HgB A1C   Meds ordered this encounter  Medications   spironolactone  (ALDACTONE ) 25 MG tablet    Sig: Take 1 tablet (25 mg total) by  mouth daily.    Dispense:  90 tablet    Refill:  1   rivaroxaban  (XARELTO ) 10 MG TABS tablet    Sig: Take 1 tablet (10 mg total) by mouth daily.    Dispense:  90 tablet    Refill:  1   montelukast  (SINGULAIR ) 10 MG tablet    Sig: Take 1 tablet (10 mg total) by mouth at bedtime.    Dispense:  90 tablet    Refill:  3   metFORMIN  (GLUCOPHAGE ) 1000 MG tablet    Sig: TAKE 1 TABLET (1,000 MG TOTAL) BY MOUTH TWICE A DAY WITH FOOD    Dispense:  180 tablet    Refill:  1   losartan  (COZAAR ) 100 MG tablet    Sig: Take 1 tablet (100 mg total) by mouth daily.    Dispense:  90 tablet    Refill:  1   gabapentin  (NEURONTIN ) 300 MG capsule    Sig: TAKE 1 CAPSULE BY MOUTH EVERYDAY AT BEDTIME    Dispense:  90 capsule    Refill:  2    Please fill this script asap- pt had a script since Novemeber 2023 total 9 months and he is being told he has no refills.   ezetimibe  (ZETIA ) 10 MG tablet    Sig: Take 1 tablet (10 mg total) by mouth daily.    Dispense:  90 tablet    Refill:  3   Evolocumab  (REPATHA ) 140 MG/ML SOSY    Sig: Inject 140 mg into the skin every 14 (fourteen) days.    Dispense:  6 mL    Refill:  3   dapagliflozin  propanediol (FARXIGA ) 10 MG TABS tablet    Sig: Take 1 tablet (10 mg total) by mouth daily before breakfast.    Dispense:  90 tablet    Refill:  1   cetirizine  (ZYRTEC ) 10 MG tablet    Sig: Take 1 tablet (10 mg total) by mouth daily.    Dispense:  30 tablet    Refill:  11   cyclobenzaprine  (FLEXERIL ) 10 MG tablet    Sig: TAKE 1 TABLET BY MOUTH EVERYDAY AT BEDTIME AS NEEDED    Dispense:  90 tablet    Refill:  1   amLODipine  (NORVASC ) 10 MG tablet    Sig: Take 1 tablet (10 mg total) by mouth daily.    Dispense:  90 tablet    Refill:  1   tirzepatide  (MOUNJARO ) 7.5 MG/0.5ML Pen    Sig: Inject 7.5 mg into the skin once a week.    Dispense:  2 mL    Refill:  4   Referral Orders  No referral(s) requested today    Note is dictated utilizing voice recognition software.  Although note has  been proof read prior to signing, occasional typographical errors still can be missed. If any questions arise, please do not hesitate to call for verification.  Electronically signed by: Napolean Backbone, DO [facility] [place]

## 2024-01-03 NOTE — Patient Instructions (Addendum)

## 2024-01-06 ENCOUNTER — Ambulatory Visit: Payer: Self-pay

## 2024-01-06 NOTE — Telephone Encounter (Signed)
 Sent pt a mychart.

## 2024-01-06 NOTE — Telephone Encounter (Signed)
   FYI Only or Action Required?: FYI only for provider  Patient was last seen in primary care on 01/03/2024 by Napolean Backbone A, DO. Called Nurse Triage reporting Tick Removal. Symptoms began today. Interventions attempted: Nothing. Symptoms are: stable.  Triage Disposition: Home Care  Patient/caregiver understands and will follow disposition?: Yes  Copied from CRM [id number]. Topic: Clinical - Red Word Triage >> Jan 06, 2024 12:22 PM Marlan Silva wrote: Red Word that prompted transfer to Nurse Triage: Patient called in stating that he had a Tick bite, has been there for 24 hours patient denies any other symptoms. Reason for Disposition  Unknown type of tick bite with no complications  Answer Assessment - Initial Assessment Questions 1. ATTACHED:  Is the tick still on the skin?  (e.g., yes, no, unsure)     no 2. ONSET - TICK STILL ATTACHED:  How long do you think the tick has been on your skin? (e.g., hours, days, unsure)  Note:  Is there a recent activity (camping, hiking) where the caller may have been exposed?     Right thigh, states at least 24 hours ago, tick removed 3. ONSET - TICK NOT STILL ATTACHED: If the tick has been removed, how long do you think the tick was attached before you removed it? (e.g., 5 hours, 2 days). When was this?     About 24 hours 4. LOCATION: Where is the tick bite located? (e.g., arm, leg)     Right thigh 5. TYPE of TICK: Is it a wood tick or a deer tick? (e.g., deer tick, wood tick; unsure)     Tiny tick 6. SIZE of TICK: How big is the tick? (e.g., size of poppy seed, apple seed, watermelon seed; unsure) Note: Deer ticks can be the size of a poppy seed (nymph) or an apple seed (adult).       Poppy seed 7. ENGORGED: Did the tick look flat or engorged (full, swollen)? (e.g., flat, engorged; unsure)     yfes 8. OTHER SYMPTOMS: Do you have any other symptoms? (e.g., fever, rash, redness at bite area, red ring around bite)     denies 9. PREGNANCY:  Is there any chance you are pregnant? When was your last menstrual period?     na  Protocols used: Tick Bite-A-AH

## 2024-01-07 ENCOUNTER — Ambulatory Visit: Payer: Self-pay

## 2024-01-07 NOTE — Telephone Encounter (Signed)
 Copied from CRM [id number]. Topic: Clinical - Red Word Triage >> Jan 06, 2024 12:22 PM Marlan Silva wrote: Red Word that prompted transfer to Nurse Triage: Patient called in stating that he had a Tick bite, has been there for 24 hours patient denies any other symptoms. >> Jan 07, 2024 10:12 AM Corin V wrote: Patient called in his legs are extremely achey when he woke up. Moving around has reduced the aches but he is concerned due to tick bite. The tick bite has gotten worse but does not have the bullseye pattern.   Patient was just called by nurse and has apt set up for tomorrow.  No triage needed.

## 2024-01-07 NOTE — Telephone Encounter (Signed)
 FYI Only or Action Required?: FYI only for provider  Patient was last seen in primary care on 01/03/2024 by Napolean Backbone A, DO. Called Nurse Triage reporting Tick Bite. Symptoms began yesterday. Interventions attempted: Nothing. Symptoms are: gradually worsening.  Triage Disposition: See Physician Within 24 Hours  Patient/caregiver understands and will follow disposition?: Yes             Copied from CRM [id number]. Topic: Clinical - Red Word Triage >> Jan 06, 2024 12:22 PM Marlan Silva wrote: Red Word that prompted transfer to Nurse Triage: Patient called in stating that he had a Tick bite, has been there for 24 hours patient denies any other symptoms. >> Jan 07, 2024 10:12 AM Corin V wrote: Patient called in his legs are extremely achey when he woke up. Moving around has reduced the aches but he is concerned due to tick bite. The tick bite has gotten worse but does not have the bullseye pattern. Reason for Disposition  [1] Red or very tender (to touch) area AND [2] started over 24 hours after the bite  Answer Assessment - Initial Assessment Questions 1. ATTACHED:  Is the tick still on the skin?  (e.g., yes, no, unsure)     The head, not body 2. ONSET - TICK STILL ATTACHED:  How long do you think the tick has been on your skin? (e.g., hours, days, unsure)  Note:  Is there a recent activity (camping, hiking) where the caller may have been exposed?     3. ONSET - TICK NOT STILL ATTACHED: If the tick has been removed, how long do you think the tick was attached before you removed it? (e.g., 5 hours, 2 days). When was this?     The head, however he cannot see the tick 4. LOCATION: Where is the tick bite located? (e.g., arm, leg)     Right thigh 5. TYPE of TICK: Is it a wood tick or a deer tick? (e.g., deer tick, wood tick; unsure)     Unsure 6. SIZE of TICK: How big is the tick? (e.g., size of poppy seed, apple seed, watermelon seed; unsure) Note: Deer ticks can be the size  of a poppy seed (nymph) or an apple seed (adult).       Apple seed 7. ENGORGED: Did the tick look flat or engorged (full, swollen)? (e.g., flat, engorged; unsure)     Full  8. OTHER SYMPTOMS: Do you have any other symptoms? (e.g., fever, rash, redness at bite area, red ring around bite)     Redness, no fever.   Patient reports he sustained a tick bite from yesterday, he reports achy legs, the pain has increased since yesterday. The bite was on the right thigh. There is redress in the area, forming a circle with purple dots, no bulleye', 1/2 inch diameter.  Protocols used: Tick Bite-A-AH

## 2024-01-07 NOTE — Telephone Encounter (Signed)
 Pt scheduled with PCP tomorrow for evaluation

## 2024-01-08 ENCOUNTER — Encounter: Payer: Self-pay | Admitting: Family Medicine

## 2024-01-08 ENCOUNTER — Ambulatory Visit: Admitting: Family Medicine

## 2024-01-08 VITALS — BP 130/80 | HR 65 | Temp 98.1°F | Wt 353.0 lb

## 2024-01-08 DIAGNOSIS — W57XXXA Bitten or stung by nonvenomous insect and other nonvenomous arthropods, initial encounter: Secondary | ICD-10-CM

## 2024-01-08 DIAGNOSIS — S70361A Insect bite (nonvenomous), right thigh, initial encounter: Secondary | ICD-10-CM | POA: Diagnosis not present

## 2024-01-08 DIAGNOSIS — M791 Myalgia, unspecified site: Secondary | ICD-10-CM

## 2024-01-08 MED ORDER — DOXYCYCLINE HYCLATE 100 MG PO TABS: 100.0000 mg | ORAL_TABLET | Freq: Two times a day (BID) | ORAL | 0 refills | Status: DC

## 2024-01-08 NOTE — Progress Notes (Signed)
 Marcine Severin , 1961-02-24, 63 y.o., male MRN: [id number] Patient Care Team    Relationship Specialty Notifications Start End  Mariel Shope, DO PCP - General Family Medicine  02/06/18   Maudine Sos, MD PCP - Cardiology Cardiology  09/22/21   Diamond Formica, MD Consulting Physician Pulmonary Disease  02/07/18   Ranny Bye, OD Referring Physician   02/07/18   Annis Kinder, DO Consulting Physician Gastroenterology  01/20/19     Chief Complaint  Patient presents with   Tick Removal    Removed tick on Monday from inner thigh. Tick was fully attached. Some redness. Denies irritation.      Subjective: ZACHARIAH PAVEK is a 63 y.o. Pt presents for an OV with complaints of tick bite of 3 days duration.  Associated symptoms include redness and swelling. Pt endorses myalgia yesterday for unknown cause. (See ROS) He reports tick was attached well, and present at least 24 hours or more.      03/22/2023   11:24 AM 06/12/2022    9:32 AM 06/08/2021    9:51 AM 05/17/2021    8:57 AM 07/06/2020    1:56 PM  Depression screen PHQ 2/9  Decreased Interest 0 0 0 0 0  Down, Depressed, Hopeless 0 0 0 0 0  PHQ - 2 Score 0 0 0 0 0  Altered sleeping 0      Tired, decreased energy 0      Change in appetite 0      Feeling bad or failure about yourself  0      Trouble concentrating 0      Moving slowly or fidgety/restless 0      Suicidal thoughts 0      PHQ-9 Score 0      Difficult doing work/chores Not difficult at all        Allergies  Allergen Reactions   Atorvastatin     myalgias   Crestor [Rosuvastatin]      severe foot pains   Lisinopril Cough   Statins Other (See Comments)    Causes pain/ foot trouble   Social History   Social History Narrative   Marital status/children/pets: Married.   Education/employment: Associates degree, employed as a Tax adviser:      -Wears a bicycle helmet riding a bike: No     -smoke alarm in the home:Yes     - wears  seatbelt: Yes     - Feels safe in their relationships: Yes   Past Medical History:  Diagnosis Date   Adenomatous colon polyp    FS 02/2004; colo 2005 hyperplastic polyp; 2010 neg - butmucosal prolapse.    Allergy    Asthma    Atypical chest pain 05/19/2021   Blood in stool    CAD in native artery 07/28/2021   Chicken pox    Colon polyps    Diabetes mellitus without complication (HCC) 01/2017   Diverticulosis    Frequent headaches    Genital warts    GERD (gastroesophageal reflux disease)    Hemorrhoid    Hepatic steatosis 04/2019   Elevated liver enzymes-full work-up for infectious disease process with ultrasound normal with the exception of hepatic steatosis   Hyperlipidemia    Hypertension    Hypogonadism in male    labs x2: 11/14/2015 - 216 and 11/17/2015 193; declined med at that time.    Insomnia    OSA on CPAP 09/18/2016  HST 09/18/2016; ESS-4; AHI-76; O2 min- 61%   Pulmonary embolism (HCC) 01/2017   provoked by long care ride, on xarelto , est Dr. Waymond Hailey   Thyroid  disease    subclinical hypothyroidism per records   Past Surgical History:  Procedure Laterality Date   ANKLE SURGERY     COLONOSCOPY  2016   dr Dellis Fermo    COLONOSCOPY WITH PROPOFOL  N/A 01/03/2022   Procedure: COLONOSCOPY WITH PROPOFOL ;  Surgeon: Annis Kinder, DO;  Location: [facility];  Service: Gastroenterology;  Laterality: N/A;   ESOPHAGOGASTRODUODENOSCOPY     with dr Dellis Fermo    POLYPECTOMY  01/03/2022   Procedure: POLYPECTOMY;  Surgeon: Annis Kinder, DO;  Location: [facility];  Service: Gastroenterology;;   Family History  Problem Relation Age of Onset   Asthma Mother    Arthritis Mother    Asthma Sister    Asthma Brother    Allergies Brother    Alcohol abuse Maternal Grandmother    Cancer Maternal Grandmother    Hiatal hernia Maternal Grandfather    Colon cancer Neg Hx    Esophageal cancer Neg Hx    Allergies as of 01/08/2024       Reactions   Atorvastatin    myalgias    Crestor [rosuvastatin]     severe foot pains   Lisinopril Cough   Statins Other (See Comments)   Causes pain/ foot trouble        Medication List        Accurate as of January 08, 2024  2:36 PM. If you have any questions, ask your nurse or doctor.          albuterol  108 (90 Base) MCG/ACT inhaler Commonly known as: VENTOLIN  HFA Inhale 1-2 puffs into the lungs every 6 (six) hours as needed for wheezing or shortness of breath.   amLODipine  10 MG tablet Commonly known as: NORVASC  Take 1 tablet (10 mg total) by mouth daily.   cetirizine  10 MG tablet Commonly known as: ZYRTEC  Take 1 tablet (10 mg total) by mouth daily.   cyclobenzaprine  10 MG tablet Commonly known as: FLEXERIL  TAKE 1 TABLET BY MOUTH EVERYDAY AT BEDTIME AS NEEDED   dapagliflozin  propanediol 10 MG Tabs tablet Commonly known as: Farxiga  Take 1 tablet (10 mg total) by mouth daily before breakfast.   doxycycline  100 MG tablet Commonly known as: VIBRA -TABS Take 1 tablet (100 mg total) by mouth 2 (two) times daily. Started by: Napolean Backbone   ezetimibe  10 MG tablet Commonly known as: ZETIA  Take 1 tablet (10 mg total) by mouth daily.   fluticasone  50 MCG/ACT nasal spray Commonly known as: FLONASE  Place 2 sprays into both nostrils at bedtime.   gabapentin  300 MG capsule Commonly known as: NEURONTIN  TAKE 1 CAPSULE BY MOUTH EVERYDAY AT BEDTIME   losartan  100 MG tablet Commonly known as: COZAAR  Take 1 tablet (100 mg total) by mouth daily.   metFORMIN  1000 MG tablet Commonly known as: GLUCOPHAGE  TAKE 1 TABLET (1,000 MG TOTAL) BY MOUTH TWICE A DAY WITH FOOD   montelukast  10 MG tablet Commonly known as: SINGULAIR  Take 1 tablet (10 mg total) by mouth at bedtime.   Mounjaro  5 MG/0.5ML Pen Generic drug: tirzepatide  Inject 5 mg into the skin once a week.   tirzepatide  7.5 MG/0.5ML Pen Commonly known as: MOUNJARO  Inject 7.5 mg into the skin once a week.   Repatha  140 MG/ML Sosy Generic drug:  Evolocumab  Inject 140 mg into the skin every 14 (fourteen) days.   rivaroxaban  10 MG  Tabs tablet Commonly known as: Xarelto  Take 1 tablet (10 mg total) by mouth daily.   spironolactone  25 MG tablet Commonly known as: ALDACTONE  Take 1 tablet (25 mg total) by mouth daily.   UNABLE TO FIND Med Name: CPAP        All past medical history, surgical history, allergies, family history, immunizations andmedications were updated in the EMR today and reviewed under the history and medication portions of their EMR.     Review of Systems  Constitutional:  Positive for malaise/fatigue. Negative for chills and fever.  Gastrointestinal: Negative.   Musculoskeletal:  Positive for myalgias.  Neurological:  Negative for dizziness and headaches.   Negative, with the exception of above mentioned in HPI   Objective:  BP 130/80   Pulse 65   Temp 98.1 F (36.7 C)   Wt (!) 353 lb (160.1 kg)   SpO2 98%   BMI 46.57 kg/m  Body mass index is 46.57 kg/m. Physical Exam Vitals and nursing note reviewed. Exam conducted with a chaperone present.  Constitutional:      General: He is not in acute distress.    Appearance: Normal appearance. He is not ill-appearing, toxic-appearing or diaphoretic.  HENT:     Head: Normocephalic and atraumatic.   Eyes:     General: No scleral icterus.       Right eye: No discharge.        Left eye: No discharge.     Extraocular Movements: Extraocular movements intact.     Pupils: Pupils are equal, round, and reactive to light.    Skin:    General: Skin is warm and dry.     Coloration: Skin is not jaundiced or pale.     Findings: Lesion (right inner thight insect bite with mild erythemic ring surrounding.) present. No rash.   Neurological:     Mental Status: He is alert and oriented to person, place, and time. Mental status is at baseline.   Psychiatric:        Mood and Affect: Mood normal.        Behavior: Behavior normal.        Thought Content: Thought  content normal.        Judgment: Judgment normal.      No results found. No results found. No results found for this or any previous visit (from the past 24 hours).  Assessment/Plan: JUANANTONIO STOLAR is a 63 y.o. male present for OV for  Tick bite of right thigh, initial encounter (Primary)/myalgia Start doxy BID x7 d, if positive titer will call in full 21 day course.  - B. burgdorfi antibodies - Rocky mtn spotted fvr abs pnl(IgG+IgM) - rpt titer in 3 weeks if negative - tick prevention AVS provided.  F/u prn  Reviewed expectations re: course of current medical issues. Discussed self-management of symptoms. Outlined signs and symptoms indicating need for more acute intervention. Patient verbalized understanding and all questions were answered. Patient received an After-Visit Summary.    Orders Placed This Encounter  Procedures   B. burgdorfi antibodies   Rocky mtn spotted fvr abs pnl(IgG+IgM)   Meds ordered this encounter  Medications   doxycycline  (VIBRA -TABS) 100 MG tablet    Sig: Take 1 tablet (100 mg total) by mouth 2 (two) times daily.    Dispense:  14 tablet    Refill:  0   Referral Orders  No referral(s) requested today     Note is dictated utilizing voice recognition software. Although note has  been proof read prior to signing, occasional typographical errors still can be missed. If any questions arise, please do not hesitate to call for verification.   electronically signed by:  Napolean Backbone, DO  [facility] - [place]

## 2024-01-08 NOTE — Patient Instructions (Addendum)

## 2024-01-10 LAB — B. BURGDORFI ANTIBODIES: B burgdorferi Ab IgG+IgM: <0.9 {index}

## 2024-01-13 ENCOUNTER — Ambulatory Visit: Payer: Self-pay | Admitting: Family Medicine

## 2024-01-13 DIAGNOSIS — W57XXXA Bitten or stung by nonvenomous insect and other nonvenomous arthropods, initial encounter: Secondary | ICD-10-CM

## 2024-01-13 LAB — ROCKY MTN SPOTTED FVR ABS PNL(IGG+IGM)
RMSF IgG: NOT DETECTED
RMSF IgM: NOT DETECTED

## 2024-01-26 DIAGNOSIS — G4733 Obstructive sleep apnea (adult) (pediatric): Secondary | ICD-10-CM | POA: Diagnosis not present

## 2024-02-01 DIAGNOSIS — G4733 Obstructive sleep apnea (adult) (pediatric): Secondary | ICD-10-CM | POA: Diagnosis not present

## 2024-02-11 ENCOUNTER — Other Ambulatory Visit (INDEPENDENT_AMBULATORY_CARE_PROVIDER_SITE_OTHER)

## 2024-02-11 DIAGNOSIS — S70361A Insect bite (nonvenomous), right thigh, initial encounter: Secondary | ICD-10-CM

## 2024-02-11 DIAGNOSIS — W57XXXA Bitten or stung by nonvenomous insect and other nonvenomous arthropods, initial encounter: Secondary | ICD-10-CM | POA: Diagnosis not present

## 2024-02-12 LAB — B. BURGDORFI ANTIBODIES: B burgdorferi Ab IgG+IgM: <0.9 {index}

## 2024-02-13 ENCOUNTER — Ambulatory Visit: Payer: Self-pay | Admitting: Family Medicine

## 2024-02-26 DIAGNOSIS — G4733 Obstructive sleep apnea (adult) (pediatric): Secondary | ICD-10-CM | POA: Diagnosis not present

## 2024-03-03 ENCOUNTER — Other Ambulatory Visit (HOSPITAL_COMMUNITY): Payer: Self-pay

## 2024-03-03 ENCOUNTER — Telehealth: Payer: Self-pay

## 2024-03-03 DIAGNOSIS — G4733 Obstructive sleep apnea (adult) (pediatric): Secondary | ICD-10-CM | POA: Diagnosis not present

## 2024-03-03 NOTE — Telephone Encounter (Signed)
 Pharmacy Patient Advocate Encounter   Received notification from CoverMyMeds that prior authorization for Repatha  140 is required/requested.   Insurance verification completed.   The patient is insured through [facility] .   Per test claim: PA required; PA submitted to above mentioned insurance via Latent Key/confirmation #/EOC [id number] Status is pending

## 2024-03-04 ENCOUNTER — Other Ambulatory Visit (HOSPITAL_COMMUNITY): Payer: Self-pay

## 2024-03-04 NOTE — Telephone Encounter (Signed)
 Pharmacy Patient Advocate Encounter  Received notification from [facility] [employer] that Prior Authorization for REPATHA  has been APPROVED from 02/02/24 to 03/03/25   PA #/Case ID/Reference #: [id number]

## 2024-03-12 ENCOUNTER — Other Ambulatory Visit (HOSPITAL_COMMUNITY): Payer: Self-pay

## 2024-04-01 DIAGNOSIS — G4733 Obstructive sleep apnea (adult) (pediatric): Secondary | ICD-10-CM | POA: Diagnosis not present

## 2024-04-03 DIAGNOSIS — G4733 Obstructive sleep apnea (adult) (pediatric): Secondary | ICD-10-CM | POA: Diagnosis not present

## 2024-04-06 ENCOUNTER — Other Ambulatory Visit: Payer: Self-pay | Admitting: Family Medicine

## 2024-04-17 ENCOUNTER — Ambulatory Visit: Admitting: Family Medicine

## 2024-04-17 ENCOUNTER — Encounter: Payer: Self-pay | Admitting: Family Medicine

## 2024-04-17 VITALS — BP 124/79 | HR 65 | Temp 98.3°F | Wt 335.0 lb

## 2024-04-17 DIAGNOSIS — E038 Other specified hypothyroidism: Secondary | ICD-10-CM

## 2024-04-17 DIAGNOSIS — E782 Mixed hyperlipidemia: Secondary | ICD-10-CM | POA: Diagnosis not present

## 2024-04-17 DIAGNOSIS — G720 Drug-induced myopathy: Secondary | ICD-10-CM

## 2024-04-17 DIAGNOSIS — G8929 Other chronic pain: Secondary | ICD-10-CM

## 2024-04-17 DIAGNOSIS — Z6841 Body Mass Index (BMI) 40.0 and over, adult: Secondary | ICD-10-CM

## 2024-04-17 DIAGNOSIS — Z7984 Long term (current) use of oral hypoglycemic drugs: Secondary | ICD-10-CM

## 2024-04-17 DIAGNOSIS — E1169 Type 2 diabetes mellitus with other specified complication: Secondary | ICD-10-CM | POA: Diagnosis not present

## 2024-04-17 DIAGNOSIS — T466X5A Adverse effect of antihyperlipidemic and antiarteriosclerotic drugs, initial encounter: Secondary | ICD-10-CM

## 2024-04-17 DIAGNOSIS — I700 Atherosclerosis of aorta: Secondary | ICD-10-CM

## 2024-04-17 DIAGNOSIS — I272 Pulmonary hypertension, unspecified: Secondary | ICD-10-CM

## 2024-04-17 DIAGNOSIS — I10 Essential (primary) hypertension: Secondary | ICD-10-CM

## 2024-04-17 DIAGNOSIS — Z23 Encounter for immunization: Secondary | ICD-10-CM | POA: Diagnosis not present

## 2024-04-17 DIAGNOSIS — Z7901 Long term (current) use of anticoagulants: Secondary | ICD-10-CM

## 2024-04-17 DIAGNOSIS — G4733 Obstructive sleep apnea (adult) (pediatric): Secondary | ICD-10-CM

## 2024-04-17 DIAGNOSIS — I2510 Atherosclerotic heart disease of native coronary artery without angina pectoris: Secondary | ICD-10-CM

## 2024-04-17 DIAGNOSIS — R931 Abnormal findings on diagnostic imaging of heart and coronary circulation: Secondary | ICD-10-CM

## 2024-04-17 DIAGNOSIS — R748 Abnormal levels of other serum enzymes: Secondary | ICD-10-CM

## 2024-04-17 DIAGNOSIS — Z7985 Long-term (current) use of injectable non-insulin antidiabetic drugs: Secondary | ICD-10-CM

## 2024-04-17 DIAGNOSIS — J301 Allergic rhinitis due to pollen: Secondary | ICD-10-CM

## 2024-04-17 DIAGNOSIS — J4530 Mild persistent asthma, uncomplicated: Secondary | ICD-10-CM

## 2024-04-17 LAB — COMPREHENSIVE METABOLIC PANEL WITH GFR
ALT: 35 U/L (ref 0–53)
AST: 26 U/L (ref 0–37)
Albumin: 4.2 g/dL (ref 3.5–5.2)
Alkaline Phosphatase: 58 U/L (ref 39–117)
BUN: 16 mg/dL (ref 6–23)
CO2: 32 meq/L (ref 19–32)
Calcium: 10.6 mg/dL — ABNORMAL HIGH (ref 8.4–10.5)
Chloride: 101 meq/L (ref 96–112)
Creatinine, Ser: 0.92 mg/dL (ref 0.40–1.50)
GFR: 89.13 mL/min (ref >60.00)
Glucose, Bld: 119 mg/dL — ABNORMAL HIGH (ref 70–99)
Potassium: 4.5 meq/L (ref 3.5–5.1)
Sodium: 138 meq/L (ref 135–145)
Total Bilirubin: 0.5 mg/dL (ref 0.2–1.2)
Total Protein: 7 g/dL (ref 6.0–8.3)

## 2024-04-17 LAB — POCT GLYCOSYLATED HEMOGLOBIN (HGB A1C)
HbA1c POC (<> result, manual entry): 5.7 % (ref 4.0–5.6)
HbA1c, POC (controlled diabetic range): 5.7 % (ref 0.0–7.0)
HbA1c, POC (prediabetic range): 5.7 % (ref 5.7–6.4)
Hemoglobin A1C: 5.7 % — AB (ref 4.0–5.6)

## 2024-04-17 LAB — CBC
HCT: 44 % (ref 39.0–52.0)
Hemoglobin: 14.3 g/dL (ref 13.0–17.0)
MCHC: 32.6 g/dL (ref 30.0–36.0)
MCV: 88.4 fl (ref 78.0–100.0)
Platelets: 243 K/uL (ref 150.0–400.0)
RBC: 4.98 Mil/uL (ref 4.22–5.81)
RDW: 14 % (ref 11.5–15.5)
WBC: 7.7 K/uL (ref 4.0–10.5)

## 2024-04-17 MED ORDER — SPIRONOLACTONE 25 MG PO TABS: 25.0000 mg | ORAL_TABLET | Freq: Every day | ORAL | 1 refills | Status: AC

## 2024-04-17 MED ORDER — LOSARTAN POTASSIUM 100 MG PO TABS: 100.0000 mg | ORAL_TABLET | Freq: Every day | ORAL | 1 refills | Status: AC

## 2024-04-17 MED ORDER — TIRZEPATIDE 10 MG/0.5ML ~~LOC~~ SOAJ: 10.0000 mg | SUBCUTANEOUS | 1 refills | Status: DC

## 2024-04-17 MED ORDER — CYCLOBENZAPRINE HCL 10 MG PO TABS: ORAL_TABLET | ORAL | 1 refills | Status: AC

## 2024-04-17 MED ORDER — DAPAGLIFLOZIN PROPANEDIOL 10 MG PO TABS: 10.0000 mg | ORAL_TABLET | Freq: Every day | ORAL | 1 refills | Status: AC

## 2024-04-17 MED ORDER — AMLODIPINE BESYLATE 10 MG PO TABS: 10.0000 mg | ORAL_TABLET | Freq: Every day | ORAL | 1 refills | Status: AC

## 2024-04-17 MED ORDER — MONTELUKAST SODIUM 10 MG PO TABS: 10.0000 mg | ORAL_TABLET | Freq: Every day | ORAL | 3 refills | Status: AC

## 2024-04-17 MED ORDER — METFORMIN HCL 1000 MG PO TABS: ORAL_TABLET | ORAL | 1 refills | Status: AC

## 2024-04-17 MED ORDER — RIVAROXABAN 10 MG PO TABS: 10.0000 mg | ORAL_TABLET | Freq: Every day | ORAL | 1 refills | Status: AC

## 2024-04-17 MED ORDER — GABAPENTIN 300 MG PO CAPS: ORAL_CAPSULE | ORAL | 2 refills | Status: AC

## 2024-04-17 NOTE — Progress Notes (Signed)
 Patient ID: Adrian [name], male  DOB: 1961/05/27, 63 y.o.   MRN: [id number] Patient Care Team    Relationship Specialty Notifications Start End  Catherine Charlies LABOR, DO PCP - General Family Medicine  02/06/18   Raford Riggs, MD PCP - Cardiology Cardiology  09/22/21   Darlean Ozell NOVAK, MD Consulting Physician Pulmonary Disease  02/07/18   Elspeth Lauraine DEL, OD Referring Physician   02/07/18   San Sandor GAILS, DO Consulting Physician Gastroenterology  01/20/19     Chief Complaint  Patient presents with   Diabetes    Chronic condition management Influenza vaccine    Subjective: Adrian [name] is a 63 y.o. male present for Chronic Conditions/illness Management All past medical history, surgical history, allergies, family history, immunizations, medications and social history were updated in the electronic medical record today. All recent labs, ED visits and hospitalizations within the last year were reviewed.  Essential hypertension/hyperlipidemia/morbid obesity/CKD3 Pt reports compliance with losartan  100 mg QD, amlodipine  10 mg and HCTZ 25 mg daily. Patient denies chest pain, shortness of breath, dizziness or lower extremity edema.   Pt is not taking a daily baby ASA. Pt is not prescribed statin 2/2 intolerance x2. Compliance with zeita and repatha  tolerating. Diet: low Salt Exercise: Routine exercise RF: Hypertension, hyperlipidemia, obesity, diabetes.    Type 2 diabetes mellitus without complication, without long-term current use of insulin (HCC) Patient reports compliance with metformin  1000 mg daily daily, farxiga  10 mg every day, mounjaro  7.5 weekly tolerating- mild nausea, but he does not think it is mounjaro  causing Patient denies dizziness, hyperglycemic or hypoglycemic events. Patient denies numbness, tingling in the extremities or nonhealing wounds of feet.  He states he has cut back on his sugar consumption.  He is prescribed gabapentin   300 mg nightly and reports it is very  helpful-mostly for back/leg pain. Diabetes diagnosed 2019  Asthma: Patient reports his asthma has been rather stable. Not needing albuterol  breakthrough.  compliant with Pulmicort  and Singulair  nightly. He is now taking a daily antihistamine.       04/17/2024    1:20 PM 03/22/2023   11:24 AM 06/12/2022    9:32 AM 06/08/2021    9:51 AM 05/17/2021    8:57 AM  Depression screen PHQ 2/9  Decreased Interest 0 0 0 0 0  Down, Depressed, Hopeless 0 0 0 0 0  PHQ - 2 Score 0 0 0 0 0  Altered sleeping 0 0     Tired, decreased energy 0 0     Change in appetite 0 0     Feeling bad or failure about yourself  0 0     Trouble concentrating 0 0     Moving slowly or fidgety/restless 0 0     Suicidal thoughts 0 0     PHQ-9 Score 0 0     Difficult doing work/chores Not difficult at all Not difficult at all         04/17/2024    1:20 PM 03/22/2023   11:24 AM  GAD 7 : Generalized Anxiety Score  Nervous, Anxious, on Edge 0 0  Control/stop worrying 0 0  Worry too much - different things 0 0  Trouble relaxing 0 0  Restless 0 0  Easily annoyed or irritable 0 0  Afraid - awful might happen 0 0  Total GAD 7 Score 0 0  Anxiety Difficulty Not difficult at all Not difficult at all         04/17/2024  1:20 PM 03/22/2023   11:24 AM 04/07/2020    6:21 PM 08/19/2018   12:00 PM 04/23/2018    6:06 PM  Fall Risk   Falls in the past year? 0 0 0 0  No   Number falls in past yr: 0 0 0    Injury with Fall? 0 0 0    Risk for fall due to :  No Fall Risks     Follow up Falls evaluation completed Falls evaluation completed Falls evaluation completed        Data saved with a previous flowsheet row definition    Immunization History  Administered Date(s) Administered   Influenza Split 05/03/2009, 05/19/2010, 04/27/2011, 05/02/2012, 05/18/2013   Influenza Whole 04/22/2017   Influenza, Seasonal, Injecte, Preservative Fre 05/14/2023, 04/17/2024   Influenza,inj,Quad PF,6+ Mos 05/20/2015, 05/17/2016,  04/24/2017, 05/12/2018, 05/15/2019, 04/06/2020, 06/08/2021, 06/12/2022   Influenza,inj,quad, With Preservative 05/12/2014, 05/20/2015   Moderna Sars-Covid-2 Vaccination 11/23/2019, 12/28/2019, 07/30/2020   PNEUMOCOCCAL CONJUGATE-20 01/03/2024   Pneumococcal Conjugate-13 05/12/2018   Pneumococcal Polysaccharide-23 02/02/2017   Td 07/24/2003   Tdap 11/13/2010, 06/04/2019   Zoster Recombinant(Shingrix) 06/04/2019, 10/27/2019   Past Medical History:  Diagnosis Date   Adenomatous colon polyp    FS 02/2004; colo 2005 hyperplastic polyp; 2010 neg - butmucosal prolapse.    Allergy    Asthma    Atypical chest pain 05/19/2021   Blood in stool    CAD in native artery 07/28/2021   Chicken pox    Colon polyps    Diabetes mellitus without complication (HCC) 01/2017   Diverticulosis    Frequent headaches    Genital warts    GERD (gastroesophageal reflux disease)    Hemorrhoid    Hepatic steatosis 04/2019   Elevated liver enzymes-full work-up for infectious disease process with ultrasound normal with the exception of hepatic steatosis   Hypercalcemia 05/17/2021   Hyperlipidemia    Hypertension    Hypogonadism in male    labs x2: 11/14/2015 - 216 and 11/17/2015 193; declined med at that time.    Insomnia    OSA on CPAP 09/18/2016   HST 09/18/2016; ESS-4; AHI-76; O2 min- 61%   Pulmonary embolism (HCC) 01/2017   provoked by long care ride, on xarelto , est Dr. Darlean   Thyroid  disease    subclinical hypothyroidism per records   Allergies  Allergen Reactions   Atorvastatin     myalgias   Crestor [Rosuvastatin]      severe foot pains   Lisinopril Cough   Statins Other (See Comments)    Causes pain/ foot trouble   Past Surgical History:  Procedure Laterality Date   ANKLE SURGERY     COLONOSCOPY  2016   dr donnald    COLONOSCOPY WITH PROPOFOL  N/A 01/03/2022   Procedure: COLONOSCOPY WITH PROPOFOL ;  Surgeon: San Sandor GAILS, DO;  Location: [facility];  Service: Gastroenterology;   Laterality: N/A;   ESOPHAGOGASTRODUODENOSCOPY     with dr donnald    POLYPECTOMY  01/03/2022   Procedure: POLYPECTOMY;  Surgeon: San Sandor GAILS, DO;  Location: [facility];  Service: Gastroenterology;;   Family History  Problem Relation Age of Onset   Asthma Mother    Arthritis Mother    Asthma Sister    Asthma Brother    Allergies Brother    Alcohol abuse Maternal Grandmother    Cancer Maternal Grandmother    Hiatal hernia Maternal Grandfather    Colon cancer Neg Hx    Esophageal cancer Neg Hx    Social  History   Social History Narrative   Marital status/children/pets: Married.   Education/employment: Associates degree, employed as a Tax adviser:      -Wears a bicycle helmet riding a bike: No     -smoke alarm in the home:Yes     - wears seatbelt: Yes     - Feels safe in their relationships: Yes    Allergies as of 04/17/2024       Reactions   Atorvastatin    myalgias   Crestor [rosuvastatin]     severe foot pains   Lisinopril Cough   Statins Other (See Comments)   Causes pain/ foot trouble        Medication List        Accurate as of April 17, 2024  1:48 PM. If you have any questions, ask your nurse or doctor.          STOP taking these medications    doxycycline  100 MG tablet Commonly known as: VIBRA -TABS Stopped by: Yvan Dority   Mounjaro  5 MG/0.5ML Pen Generic drug: tirzepatide  Replaced by: tirzepatide  10 MG/0.5ML Pen You also have another medication with the same name that you need to continue taking as instructed. Stopped by: Charlies Bellini       TAKE these medications    albuterol  108 (90 Base) MCG/ACT inhaler Commonly known as: VENTOLIN  HFA Inhale 1-2 puffs into the lungs every 6 (six) hours as needed for wheezing or shortness of breath.   amLODipine  10 MG tablet Commonly known as: NORVASC  Take 1 tablet (10 mg total) by mouth daily.   cetirizine  10 MG tablet Commonly known as: ZYRTEC  Take 1 tablet (10 mg  total) by mouth daily.   cyclobenzaprine  10 MG tablet Commonly known as: FLEXERIL  TAKE 1 TABLET BY MOUTH EVERYDAY AT BEDTIME AS NEEDED   dapagliflozin  propanediol 10 MG Tabs tablet Commonly known as: Farxiga  Take 1 tablet (10 mg total) by mouth daily before breakfast.   ezetimibe  10 MG tablet Commonly known as: ZETIA  Take 1 tablet (10 mg total) by mouth daily.   fluticasone  50 MCG/ACT nasal spray Commonly known as: FLONASE  Place 2 sprays into both nostrils at bedtime.   gabapentin  300 MG capsule Commonly known as: NEURONTIN  TAKE 1 CAPSULE BY MOUTH EVERYDAY AT BEDTIME   losartan  100 MG tablet Commonly known as: COZAAR  Take 1 tablet (100 mg total) by mouth daily.   metFORMIN  1000 MG tablet Commonly known as: GLUCOPHAGE  TAKE 1 TABLET (1,000 MG TOTAL) BY MOUTH TWICE A DAY WITH FOOD   montelukast  10 MG tablet Commonly known as: SINGULAIR  Take 1 tablet (10 mg total) by mouth at bedtime.   Repatha  140 MG/ML Sosy Generic drug: Evolocumab  Inject 140 mg into the skin every 14 (fourteen) days.   rivaroxaban  10 MG Tabs tablet Commonly known as: Xarelto  Take 1 tablet (10 mg total) by mouth daily.   spironolactone  25 MG tablet Commonly known as: ALDACTONE  Take 1 tablet (25 mg total) by mouth daily.   tirzepatide  7.5 MG/0.5ML Pen Commonly known as: MOUNJARO  Inject 7.5 mg into the skin once a week. What changed:  Another medication with the same name was added. Make sure you understand how and when to take each. Another medication with the same name was removed. Continue taking this medication, and follow the directions you see here. Changed by: Charlies Bellini   tirzepatide  10 MG/0.5ML Pen Commonly known as: MOUNJARO  Inject 10 mg into the skin once a week. What changed: You were already taking a  medication with the same name, and this prescription was added. Make sure you understand how and when to take each. Replaces: Mounjaro  5 MG/0.5ML Pen Changed by: Charlies Bellini    UNABLE TO FIND Med Name: CPAP       All past medical history, surgical history, allergies, family history, immunizations andmedications were updated in the EMR today and reviewed under the history and medication portions of their EMR.      CT CORONARY MORPH W/CTA COR W/SCORE W/CA W/CM &/OR WO/CM Addendum Date: 05/30/2021   IMPRESSION: 1. Coronary calcium score of 163. This was 109 percentile for age and sex matched control. 2. Normal coronary origin with right dominance. 3. CAD-RADS 1. Minimal non-obstructive CAD (0-24%). Consider non-atherosclerotic causes of chest pain. Consider preventive therapy and risk factor modification. Kardie Tobb, DO [facility] Electronically Signed   By: Kardie  Tobb D.O.   On: 05/30/2021 14:29   Result Date: 05/30/2021 IMPRESSION: 1.  Aortic Atherosclerosis (ICD10-I70.0). 2. Hepatic steatosis. Electronically Signed: By: Toribio Aye M.D. On: 05/30/2021 13:40    ROS: 14 pt review of systems performed and negative (unless mentioned in an HPI)  Objective: BP 124/79   Pulse 65   Temp 98.3 F (36.8 C)   Wt (!) 335 lb (152 kg)   SpO2 96%   BMI 44.20 kg/m  Physical Exam Vitals and nursing note reviewed.  Constitutional:      General: He is not in acute distress.    Appearance: Normal appearance. He is obese. He is not ill-appearing, toxic-appearing or diaphoretic.  HENT:     Head: Normocephalic and atraumatic.  Eyes:     General: No scleral icterus.       Right eye: No discharge.        Left eye: No discharge.     Extraocular Movements: Extraocular movements intact.     Pupils: Pupils are equal, round, and reactive to light.  Cardiovascular:     Rate and Rhythm: Normal rate and regular rhythm.     Heart sounds: No murmur heard. Pulmonary:     Effort: Pulmonary effort is normal. No respiratory distress.     Breath sounds: Normal breath sounds. No wheezing, rhonchi or rales.  Musculoskeletal:     Cervical back: Neck supple.     Right lower leg: No  edema.     Left lower leg: No edema.  Skin:    General: Skin is warm.     Findings: No rash.  Neurological:     Mental Status: He is alert and oriented to person, place, and time. Mental status is at baseline.  Psychiatric:        Mood and Affect: Mood normal.        Behavior: Behavior normal.        Thought Content: Thought content normal.        Judgment: Judgment normal.    Results for orders placed or performed in visit on 04/17/24 (from the past 48 hours)  POCT glycosylated hemoglobin (Hb A1C)     Status: Abnormal   Collection Time: 04/17/24  1:48 PM  Result Value Ref Range   Hemoglobin A1C 5.7 (A) 4.0 - 5.6 %   HbA1c POC (<> result, manual entry) 5.7 4.0 - 5.6 %   HbA1c, POC (prediabetic range) 5.7 5.7 - 6.4 %   HbA1c, POC (controlled diabetic range) 5.7 0.0 - 7.0 %    Assessment/plan: Adrian [name] is a 63 y.o. male present for chronic condition management Elevated liver  enzymes/Hepatic steatosis -Acute hepatitis and autoimmune hepatitis, HIV>negative -Abdominal ultrasound > hepatic steatosis.  -LFTs have been stable .  -Now on Mounjaro  -CMP collected today  Type 2 diabetes mellitus with hyperlipidemia goal of less than 7. Continue metformin  1000 milligrams to twice daily dosing. Take with meals (was taking before bed, which may have caused his nausea) Continue farxiga  10 mg qd Increase Mounjaro  7.5 mg weekly > 10 mg weekly Statin myalgia> continue zeita and repatha  Continue diet and exercise modifications  PNA series: prevnar 20 updated today Flu shot:administered today(recommneded yearly) Microalbumin: UTD- collected 09/20/2023 Foot exam: Completed 04/17/2024 Eye exam: UTD 08/2023// Dr. Vonda yearly visits A1c: 6.6 >>5.8>>6.3>> 7.2>7.1> 8.9 >7.7> 6.8> 7.2> 6.5 >7.2> 7.1 > 7.5 >mounjaro >6.9>6.1>5.7 collected A1c collected today Wt 353>335 Bmi 46.57> 44.20  Vit d def: Vitd levels utd  Mild persistent asthma without complication/Non-seasonal allergic  rhinitis due to pollen Stable Continue Singulair  10 mg daily.  Continue Zyrtec  10 mg nightly   Chronic thoracic back pain: Stable Continue gabapentin  nightly  Continue Flexeril  10 mg nightly as needed.    OSA w/ CPAP Compliant cpap Now on Mounjaro   H/o PE- chronic anticoagulation- acquired thrombophilia(lifetime) Stable Continue xarelto   Essential hypertension/morbid obesity/HLD< 70 ldl goal/Aortic atherosclerosis/statin intolerant/morbid obesity-morbid obesity Agatston coronary artery calcium score between 100 and 199 (163- 78%) Stable - Goal :130/80 with is DM history.  Continue losartan  100 mg daily. Continue spironolactone  25 mg Continue amlodipine  10 mg daily Continue Zetia  10 mg daily Continue Repatha  injections every 2 weeks.  Statin myopathy Low sodium diet Diet and exercise changes recommended  Influenza vaccine: given today   Return in about 4 months (around 08/03/2024) for Routine chronic condition follow-up, cpe (40 min).    Orders Placed This Encounter  Procedures   Flu vaccine trivalent PF, 6mos and older(Flulaval,Afluria,Fluarix,Fluzone)   Comp Met (CMET)   CBC   POCT glycosylated hemoglobin (Hb A1C)   Meds ordered this encounter  Medications   amLODipine  (NORVASC ) 10 MG tablet    Sig: Take 1 tablet (10 mg total) by mouth daily.    Dispense:  90 tablet    Refill:  1   cyclobenzaprine  (FLEXERIL ) 10 MG tablet    Sig: TAKE 1 TABLET BY MOUTH EVERYDAY AT BEDTIME AS NEEDED    Dispense:  90 tablet    Refill:  1   dapagliflozin  propanediol (FARXIGA ) 10 MG TABS tablet    Sig: Take 1 tablet (10 mg total) by mouth daily before breakfast.    Dispense:  90 tablet    Refill:  1   gabapentin  (NEURONTIN ) 300 MG capsule    Sig: TAKE 1 CAPSULE BY MOUTH EVERYDAY AT BEDTIME    Dispense:  90 capsule    Refill:  2    .   losartan  (COZAAR ) 100 MG tablet    Sig: Take 1 tablet (100 mg total) by mouth daily.    Dispense:  90 tablet    Refill:  1   metFORMIN   (GLUCOPHAGE ) 1000 MG tablet    Sig: TAKE 1 TABLET (1,000 MG TOTAL) BY MOUTH TWICE A DAY WITH FOOD    Dispense:  180 tablet    Refill:  1   montelukast  (SINGULAIR ) 10 MG tablet    Sig: Take 1 tablet (10 mg total) by mouth at bedtime.    Dispense:  90 tablet    Refill:  3   rivaroxaban  (XARELTO ) 10 MG TABS tablet    Sig: Take 1 tablet (10 mg total) by mouth daily.  Dispense:  90 tablet    Refill:  1   spironolactone  (ALDACTONE ) 25 MG tablet    Sig: Take 1 tablet (25 mg total) by mouth daily.    Dispense:  90 tablet    Refill:  1   tirzepatide  (MOUNJARO ) 10 MG/0.5ML Pen    Sig: Inject 10 mg into the skin once a week.    Dispense:  6 mL    Refill:  1   Referral Orders  No referral(s) requested today    Note is dictated utilizing voice recognition software. Although note has been proof read prior to signing, occasional typographical errors still can be missed. If any questions arise, please do not hesitate to call for verification.  Electronically signed by: Charlies Bellini, DO [facility] [place] [facility]

## 2024-04-17 NOTE — Patient Instructions (Addendum)
 Return in about 4 months (around 08/03/2024) for Routine chronic condition follow-up, cpe (40 min).        Great to see you today.  I have refilled the medication(s) we provide.   If labs were collected or images ordered, we will inform you of  results once we have received them and reviewed. We will contact you either by echart message, or telephone call.  Please give ample time to the testing facility, and our office to run,  receive and review results. Please do not call inquiring of results, even if you can see them in your chart. We will contact you as soon as we are able. If it has been over 1 week since the test was completed, and you have not yet heard from us , then please call us .    - echart message- for normal results that have been seen by the patient already.   - telephone call: abnormal results or if patient has not viewed results in their echart.  If a referral to a specialist was entered for you, please call us  in 2 weeks if you have not heard from the specialist office to schedule.

## 2024-04-20 ENCOUNTER — Ambulatory Visit: Payer: Self-pay | Admitting: Family Medicine

## 2024-05-01 DIAGNOSIS — G4733 Obstructive sleep apnea (adult) (pediatric): Secondary | ICD-10-CM | POA: Diagnosis not present

## 2024-05-20 ENCOUNTER — Other Ambulatory Visit (HOSPITAL_COMMUNITY): Payer: Self-pay

## 2024-05-25 ENCOUNTER — Other Ambulatory Visit (HOSPITAL_COMMUNITY): Payer: Self-pay

## 2024-05-29 NOTE — Progress Notes (Signed)
 Adrian [name]                                          MRN: [id number]   05/29/2024   The [facility] Quality Team Specialist reviewed this patient medical record for the purposes of chart review for care gap closure. The following were reviewed: abstraction for care gap closure-kidney health evaluation for diabetes:eGFR  and uACR.    [facility] Quality Team

## 2024-06-01 DIAGNOSIS — G4733 Obstructive sleep apnea (adult) (pediatric): Secondary | ICD-10-CM | POA: Diagnosis not present

## 2024-06-05 ENCOUNTER — Other Ambulatory Visit: Payer: Self-pay | Admitting: Family Medicine

## 2024-06-09 ENCOUNTER — Other Ambulatory Visit: Payer: Self-pay | Admitting: Family Medicine

## 2024-06-09 NOTE — Telephone Encounter (Signed)
 Copied from CRM [id number]. Topic: Clinical - Medication Refill >> Jun 09, 2024  9:11 AM Franky GRADE wrote: Medication:  tirzepatide  10 MG/0.5ML Pen  Has the patient contacted their pharmacy? Yes, they advised patient it was denied.  (Agent: If no, request that the patient contact the pharmacy for the refill. If patient does not wish to contact the pharmacy document the reason why and proceed with request.) (Agent: If yes, when and what did the pharmacy advise?)  This is the patient's preferred pharmacy:  [id number] [place] [place] - [address] [place] [address] [place] [address] [place] [id number] Phone: [phone] Fax: [phone]  Is this the correct pharmacy for this prescription? Yes If no, delete pharmacy and type the correct one.   Has the prescription been filled recently? No  Is the patient out of the medication? Yes  Has the patient been seen for an appointment in the last year OR does the patient have an upcoming appointment? Yes  Can we respond through MyChart? Yes  Agent: Please be advised that Rx refills may take up to 3 business days. We ask that you follow-up with your pharmacy.

## 2024-06-10 ENCOUNTER — Other Ambulatory Visit (HOSPITAL_COMMUNITY): Payer: Self-pay

## 2024-06-10 ENCOUNTER — Telehealth: Payer: Self-pay

## 2024-06-10 NOTE — Telephone Encounter (Signed)
 Pharmacy Patient Advocate Encounter   Received notification from Onbase that prior authorization for tirzepatide  (MOUNJARO ) 7.5 MG/0.5ML Pen  is required/requested.   Insurance verification completed.   The patient is insured through [facility].   Per test claim: PA required; PA submitted to above mentioned insurance via Latent Key/confirmation #/EOC [id number] Status is pending

## 2024-06-10 NOTE — Telephone Encounter (Signed)
 Pharmacy Patient Advocate Encounter  Received notification from [facility] that Prior Authorization for Mounjaro  10MG /0.5ML auto-injectors   has been APPROVED from 06/10/2024 to 06/10/2025   PA #/Case ID/Reference #: [id number]

## 2024-06-11 NOTE — Telephone Encounter (Signed)
 PLEASE BE ADVISED APPROVAL LETTER HAS BEEN SCANNED IN MEDIA OF CHART

## 2024-07-08 DIAGNOSIS — G4733 Obstructive sleep apnea (adult) (pediatric): Secondary | ICD-10-CM | POA: Diagnosis not present

## 2024-07-31 ENCOUNTER — Telehealth: Payer: Self-pay | Admitting: Family Medicine

## 2024-07-31 NOTE — Telephone Encounter (Signed)
 Spoke to pt. He was advised pcp was not in the office today. Pt advised someone will contact him next week.

## 2024-07-31 NOTE — Telephone Encounter (Signed)
 Please advise in PCP absence regarding medication concern.

## 2024-07-31 NOTE — Telephone Encounter (Signed)
 I will defer to pt's PCP

## 2024-07-31 NOTE — Telephone Encounter (Signed)
 Copied from CRM #[id number]. Topic: Clinical - Medication Question >> Jul 31, 2024 10:03 AM Viola F wrote: Reason for CRM: Patient called regarding the tirzepatide  (MOUNJARO ) 10 MG/0.5ML Pen - since increasing to the 10MG  he's having trouble digesting, constipation, diarrhea and upset stomach with gas. He wants to know if the dose should be decreased or should he wait it out and see if symptoms will go away. Please call him at [phone]

## 2024-08-04 ENCOUNTER — Other Ambulatory Visit: Payer: Self-pay | Admitting: Family Medicine

## 2024-08-04 NOTE — Telephone Encounter (Signed)
 Per telephone encounter, patient is requesting a 90 day supply.

## 2024-08-04 NOTE — Telephone Encounter (Unsigned)
 Copied from CRM [id number]. Topic: Clinical - Medication Refill >> Aug 04, 2024 11:06 AM Macario HERO wrote: Medication: tirzepatide  (MOUNJARO ) 10 MG/0.5ML Pen [[id number]] -  (Requesting a 90 day supply)  Has the patient contacted their pharmacy? Yes (Agent: If no, request that the patient contact the pharmacy for the refill. If patient does not wish to contact the pharmacy document the reason why and proceed with request.) (Agent: If yes, when and what did the pharmacy advise?)  This is the patient's preferred pharmacy:  [id number] [place], [place] - [address] [place] [place] [address] [place] [address] Phone: [phone] Fax: [phone]  Is this the correct pharmacy for this prescription? Yes If no, delete pharmacy and type the correct one.   Has the prescription been filled recently? Yes  Is the patient out of the medication? Yes  Has the patient been seen for an appointment in the last year OR does the patient have an upcoming appointment? Yes  Can we respond through MyChart? Yes  Agent: Please be advised that Rx refills may take up to 3 business days. We ask that you follow-up with your pharmacy.

## 2024-08-05 MED ORDER — TIRZEPATIDE 10 MG/0.5ML ~~LOC~~ SOAJ: 10.0000 mg | SUBCUTANEOUS | 0 refills | Status: AC

## 2024-08-17 ENCOUNTER — Encounter: Admitting: Family Medicine

## 2024-09-01 ENCOUNTER — Encounter: Admitting: Family Medicine
